# Patient Record
Sex: Female | Born: 1965 | Race: White | Hispanic: No | Marital: Single | Smoking: Never smoker
Health system: Southern US, Community
[De-identification: ages and names within clinical notes are randomized; demographics above are authoritative.]

## PROBLEM LIST (undated history)

## (undated) DIAGNOSIS — Z8719 Personal history of other diseases of the digestive system: Secondary | ICD-10-CM

## (undated) DIAGNOSIS — T7840XA Allergy, unspecified, initial encounter: Secondary | ICD-10-CM

## (undated) DIAGNOSIS — C4490 Unspecified malignant neoplasm of skin, unspecified: Secondary | ICD-10-CM

## (undated) DIAGNOSIS — K219 Gastro-esophageal reflux disease without esophagitis: Secondary | ICD-10-CM

## (undated) DIAGNOSIS — M519 Unspecified thoracic, thoracolumbar and lumbosacral intervertebral disc disorder: Secondary | ICD-10-CM

## (undated) DIAGNOSIS — E559 Vitamin D deficiency, unspecified: Principal | ICD-10-CM

## (undated) DIAGNOSIS — M9979 Connective tissue and disc stenosis of intervertebral foramina of abdomen and other regions: Secondary | ICD-10-CM

## (undated) DIAGNOSIS — E0590 Thyrotoxicosis, unspecified without thyrotoxic crisis or storm: Secondary | ICD-10-CM

## (undated) DIAGNOSIS — M5116 Intervertebral disc disorders with radiculopathy, lumbar region: Secondary | ICD-10-CM

## (undated) DIAGNOSIS — F40240 Claustrophobia: Secondary | ICD-10-CM

## (undated) DIAGNOSIS — R52 Pain, unspecified: Secondary | ICD-10-CM

## (undated) DIAGNOSIS — M545 Low back pain, unspecified: Secondary | ICD-10-CM

## (undated) DIAGNOSIS — D509 Iron deficiency anemia, unspecified: Principal | ICD-10-CM

## (undated) DIAGNOSIS — E0500 Thyrotoxicosis with diffuse goiter without thyrotoxic crisis or storm: Secondary | ICD-10-CM

## (undated) DIAGNOSIS — M5416 Radiculopathy, lumbar region: Secondary | ICD-10-CM

## (undated) DIAGNOSIS — W19XXXA Unspecified fall, initial encounter: Principal | ICD-10-CM

## (undated) DIAGNOSIS — M5126 Other intervertebral disc displacement, lumbar region: Secondary | ICD-10-CM

## (undated) DIAGNOSIS — R319 Hematuria, unspecified: Secondary | ICD-10-CM

## (undated) DIAGNOSIS — N390 Urinary tract infection, site not specified: Secondary | ICD-10-CM

## (undated) DIAGNOSIS — S0990XA Unspecified injury of head, initial encounter: Secondary | ICD-10-CM

## (undated) DIAGNOSIS — I10 Essential (primary) hypertension: Secondary | ICD-10-CM

## (undated) HISTORY — DX: Personal history of other diseases of the digestive system: Z87.19

## (undated) HISTORY — PX: CHOLECYSTECTOMY: SHX55

## (undated) HISTORY — DX: Allergy, unspecified, initial encounter: T78.40XA

## (undated) HISTORY — DX: Unspecified malignant neoplasm of skin, unspecified: C44.90

## (undated) HISTORY — DX: Gastro-esophageal reflux disease without esophagitis: K21.9

## (undated) LAB — EKG 12-LEAD

---

## 2009-10-10 LAB — URINALYSIS WITH MICROSCOPIC
Glucose, UA: NEGATIVE mg/dL
Leukocyte Esterase, Urine: NEGATIVE
Nitrite, Urine: NEGATIVE
RBC, UA: >20 /hpf — AB (ref <2)
Specific Gravity, UA: >=1.03 (ref <=1.035)
Urobilinogen, Urine: 0.2 U/dL (ref 0.2–1.0)
pH, UA: 5.5 (ref 5.0–8.5)

## 2009-10-10 LAB — LACTIC ACID: Lactic Acid: 1.3 mmol/L (ref 0.5–2.2)

## 2009-10-10 LAB — HEPATIC FUNCTION PANEL
ALT: 14 U/L (ref 10–49)
AST: 26 U/L (ref 0–33)
Albumin: 4.4 g/dL (ref 3.2–4.8)
Alkaline Phosphatase: 88 U/L (ref 45–129)
Bilirubin, Direct: 0.1 mg/dL (ref 0.0–0.2)
Total Bilirubin: 0.4 mg/dL (ref 0.3–1.2)
Total Protein: 7.5 g/dL (ref 5.7–8.2)

## 2009-10-10 LAB — BASIC METABOLIC PANEL
BUN: 14 mg/dL (ref 6–20)
CO2: 19 mmol/L — ABNORMAL LOW (ref 20–31)
Calcium: 9 mg/dL (ref 8.6–10.5)
Chloride: 104 mmol/L (ref 99–109)
Creatinine: 0.5 mg/dL (ref 0.5–1.1)
Glucose: 148 mg/dL — ABNORMAL HIGH (ref 70–110)
Potassium: 2.9 mmol/L — ABNORMAL LOW (ref 3.5–5.5)
Sodium: 135 mmol/L (ref 132–146)

## 2009-10-10 LAB — CBC
Hematocrit: 42.1 % (ref 34.0–48.0)
Hemoglobin: 13.8 g/dL (ref 11.5–15.5)
MCH: 27.3 pg (ref 26.0–35.0)
MCHC: 32.8 % (ref 32.0–34.5)
MCV: 83.3 fL (ref 80.0–99.9)
MPV: 8.5 fL (ref 7.0–12.0)
Platelets: 304 E9/L (ref 130–450)
RBC: 5.06 E12/L (ref 3.50–5.50)
RDW: 13.4 fL (ref 11.5–15.0)
WBC: 6.2 E9/L (ref 4.5–11.5)

## 2009-10-10 LAB — SERUM DRUG SCREEN
Acetaminophen Level: <1.2 ug/mL — ABNORMAL LOW (ref 10.0–30.0)
Ethanol Lvl: <10 mg/dL
Salicylate Lvl: <2 mg/dL (ref 0–29)
TCA Scrn: NOT DETECTED ng/mL (ref ?–300)

## 2009-10-10 LAB — MULTI DRUG SCREEN, URINE
Amphetamines: NOT DETECTED ng/mL
Barbiturates: NOT DETECTED ng/mL (ref ?–200)
Benzodiazepines: NOT DETECTED ng/mL (ref ?–200)
Cannabinoids: NOT DETECTED ng/mL
Cocaine Metabolite: NOT DETECTED ng/mL (ref ?–300)
Methadone: NOT DETECTED ng/mL (ref ?–300)
Opiates: NOT DETECTED ng/mL (ref ?–300)
Phencyclidine: NOT DETECTED ng/mL
Propoxyphene: NOT DETECTED ng/mL (ref ?–300)

## 2009-10-10 LAB — AMYLASE: Amylase: 48 U/L (ref 30–118)

## 2009-10-10 LAB — PREGNANCY, URINE: Pregnancy, Urine: NEGATIVE

## 2009-10-10 LAB — ICTOTEST, URINE: Ictotest: NEGATIVE

## 2009-10-10 LAB — GFR CALCULATED: Gfr Calculated: >60 mL/min/{1.73_m2} (ref >=60)

## 2009-10-10 LAB — LIPASE: Lipase: 38 U/L (ref 6–51)

## 2009-10-11 LAB — PREGNANCY-SERUM-QUAL: Preg, Serum: NEGATIVE

## 2010-03-02 LAB — COMPREHENSIVE METABOLIC PANEL
ALT: 16 U/L (ref 10–49)
AST: 24 U/L (ref 0–33)
Albumin: 3.8 g/dL (ref 3.2–4.8)
Alkaline Phosphatase: 112 U/L (ref 45–129)
BUN: 6 mg/dL (ref 6–20)
Bilirubin, Total: 0.3 mg/dL (ref 0.3–1.2)
CO2: 23 mmol/L (ref 20–31)
Calcium: 8.2 mg/dL — ABNORMAL LOW (ref 8.6–10.5)
Chloride: 107 mmol/L (ref 99–109)
Creatinine: 0.5 mg/dL (ref 0.5–1.1)
Glucose: 92 mg/dL (ref 70–110)
Potassium: 3.7 mmol/L (ref 3.5–5.5)
Sodium: 138 mmol/L (ref 132–146)
Total Protein: 6.7 g/dL (ref 5.7–8.2)

## 2010-03-02 LAB — CBC
HCT: 23.9 % — ABNORMAL LOW (ref 34.0–48.0)
HGB: 8.2 g/dL — ABNORMAL LOW (ref 11.5–15.5)
MCH: 29.3 pg (ref 26.0–35.0)
MCHC: 34.2 % (ref 32.0–34.5)
MCV: 85.7 fL (ref 80.0–99.9)
MPV: 7 fL (ref 7.0–12.0)
Platelets: 354 E9/L (ref 130–450)
RBC: 2.79 E12/L — ABNORMAL LOW (ref 3.50–5.50)
RDW: 14.2 fL (ref 11.5–15.0)
WBC: 6.5 E9/L (ref 4.5–11.5)

## 2010-03-02 LAB — GFR CALCULATED: Gfr Calculated: >60 mL/min/{1.73_m2} (ref >=60)

## 2010-03-06 ENCOUNTER — Inpatient Hospital Stay: Admit: 2010-03-06 | Discharge: 2010-03-07 | Disposition: A | Discharge status: Home / Self Care

## 2010-03-06 NOTE — ED Provider Notes (Signed)
Patient is a 44 y.o. female presenting with cough.   Cough  This is a new problem. The current episode started more than 2 days ago. The problem occurs hourly. The problem has not changed since onset.The cough is productive of sputum. The maximum temperature recorded prior to her arrival was 102 to 102.9 F. The fever has been present for 3 to 4 days. Associated symptoms include chills. Pertinent negatives include no chest pain, no ear pain, no headaches, no sore throat, no shortness of breath, no wheezing and no eye redness. Treatments tried: on Levaquin 500mg  daily for 4 days no improvement. The treatment provided no relief. She is a smoker.       Review of Systems   Constitutional: Positive for fever and chills.   HENT: Positive for congestion. Negative for ear pain, sore throat and sinus pressure.    Eyes: Negative for pain, discharge and redness.   Respiratory: Positive for cough. Negative for shortness of breath and wheezing.    Cardiovascular: Negative for chest pain.   Gastrointestinal: Negative for nausea, vomiting, diarrhea and abdominal distention.   Genitourinary: Negative for dysuria and frequency.   Musculoskeletal: Negative for back pain and arthralgias.   Skin: Negative for rash and wound.   Neurological: Negative for weakness and headaches.   Hematological: Negative for adenopathy.   All other systems reviewed and are negative.        Physical Exam   Nursing note and vitals reviewed.  Constitutional: She is oriented to person, place, and time. She appears well-developed and well-nourished.   HENT:   Head: Normocephalic and atraumatic.   Eyes: Pupils are equal, round, and reactive to light.   Neck: Normal range of motion. Neck supple.   Cardiovascular: Regular rhythm and normal heart sounds.    No murmur heard.  Pulmonary/Chest: Effort normal and breath sounds normal.   Abdominal: Soft. Bowel sounds are normal. No tenderness. She has no rebound and no guarding.   Musculoskeletal: She exhibits no  edema.   Neurological: She is alert and oriented to person, place, and time.   Skin: Skin is warm and dry.       Procedures    MDM    Labs      Radiology       EKG Interpretation      Hcg positive; pt has miscarriage Aug 10, ok for CTA CT    --------------------------------------------- PAST HISTORY ---------------------------------------------  Past Medical History: has no past medical history on file.    Past Surgical History: has past surgical history that includes Thyroid surgery and Cholecystectomy.    Social History:  reports that she has been smoking.  She does not have any smokeless tobacco history on file.  She reports that she does not currently drink alcohol or use illicit drugs.    Family History: family history is not on file.     The patient???s home medications have been reviewed.    Allergies: Codeine    -------------------------------------------------- RESULTS -------------------------------------------------    LABS:  Results for orders placed during the hospital encounter of 03/06/10   CBC WITH AUTO DIFFERENTIAL       Component Value Range    WBC 8.3  4.5 - 11.5 (E9/L)    RBC 2.73 (*) 3.50 - 5.50 (E12/L)    HGB 7.8 (*) 11.5 - 15.5 (g/dL)    HCT 56.4 (*) 33.2 - 48.0 (%)    MCV 84.5  80.0 - 99.9 (fL)    MCH 28.6  26.0 - 35.0 (pg)    MCHC 33.9  32.0 - 34.5 (%)    RDW 14.0  11.5 - 15.0 (fL)    Platelets 311  130 - 450 (E9/L)    MPV 7.2  7.0 - 12.0 (fL)    Absolute Neut # 6.76  1.80 - 7.30 (E9/L)    Absolute Lymph # 1.17 (*) 1.50 - 4.00 (E9/L)    Absolute Mono # 0.38  0.10 - 0.95 (E9/L)    Absolute Eos # 0.00 (*) 0.05 - 0.50 (E9/L)    Absolute Baso # 0.01  0.00 - 0.20 (E9/L)    Seg Neutrophils 81 (*) 43 - 80 (%)    Lymphocytes 14 (*) 20 - 42 (%)    Monocytes 5  2 - 12 (%)    Eosinophils 0  0 - 6 (%)    Basophils 0  0 - 2 (%)   COMPREHENSIVE METABOLIC PANEL       Component Value Range    Sodium 130 (*) 132 - 146 (mmol/L)    Potassium 2.6 (*) 3.5 - 5.5 (mmol/L)    Chloride 99  99 - 109 (mmol/L)    CO2 21   20 - 31 (mmol/L)    Glucose 114 (*) 70 - 110 (mg/dL)    Bun 6  6 - 20 (mg/dL)    Creatinine 0.5  0.5 - 1.1 (mg/dL)    Calcium 7.8 (*) 8.6 - 10.5 (mg/dL)    Total Protein 6.5  5.7 - 8.2 (g/dL)    Albumin 3.6  3.2 - 4.8 (g/dL)    Alkaline Phosphatase 82  45 - 129 (U/L)    AST 19  0 - 33 (U/L)    Bilirubin, Total 0.4  0.3 - 1.2 (mg/dL)    ALT 9 (*) 10 - 49 (U/L)    Hemolysis NONE     AMYLASE       Component Value Range    Amylase 37  30 - 118 (U/L)   LIPASE       Component Value Range    Lipase 25  6 - 51 (U/L)   D-DIMER, QUANTITATIVE       Component Value Range    D-Dimer, Quant 250-500 (*)    LACTIC ACID, PLASMA       Component Value Range    Lactic Acid 1.5  0.5 - 2.2 (mmol/L)   PROTIME-INR       Component Value Range    Protime 17.6 (*) 9.8 - 12.5 (secs.)    INR 2.2     APTT       Component Value Range    PTT 40.1 (*) 23.3 - 32.4 (secs.)   URINALYSIS WITH MICROSCOPIC       Component Value Range    Color, UA YELLOW  YELLOW     Clarity, UA SL CLOUDY  CLEAR     Glucose, UA NEGATIVE  NEGATIVE (mg/dL)    Bilirubin, UA NEGATIVE  NEGATIVE     Ketones, UA NEGATIVE  NEGATIVE (mg/dL)    Blood, UA LARGE (*) NEGATIVE     pH, UA 6.0  5.0 - 8.5     Protein, UA NEGATIVE  NEGATIVE (mg/dL)    Urobilinogen, UA 0.2  0.2 - 1.0 (E.U./dL)    Nitrite, UA NEGATIVE  NEGATIVE     Leukocyte Esterase UA LARGE (*) NEGATIVE     WBC, UA >20 (*) <5 (/hpf)    RBC, UA 1-3  <2 (/hpf)  Bacteria, UA MODERATE (*) NONE (/hpf)    Amorphous, UA FEW      Squam Epithel, UA MODERATE     PREGNANCY, URINE       Component Value Range    Preg Test, Ur POSITIVE  NEGATIVE     Specific Gravity, UA 1.020  <=1.035    GFR CALCULATED       Component Value Range    Gfr Calculated >60  >=60 (ml/mn/1.73)       RADIOLOGY:  XR CHEST PA AND LATERAL    Final Result:        CT ANGIOGRAM CHEST W WO CONTRAST    Final Result:        XR CHEST PA OR AP    (Results Pending)       EKG:  This EKG is signed and interpreted by me.    Rate:   Rhythm: Sinus  Interpretation: non-specific  EKG  Comparison: was normal      ------------------------- NURSING NOTES AND VITALS REVIEWED ---------------------------   The nursing notes within the ED encounter and vital signs as below have been reviewed.   BP 128/68   Pulse 82   Temp(Src) 98.3 ??F (36.8 ??C) (Oral)   Resp 16   Ht 5\' 2"  (1.575 m)   Wt 150 lb (68.04 kg)   BMI 27.44 kg/m2   SpO2 97%   LMP 03/06/2010  Oxygen Saturation Interpretation: Normal      ------------------------------------------ PROGRESS NOTES ------------------------------------------     Consultations:      Counseling:   I have spoken with the patient and discussed today???s results, in addition to providing specific details for the plan of care and counseling regarding the diagnosis and prognosis.  Their questions are answered at this time and they are agreeable with the plan.      --------------------------------- ADDITIONAL PROVIDER NOTES ---------------------------------         This patient's ED course included: a personal history and physicial eaxmination and re-evaluation prior to disposition    This patient has remained hemodynamically stable and been closely monitored during their ED course.    Critical Care:     Orpha Bur, DO  03/07/10 [phone]

## 2010-03-07 LAB — COMPREHENSIVE METABOLIC PANEL
ALT: 9 U/L — ABNORMAL LOW (ref 10–49)
AST: 19 U/L (ref 0–33)
Albumin: 3.6 g/dL (ref 3.2–4.8)
Alkaline Phosphatase: 82 U/L (ref 45–129)
BUN: 6 mg/dL (ref 6–20)
Bilirubin, Total: 0.4 mg/dL (ref 0.3–1.2)
CO2: 21 mmol/L (ref 20–31)
Calcium: 7.8 mg/dL — ABNORMAL LOW (ref 8.6–10.5)
Chloride: 99 mmol/L (ref 99–109)
Creatinine: 0.5 mg/dL (ref 0.5–1.1)
Glucose: 114 mg/dL — ABNORMAL HIGH (ref 70–110)
Potassium: 2.6 mmol/L — CL (ref 3.5–5.5)
Sodium: 130 mmol/L — ABNORMAL LOW (ref 132–146)
Total Protein: 6.5 g/dL (ref 5.7–8.2)

## 2010-03-07 LAB — GFR CALCULATED: Gfr Calculated: >60 mL/min/{1.73_m2} (ref >=60)

## 2010-03-07 LAB — URINALYSIS WITH MICROSCOPIC
Bilirubin, Urine: NEGATIVE
Glucose, UA: NEGATIVE mg/dL
Ketones, Urine: NEGATIVE mg/dL
Nitrite, Urine: NEGATIVE
Protein, UA: NEGATIVE mg/dL
Urobilinogen, Urine: 0.2 E.U./dL (ref 0.2–1.0)
WBC, UA: >20 /hpf — AB (ref <5)
pH, UA: 6 (ref 5.0–8.5)

## 2010-03-07 LAB — CBC WITH AUTO DIFFERENTIAL
Absolute Baso #: 0.01 E9/L (ref 0.00–0.20)
Absolute Eos #: 0 E9/L — ABNORMAL LOW (ref 0.05–0.50)
Absolute Lymph #: 1.17 E9/L — ABNORMAL LOW (ref 1.50–4.00)
Absolute Mono #: 0.38 E9/L (ref 0.10–0.95)
Absolute Neut #: 6.76 E9/L (ref 1.80–7.30)
Basophils: 0 % (ref 0–2)
Eosinophils %: 0 % (ref 0–6)
HCT: 23.1 % — ABNORMAL LOW (ref 34.0–48.0)
HGB: 7.8 g/dL — ABNORMAL LOW (ref 11.5–15.5)
Lymphocytes: 14 % — ABNORMAL LOW (ref 20–42)
MCH: 28.6 pg (ref 26.0–35.0)
MCHC: 33.9 % (ref 32.0–34.5)
MCV: 84.5 fL (ref 80.0–99.9)
MPV: 7.2 fL (ref 7.0–12.0)
Monocytes: 5 % (ref 2–12)
Platelets: 311 E9/L (ref 130–450)
RBC: 2.73 E12/L — ABNORMAL LOW (ref 3.50–5.50)
RDW: 14 fL (ref 11.5–15.0)
Seg Neutrophils: 81 % — ABNORMAL HIGH (ref 43–80)
WBC: 8.3 E9/L (ref 4.5–11.5)

## 2010-03-07 LAB — PROTIME-INR
INR: 2.2
Protime: 17.6 secs. — ABNORMAL HIGH (ref 9.8–12.5)

## 2010-03-07 LAB — LACTIC ACID: Lactic Acid: 1.5 mmol/L (ref 0.5–2.2)

## 2010-03-07 LAB — LIPASE: Lipase: 25 U/L (ref 6–51)

## 2010-03-07 LAB — D-DIMER, QUANTITATIVE

## 2010-03-07 LAB — PREGNANCY, URINE
Pregnancy, Urine: POSITIVE
Specific Gravity, UA: 1.02 (ref <=1.035)

## 2010-03-07 LAB — APTT: PTT: 40.1 secs. — ABNORMAL HIGH (ref 23.3–32.4)

## 2010-03-07 LAB — AMYLASE: Amylase: 37 U/L (ref 30–118)

## 2010-03-07 MED ORDER — SODIUM CHLORIDE 0.9 % IV SOLN
0.9 % | Freq: Once | INTRAVENOUS | Status: DC
Start: 2010-03-07 — End: 2010-03-07
  Administered 2010-03-07 (×2): via INTRAVENOUS

## 2010-03-07 MED ORDER — ACETAMINOPHEN 325 MG PO TABS
325 MG | Freq: Once | ORAL | Status: AC
Start: 2010-03-07 — End: 2010-03-06
  Administered 2010-03-07: 02:00:00 via ORAL

## 2010-03-07 MED ORDER — IPRATROPIUM-ALBUTEROL 0.5-2.5 (3) MG/3ML IN SOLN
Freq: Once | RESPIRATORY_TRACT | Status: AC
Start: 2010-03-07 — End: 2010-03-06
  Administered 2010-03-07: 02:00:00 via RESPIRATORY_TRACT

## 2010-03-07 MED ORDER — POTASSIUM CHLORIDE CRYS ER 10 MEQ PO TBCR
10 MEQ | Freq: Once | ORAL | Status: AC
Start: 2010-03-07 — End: 2010-03-07
  Administered 2010-03-07: 04:00:00 via ORAL

## 2010-03-07 MED ORDER — IOVERSOL 68 % IV SOLN
68 % | Freq: Once | INTRAVENOUS | Status: AC | PRN
Start: 2010-03-07 — End: 2010-03-07
  Administered 2010-03-07: 07:00:00 via INTRAVENOUS

## 2010-03-07 MED ADMIN — vancomycin 1000 mg IVPB in D5W 250ml. addavial: INTRAVENOUS | @ 04:00:00 | NDC 23360015250

## 2010-03-07 MED ADMIN — cefepime (MAXIPIME) 2 g IVPB minibag: INTRAVENOUS | @ 02:00:00 | NDC 60505068104

## 2010-03-07 MED FILL — SODIUM CHLORIDE 0.9 % IJ SOLN: 0.9 % | INTRAMUSCULAR | Qty: 20

## 2010-03-07 MED FILL — IPRATROPIUM-ALBUTEROL 0.5-2.5 (3) MG/3ML IN SOLN: RESPIRATORY_TRACT | Qty: 3

## 2010-03-07 MED FILL — KLOR-CON M10 10 MEQ PO TBCR: 10 MEQ | ORAL | Qty: 4

## 2010-03-07 MED FILL — SODIUM CHLORIDE 0.9 % IV SOLN: 0.9 % | INTRAVENOUS | Qty: 1000

## 2010-03-07 MED FILL — VANCOMYCIN HCL 1000 MG IV SOLR: 1000 MG | INTRAVENOUS | Qty: 1

## 2010-03-07 MED FILL — MAPAP 325 MG PO TABS: 325 MG | ORAL | Qty: 2

## 2010-03-07 MED FILL — CEFAZOLIN SODIUM 1 G IJ SOLR: 1 g | INTRAMUSCULAR | Qty: 2000

## 2010-03-07 NOTE — ED Notes (Signed)
Pt to ct      Thurnell Garbe, RN  03/07/10 [phone]

## 2010-03-07 NOTE — ED Notes (Signed)
Pt to ct     Oren Bracket, RN  03/07/10 [phone]

## 2010-03-07 NOTE — Discharge Instructions (Signed)
Pleurisy     Pleurisy is an inflammation and swelling of the lining of the lungs. It usually is the result of an underlying infection or other disease. Because of this inflammation, it hurts to breathe. It is aggravated by coughing or deep breathing. The primary goal in treating pleurisy is to diagnose and treat the condition that caused it.      HOME CARE INSTRUCTIONS   Only take over-the-counter or prescription medicines for pain, discomfort, or fever as directed by your caregiver.    If  medications which kill germs (antibiotics) were prescribed, take the entire course.  Even if you are feeling better, you need to take them.   Use a cool mist vaporizer to help loosen secretions. This is so the secretions can be coughed up more easily.     SEEK MEDICAL CARE IF:   An oral temperature above 102 F (38.9 C) develops, or as your caregiver suggests.    Your pain is not controlled with medication or is increasing.   You have an increase in pus like (purulent) secretions brought up with coughing.  SEEK IMMEDIATE MEDICAL CARE IF:   You have blue or dark lips, fingernails, or toenails.   You begin coughing up blood.   You have increased difficulty breathing.   You have continuing pain unrelieved by medicine or lasting more than one week.   You have pain that radiates into your neck, arms, or jaw.   You develop increased shortness of breath or wheezing.   You develop fever, rash, vomiting, fainting, or other serious complaints.     Document Released: 07/01/2005  Document Re-Released: 12/18/2007  [facility] Patient Information 2011 [place].

## 2010-03-30 DIAGNOSIS — J309 Allergic rhinitis, unspecified: Secondary | ICD-10-CM | POA: Insufficient documentation

## 2010-09-24 LAB — CBC WITH AUTO DIFFERENTIAL
Absolute Baso #: 0.05 E9/L (ref 0.00–0.20)
Absolute Eos #: 0.11 E9/L (ref 0.05–0.50)
Absolute Lymph #: 1.36 E9/L — ABNORMAL LOW (ref 1.50–4.00)
Absolute Mono #: 0.33 E9/L (ref 0.10–0.95)
Absolute Neut #: 3.36 E9/L (ref 1.80–7.30)
Basophils: 1 % (ref 0–2)
Eosinophils %: 2 % (ref 0–6)
HCT: 39.7 % (ref 34.0–48.0)
HGB: 13.2 g/dL (ref 11.5–15.5)
Lymphocytes: 26 % (ref 20–42)
MCH: 28.4 pg (ref 26.0–35.0)
MCHC: 33.3 % (ref 32.0–34.5)
MCV: 85.2 fL (ref 80.0–99.9)
MPV: 8.8 fL (ref 7.0–12.0)
Monocytes: 6 % (ref 2–12)
Platelets: 263 E9/L (ref 130–450)
RBC: 4.66 E12/L (ref 3.50–5.50)
RDW: 15.1 fL — ABNORMAL HIGH (ref 11.5–15.0)
Seg Neutrophils: 65 % (ref 43–80)
WBC: 5.2 E9/L (ref 4.5–11.5)

## 2010-09-24 LAB — MISCELLANEOUS SENDOUT

## 2010-09-24 LAB — ANISOCYTOSIS

## 2010-09-24 LAB — RPR: RPR: NONREACTIVE

## 2010-09-25 LAB — HEPATITIS PANEL, ACUTE
Hep A IgM: NONREACTIVE
Hep B Core Ab, IgM: NONREACTIVE
Hepatitis B Surface Ag: NONREACTIVE
Hepatitis C Ab: NONREACTIVE

## 2010-09-25 LAB — BASIC METABOLIC PANEL
BUN: 10 mg/dL (ref 6–20)
CO2: 28 mmol/L (ref 20–31)
Calcium: 9.3 mg/dL (ref 8.6–10.5)
Chloride: 108 mmol/L (ref 99–109)
Creatinine: 0.5 mg/dL (ref 0.5–1.1)
Glucose: 90 mg/dL (ref 70–110)
Potassium: 3.7 mmol/L (ref 3.5–5.5)
Sodium: 142 mmol/L (ref 132–146)

## 2010-09-25 LAB — LIPID PANEL
Cholesterol: 152 mg/dL (ref 0–199)
HDL: 50 mg/dL (ref >40.0)
LDL Calculated: 78 mg/dL (ref 0–99)
Triglycerides: 120 mg/dL (ref 0–149)

## 2010-09-25 LAB — HEPATIC FUNCTION PANEL
ALT: 13 U/L (ref 10–49)
AST: 22 U/L (ref 0–33)
Albumin: 4.2 g/dL (ref 3.2–4.8)
Alkaline Phosphatase: 122 U/L (ref 45–129)
Bilirubin, Direct: 0.1 mg/dL (ref 0.0–0.2)
Bilirubin, Total: 0.2 mg/dL — ABNORMAL LOW (ref 0.3–1.2)
Total Protein: 7.6 g/dL (ref 5.7–8.2)

## 2010-09-25 LAB — HEPATITIS B SURFACE ANTIBODY: Hep B S Ab: REACTIVE

## 2010-09-25 LAB — TOXOPLASMA GONDII ANTIBODY, IGG

## 2010-09-25 LAB — GFR CALCULATED: Gfr Calculated: >60 mL/min/{1.73_m2} (ref >=60)

## 2010-09-26 LAB — HEPATITIS A ANTIBODY, TOTAL: Hep A Total Ab: NEGATIVE

## 2010-09-26 LAB — VITAMIN D 25 HYDROXY: Vit D, 25-Hydroxy: 11 ng/mL — ABNORMAL LOW (ref 30–80)

## 2010-09-29 LAB — HIV RNA, QUANTITATIVE, PCR

## 2010-12-12 LAB — RPR: RPR: NONREACTIVE

## 2010-12-13 LAB — FTA ANTIBODIES, IGG AND IGM: Syphilis Treponemal Ab: NONREACTIVE

## 2011-01-15 LAB — CBC WITH AUTO DIFFERENTIAL
Absolute Eos #: 0.09 E9/L (ref 0.05–0.50)
Absolute Lymph #: 1.83 E9/L (ref 1.50–4.00)
Absolute Mono #: 0.34 E9/L (ref 0.10–0.95)
Absolute Neut #: 3.97 E9/L (ref 1.80–7.30)
Basophils Absolute: 0.04 E9/L (ref 0.00–0.20)
Basophils: 1 % (ref 0–2)
Eosinophils %: 2 % (ref 0–6)
Hematocrit: 38.9 % (ref 34.0–48.0)
Hemoglobin: 13.2 g/dL (ref 11.5–15.5)
Lymphocytes: 29 % (ref 20–42)
MCH: 27.8 pg (ref 26.0–35.0)
MCHC: 33.9 % (ref 32.0–34.5)
MCV: 82.1 fL (ref 80.0–99.9)
MPV: 8.5 fL (ref 7.0–12.0)
Monocytes: 6 % (ref 2–12)
Platelets: 303 E9/L (ref 130–450)
RBC: 4.74 E12/L (ref 3.50–5.50)
RDW: 16.5 fL — ABNORMAL HIGH (ref 11.5–15.0)
Seg Neutrophils: 63 % (ref 43–80)
WBC: 6.3 E9/L (ref 4.5–11.5)

## 2011-01-15 LAB — HIV RNA, QUANTITATIVE, PCR

## 2011-01-15 LAB — BASIC METABOLIC PANEL
BUN: 10 mg/dL (ref 6–20)
CO2: 24 mmol/L (ref 20–31)
Calcium: 9.1 mg/dL (ref 8.6–10.5)
Chloride: 111 mmol/L — ABNORMAL HIGH (ref 99–109)
Creatinine: 0.6 mg/dL (ref 0.5–1.1)
Glucose: 102 mg/dL (ref 70–110)
Potassium: 3.9 mmol/L (ref 3.5–5.5)
Sodium: 142 mmol/L (ref 132–146)

## 2011-01-15 LAB — HEPATIC FUNCTION PANEL
ALT: 11 U/L (ref 10–49)
AST: 18 U/L (ref 0–33)
Albumin: 4.4 g/dL (ref 3.2–4.8)
Alkaline Phosphatase: 141 U/L — ABNORMAL HIGH (ref 45–129)
Bilirubin, Direct: 0.1 mg/dL (ref 0.0–0.2)
Total Bilirubin: 0.2 mg/dL — ABNORMAL LOW (ref 0.3–1.2)
Total Protein: 7.1 g/dL (ref 5.7–8.2)

## 2011-01-15 LAB — MISCELLANEOUS SENDOUT

## 2011-01-15 LAB — VITAMIN D 25 HYDROX, D2 & D3: Vitamin D2 And D3, Total: 16 ng/mL — ABNORMAL LOW (ref 30–100)

## 2011-01-15 LAB — ANISOCYTOSIS

## 2011-01-15 LAB — GFR CALCULATED: Gfr Calculated: >60 mL/min/{1.73_m2} (ref >=60)

## 2011-02-19 LAB — PREGNANCY-SERUM-QUAL: Preg, Serum: POSITIVE

## 2011-04-08 LAB — POCT URINE QUALITATIVE DIPSTICK GLUCOSE: Glucose, UA POC: NEGATIVE

## 2011-04-08 LAB — POCT URINE QUALITATIVE DIPSTICK PROTEIN: Protein, UA: NEGATIVE

## 2011-04-12 NOTE — Telephone Encounter (Signed)
the patient was called for pappa a results, no answer left message to call office back

## 2011-04-12 NOTE — Telephone Encounter (Signed)
Error

## 2011-04-16 LAB — HIV RNA, QUANTITATIVE, PCR

## 2011-04-16 LAB — MISCELLANEOUS SENDOUT

## 2011-05-20 LAB — POCT URINE QUALITATIVE DIPSTICK PROTEIN: Protein, UA: NEGATIVE

## 2011-05-20 LAB — POCT URINE QUALITATIVE DIPSTICK GLUCOSE: Glucose, UA POC: NEGATIVE

## 2011-05-20 NOTE — Progress Notes (Signed)
Pt unable to void

## 2011-07-03 ENCOUNTER — Inpatient Hospital Stay
Admit: 2011-07-03 | Discharge: 2011-07-03 | Disposition: A | Discharge status: Home / Self Care | Attending: Emergency Medicine

## 2011-07-03 MED ORDER — IPRATROPIUM-ALBUTEROL 0.5-2.5 (3) MG/3ML IN SOLN
Freq: Once | RESPIRATORY_TRACT | Status: AC
Start: 2011-07-03 — End: 2011-07-03
  Administered 2011-07-03: 17:00:00 via RESPIRATORY_TRACT

## 2011-07-03 MED ORDER — AZITHROMYCIN 250 MG PO TABS
250 MG | PACK | ORAL | Status: AC
Start: 2011-07-03 — End: 2011-07-13

## 2011-07-03 MED ORDER — ALBUTEROL SULFATE HFA 108 (90 BASE) MCG/ACT IN AERS
10890 (90 Base) MCG/ACT | RESPIRATORY_TRACT | Status: DC | PRN
Start: 2011-07-03 — End: 2014-11-10

## 2011-07-03 MED FILL — IPRATROPIUM-ALBUTEROL 0.5-2.5 (3) MG/3ML IN SOLN: 0.5-2.5 (3) MG/3ML | RESPIRATORY_TRACT | Qty: 3

## 2011-07-03 NOTE — ED Notes (Signed)
Patient presents with the C/O a persistent cough. Patient states that she was coughing all night and couldn't get any sleep. Patient also states that she is just over 6 months pregnant.     Elvia Collum, RN  07/03/11 [phone]

## 2011-07-03 NOTE — ED Notes (Signed)
The mild wheezing at end of inspiration has cleared up after breathing treatment.     Elvia Collum, RN  07/03/11 [phone]

## 2011-07-03 NOTE — Discharge Instructions (Signed)
Upper Respiratory Infection (URI), Adult  An upper respiratory infection (URI) is also known as the common cold. It is often caused by a virus. Colds are easily spread (contagious). You can pass it to others by touch or by drinking out of the same glass. You may have:   A runny nose.   Sneezing.    Coughing.   A stuffy nose (nasal congestion).   A sinus infection.   A sore throat.   A scratchy voice (hoarseness).    You can also have:   Tiredness (fatigue).   Muscle aches.   A headache.   A mild fever.    Usually, you get well in a week or two. Your doctor will know if you have a URI by talking to you and examining you.  HOME CARE   Inhale heated mist or steam (vaporizer or shower).    Sip chicken soup.    Get plenty of rest.    Use lozenges for throat comfort.    Rinse your mouth (gargle) with warm water or salt water (1/4 teaspoon salt in 8 ounces of water).    Only take medicine as told by your doctor.    Drink enough water and fluids to keep your pee (urine) clear or pale yellow.    Rest as needed.    Return to work when your temperature has returned to normal or as told by your doctor. Use a face mask and wash your hands to stop your cold from spreading.   GET HELP IF:   You have a temperature by mouth above 102 F (38.9 C).    After the first few days, you feel you are getting worse, not better.    You have questions about your medicine.   GET HELP RIGHT AWAY IF:   You have a temperature by mouth above 102 F (38.9 C), not controlled by medicine.    You have a bad or lasting headache, ear pain, sinus pain, or chest pain.    You have trouble breathing or get short of breath.    You have a lasting cough, cough up blood, or have a change in your usual mucus.    You have sore muscles, a stiff neck, or a very bad headache, not controlled with medicine.   MAKE SURE YOU:   Understand these instructions.    Will watch your condition.    Will get help right away if you are not doing  well or get worse.   Document Released: 12/18/2007 Document Re-Released: 09/25/2009  [facility] Patient Information 2012 [place].  Cough  The body has a normal cough reflex. This helps expel mucous secretions and irritants from the lung and airway. Coughing helps to protect you from pneumonia. Most coughs are caused by virus infections. These often take 2 to 3 weeks to clear up. Cough spasms are periods of continuous coughing that go on for several minutes. If a cough can be controlled with medicine, and it clears up in 2 to 3 weeks, no special studies or treatment is usually needed.  A persistent cough lasting longer than 3 to 4 weeks requires medical evaluation. X-rays and other tests may be needed to determine the cause. A chronic cough is most often due to smoking, post-nasal drip from sinus disease, or asthma. Esophageal reflux disease or GERD can also lead to a chronic cough. ACE inhibitor blood pressure drugs may also cause a cough. Some infections like whooping cough can cause a persistent cough that  lasts for weeks.  Treatment of cough includes measures to loosen the cough and thin the mucus. Warm liquids, cough drops, and non prescription cough medicine will help reduce dry hacking cough. Use a humidifier if necessary to moisten the air in your room. Some cough medicines also have antihistamines, decongestants, or alcohol in them, but there is no proof that any of these help control cough.  Cough suppressants may be used as directed by your caregiver. Keep in mind that coughing helps clear mucus and infection out of the respiratory tract. It is best to use cough suppressants only when rest is needed. For children under the age of 4 years, use cough suppressants only as directed by your child's caregiver.  Prescription cough medicine or those with dextromethorphan (DM) should be reserved for dry coughs that prevent sleep or cause spasms or chest pain. Avoid any exposure to cigarette smoke because this  will worsen the cough or make it last much longer.   SEEK IMMEDIATE MEDICAL CARE IF YOU OR YOUR CHILD DEVELOPS:   Increased difficulty breathing.    A high fever or severe chest pain.    A cough which has not improved within 3 weeks.   Document Released: 07/01/2005 Document Re-Released: 09/25/2009  [facility] Patient Information 2012 [place].  Smoking Cessation  This document explains the best ways for you to quit smoking and new treatments to help. It lists new medicines that can double or triple your chances of quitting and quitting for good. It also considers ways to avoid relapses and concerns you may have about quitting, including weight gain.  NICOTINE: A POWERFUL ADDICTION  If you have tried to quit smoking, you know how hard it can be. It is hard because nicotine is a very addictive drug. For some people, it can be as addictive as heroin or cocaine. Usually, people make 2 or 3 tries, or more, before finally being able to quit. Each time you try to quit, you can learn about what helps and what hurts. Quitting takes hard work and a lot of effort, but you can quit smoking.  QUITTING SMOKING IS ONE OF THE MOST IMPORTANT THINGS YOU WILL EVER DO:   You will live longer, feel better, and live better.    The impact on your body of quitting smoking is felt almost immediately:    Within 20 minutes, blood pressure decreases. Pulse returns to its normal level.    After 8 hours, carbon monoxide levels in the blood return to normal. Oxygen level increases.    After 24 hours, chance of heart attack starts to decrease. Breath, hair, and body stop smelling like smoke.    After 48 hours, damaged nerve endings begin to recover. Sense of taste and smell improve.    After 72 hours, the body is virtually free of nicotine. Bronchial tubes relax and breathing becomes easier.    After 2 to 12 weeks, lungs can hold more air. Exercise becomes easier and circulation improves.    Quitting will lower your chance of  having a heart attack, stroke, cancer, or lung disease:    After 1 year, the risk of coronary heart disease is cut in half.    After 5 years, the risk of stroke falls to the same as a nonsmoker.    After 10 years, the risk of lung cancer is cut in half and the risk of other cancers decreases significantly.    After 15 years, the risk of coronary heart disease drops, usually  to the level of a nonsmoker.    If you are pregnant, quitting smoking will improve your chances of having a healthy baby.    The people you live with, especially your children, will be healthier.    You will have extra money to spend on things other than cigarettes.   FIVE KEYS TO QUITTING  Studies have shown that these 5 steps will help you quit smoking and quit for good. You have the best chances of quitting if you use them together:  1. Get ready.   2. Get support and encouragement.   3. Learn new skills and behaviors.   4. Get medicine to reduce your nicotine addiction and use it correctly.   5. Be prepared for relapse or difficult situations. Be determined to continue trying to quit, even if you do not succeed at first.   1. GET READY   Set a quit date.    Change your environment.    Get rid of ALL cigarettes, ashtrays, matches, and lighters in your home, car, and place of work.    Do not let people smoke in your home.    Review your past attempts to quit. Think about what worked and what did not.    Once you quit, do not smoke. NOT EVEN A PUFF!   2. GET SUPPORT AND ENCOURAGEMENT  Studies have shown that you have a better chance of being successful if you have help. You can get support in many ways.   Tell your family, friends, and coworkers that you are going to quit and need their support. Ask them not to smoke around you.    Talk to your caregivers (doctor, dentist, nurse, pharmacist, psychologist, and/or smoking counselor).    Get individual, group, or telephone counseling and support. The more counseling you have, the  better your chances are of quitting. Programs are available at [employer] and health centers. Call your local health department for information about programs in your area.    Spiritual beliefs and practices may help some smokers quit.    Quit meters are Insurance underwriter that keep track of quit statistics, such as amount of "quit-time," cigarettes not smoked, and money saved.    Many smokers find one or more of the many self-help books available useful in helping them quit and stay off tobacco.   3. LEARN NEW SKILLS AND BEHAVIORS   Try to distract yourself from urges to smoke. Talk to someone, go for a walk, or occupy your time with a task.    When you first try to quit, change your routine. Take a different route to work. Drink tea instead of coffee. Eat breakfast in a different place.    Do something to reduce your stress. Take a hot bath, exercise, or read a book.    Plan something enjoyable to do every day. Reward yourself for not smoking.    Explore interactive web-based programs that specialize in helping you quit.   4. GET MEDICINE AND USE IT CORRECTLY  Medicines can help you stop smoking and decrease the urge to smoke. Combining medicine with the above behavioral methods and support can quadruple your chances of successfully quitting smoking.  The [place] Food and Drug Administration (FDA) has approved 7 medicines to help you quit smoking. These medicines fall into 3 categories.   Nicotine replacement therapy (delivers nicotine to your body without the negative effects and risks of smoking):    Nicotine gum: Available over-the-counter.  Nicotine lozenges: Available over-the-counter.    Nicotine inhaler: Available by prescription.    Nicotine nasal spray: Available by prescription.    Nicotine skin patches (transdermal): Available by prescription and over-the-counter.    Antidepressant medicine (helps people abstain from smoking, but how this works is unknown):     Bupropion sustained-release (SR) tablets: Available by prescription.    Nicotinic receptor partial agonist (simulates the effect of nicotine in your brain):    Varenicline tartrate tablets: Available by prescription.    Ask your caregiver for advice about which medicines to use and how to use them. Carefully read the information on the package.    Everyone who is trying to quit may benefit from using a medicine. If you are pregnant or trying to become pregnant, nursing an infant, you are under age 15, or you smoke fewer than 10 cigarettes per day, talk to your caregiver before taking any nicotine replacement medicines.    You should stop using a nicotine replacement product and call your caregiver if you experience nausea, dizziness, weakness, vomiting, fast or irregular heartbeat, mouth problems with the lozenge or gum, or redness or swelling of the skin around the patch that does not go away.    Do not use any other product containing nicotine while using a nicotine replacement product.    Talk to your caregiver before using these products if you have diabetes, heart disease, asthma, stomach ulcers, you had a recent heart attack, you have high blood pressure that is not controlled with medicine, a history of irregular heartbeat, or you have been prescribed medicine to help you quit smoking.   5. BE PREPARED FOR RELAPSE OR DIFFICULT SITUATIONS   Most relapses occur within the first 3 months after quitting. Do not be discouraged if you start smoking again. Remember, most people try several times before they finally quit.    You may have symptoms of withdrawal because your body is used to nicotine. You may crave cigarettes, be irritable, feel very hungry, cough often, get headaches, or have difficulty concentrating.    The withdrawal symptoms are only temporary. They are strongest when you first quit, but they will go away within 10 to 14 days.   Here are some difficult situations to watch  for:   Alcohol. Avoid drinking alcohol. Drinking lowers your chances of successfully quitting.    Caffeine. Try to reduce the amount of caffeine you consume. It also lowers your chances of successfully quitting.    Other smokers. Being around smoking can make you want to smoke. Avoid smokers.    Weight gain. Many smokers will gain weight when they quit, usually less than 10 pounds. Eat a healthy diet and stay active. Do not let weight gain distract you from your main goal, quitting smoking. Some medicines that help you quit smoking may also help delay weight gain. You can always lose the weight gained after you quit.    Bad mood or depression. There are a lot of ways to improve your mood other than smoking.   If you are having problems with any of these situations, talk to your caregiver.  SPECIAL SITUATIONS OR CONDITIONS  Studies suggest that everyone can quit smoking. Your situation or condition can give you a special reason to quit.   Pregnant women/New mothers: By quitting, you protect your baby's health and your own.    Hospitalized patients: By quitting, you reduce health problems and help healing.    Heart attack patients: By quitting, you reduce  your risk of a second heart attack.    Lung, head, and neck cancer patients: By quitting, you reduce your chance of a second cancer.    Parents of children and adolescents: By quitting, you protect your children from illnesses caused by secondhand smoke.   QUESTIONS TO THINK ABOUT  Think about the following questions before you try to stop smoking. You may want to talk about your answers with your caregiver.   Why do you want to quit?    If you tried to quit in the past, what helped and what did not?    What will be the most difficult situations for you after you quit? How will you plan to handle them?    Who can help you through the tough times? Your family? Friends? Caregiver?    What pleasures do you get from smoking? What ways can you still get  pleasure if you quit?   Here are some questions to ask your caregiver:   How can you help me to be successful at quitting?    What medicine do you think would be best for me and how should I take it?    What should I do if I need more help?    What is smoking withdrawal like? How can I get information on withdrawal?   Quitting takes hard work and a lot of effort, but you can quit smoking.  FOR MORE INFORMATION  Smokefree.gov (Inrails.tn) provides free, accurate, evidence-based information and professional assistance to help support the immediate and long-term needs of people trying to quit smoking.  Document Released: 06/25/2001 Document Re-Released: 12/19/2009  [facility] Patient Information 2012 [place].

## 2011-07-03 NOTE — ED Provider Notes (Signed)
HPI: Maria Valdez is a 45 y.o. female with a past medical history of  has a past medical history of HIV infection.presenting with complaints of a cough. The patient states that these symptoms began gradually. The history is obtained from the patient. The patient states that he has had some subjective chills at home. Patient does complain of a mild cough associated with it that is nonproductive. Patient denies excessive fatigue or sleeping greater than 18 hours a day. Patient denies exposure to mononucleosis. The patient denies any abdominal pain, left upper quadrant fullness, or early satiety. The patient also denies difficulty breathing, hemoptysis, neck pain/stiffness, or blurry vision. Sx have persisted and are mildly worse which is what prompted the visit today. no exposure to sick contacts. Patient states her symptoms have been present for the past 3 days. She denies any nausea, vomiting, diarrhea. She does state that she's approximately 6 months pregnant. She denies any abdominal cramping, bleeding or spotting. She states she's had good fetal  Movement.       ROS:   Unless otherwise stated in this report or unable to obtain because of the patient's clinical or mental status as evidenced by the medical record, this patients's positive and negative responses for Review of Systems, constitutional, psych, eyes, ENT, cardiovascular, respiratory, gastrointestinal, neurological, genitourinary, musculoskeletal, integument systems and systems related to the presenting problem are either stated in the preceding or were not pertinent or were negative for the symptoms and/or complaints related to the medical problem.      --------------------------------------------- PAST HISTORY ---------------------------------------------  Past Medical History:  has a past medical history of HIV infection.    Past Surgical History:  has past surgical history that includes Thyroid surgery and Cholecystectomy.    Social History:   reports that she has been smoking.  She has never used smokeless tobacco. She reports that she does not drink alcohol or use illicit drugs.    Family History: family history is not on file.     The patient's home medications have been reviewed.    Allergies: Sulfa drugs and Codeine    ------------------------- NURSING NOTES AND VITALS REVIEWED ---------------------------   The nursing notes within the ED encounter and vital signs as below have been reviewed by myself.  BP 103/52  Pulse 93  Temp(Src) 97.8 F (36.6 C) (Oral)  Resp 18  Ht 5\' 2"  (1.575 m)  Wt 185 lb (83.915 kg)  BMI 33.84 kg/m2  SpO2 98%  LMP 03/06/2010  Breastfeeding? Unknown  Oxygen Saturation Interpretation: Normal    The patient's available past medical records and past encounters were reviewed.              Physical exam:  Constitutional: Vital signs are reviewed the patient is comfortable. The patient is alert and oriented and conversant.  Head: The head is atraumatic and normocephalic.  Eyes: No discharge is present from the eyes. The sclera are normal.  ENT: The oropharynx demonstrates a small amount of erythema bilaterally. There is no tonsillar enlargement nor is there any exudate present. No uvular deviation or edema. No tonsillary asymmetry. Floor of the mouth soft, no trismus, handling secretions. TMs bilaterally demonstrate no evidence of infection.  Neck: Normal range of motion is achieved in the neck. There is no JVD present. No meningeal signs are present Anterior cervical adenopathy is normal.  Respiratory/chest: The chest is nontender. Breath sounds are normal. There is no respiratory distress.she does have expiratory wheezes throughout.  Cardiovascular: Heart shows a regular  rate and rhythm without murmurs clicks or gallops.  Abdominal exam: The abdomen is non tender without evidence of peritoneal signs. Specific attention to the left upper quadrant with palpation of the spleen demonstrates no organomegaly or  tenderness  Skin: warm and dry, without rash  Neurologic: GCS 15  Psych: Normal Affect  -------------------------------------------------- RESULTS -------------------------------------------------    LABS:  No results found for this visit on 07/03/11.    RADIOLOGY:  Interpreted by Radiologist.             ------------------------------ ED COURSE/MEDICAL DECISION MAKING----------------------    Medications   azithromycin (ZITHROMAX Z-PAK) 250 MG tablet (not administered)   albuterol (PROVENTIL HFA) 108 (90 BASE) MCG/ACT inhaler (not administered)   ipratropium-albuterol (DUONEB) nebulizer solution 1 ampule (1 ampule Inhalation Given 07/03/11 1224)     1250- wheezing improved after breathing treatment. Patient states she does some medicine for treatment was provided. Fetal heart tones were evaluated at 134.        Medical Decision Making:       Upper respiratory infection likely viral in etiology. Not hypoxic, nothing to suggests pneumonia. Well appearing, non toxic, appropriate for outpatient management.  Plan is for symptom management and PCP follow up.        This patient's ED course included: a personal history and physicial eaxmination and re-evaluation prior to disposition    This patient has remained hemodynamically stable during their ED course.    Counseling:   The emergency provider has spoken with the patient and discussed today's results, in addition to providing specific details for the plan of care and counseling regarding the diagnosis and prognosis.  Questions are answered at this time and they are agreeable with the plan.       --------------------------------- IMPRESSION AND DISPOSITION ---------------------------------    IMPRESSION  1. Cough    2. Smoking    3. Upper respiratory infection        DISPOSITION  Disposition: Discharge to home  Patient condition is good              Merian Capron, CNP  07/04/11 1914    ATTENDING PROVIDER ATTESTATION:     I have personally performed and/or  participated in the history, exam, medical decision making, and procedures and agree with all pertinent clinical information.      I have also reviewed and agree with the past medical, family and social history unless otherwise noted.      Nuala Alpha, MD  07/04/11 [phone]

## 2011-07-29 LAB — POCT URINE QUALITATIVE DIPSTICK PROTEIN: Protein, UA: NEGATIVE

## 2011-07-29 LAB — POCT URINE QUALITATIVE DIPSTICK GLUCOSE: Glucose, UA POC: NEGATIVE

## 2011-07-30 LAB — CBC WITH AUTO DIFFERENTIAL
Absolute Eos #: 0.09 E9/L (ref 0.05–0.50)
Absolute Lymph #: 1.69 E9/L (ref 1.50–4.00)
Absolute Mono #: 0.41 E9/L (ref 0.10–0.95)
Absolute Neut #: 6.71 E9/L (ref 1.80–7.30)
Basophils Absolute: 0.03 E9/L (ref 0.00–0.20)
Basophils: 0 % (ref 0–2)
Eosinophils %: 1 % (ref 0–6)
Hematocrit: 33.3 % — ABNORMAL LOW (ref 34.0–48.0)
Hemoglobin: 11.6 g/dL (ref 11.5–15.5)
Lymphocytes: 19 % — ABNORMAL LOW (ref 20–42)
MCH: 39.3 pg — ABNORMAL HIGH (ref 26.0–35.0)
MCHC: 34.8 % — ABNORMAL HIGH (ref 32.0–34.5)
MCV: 112.7 fL — ABNORMAL HIGH (ref 80.0–99.9)
MPV: 8.5 fL (ref 7.0–12.0)
Monocytes: 5 % (ref 2–12)
Platelet Estimate: NORMAL
Platelets: 202 E9/L (ref 130–450)
RBC: 2.95 E12/L — ABNORMAL LOW (ref 3.50–5.50)
RDW: 13.8 fL (ref 11.5–15.0)
Seg Neutrophils: 75 % (ref 43–80)
WBC: 8.9 E9/L (ref 4.5–11.5)

## 2011-07-30 LAB — COMPREHENSIVE METABOLIC PANEL
ALT: 16 U/L (ref 10–49)
AST: 24 U/L (ref 0–33)
Albumin: 3.8 g/dL (ref 3.2–4.8)
Alkaline Phosphatase: 88 U/L (ref 45–129)
BUN: 5 mg/dL — ABNORMAL LOW (ref 6–20)
CO2: 22 mmol/L (ref 20–31)
Calcium: 8.5 mg/dL — ABNORMAL LOW (ref 8.6–10.5)
Chloride: 106 mmol/L (ref 99–109)
Creatinine: 0.4 mg/dL — ABNORMAL LOW (ref 0.5–1.1)
Glucose: 69 mg/dL — ABNORMAL LOW (ref 70–110)
Potassium: 3.4 mmol/L — ABNORMAL LOW (ref 3.5–5.5)
Sodium: 137 mmol/L (ref 132–146)
Total Bilirubin: 0.3 mg/dL (ref 0.3–1.2)
Total Protein: 6.3 g/dL (ref 5.7–8.2)

## 2011-07-30 LAB — HEPATITIS C ANTIBODY: Hepatitis C Ab: NONREACTIVE

## 2011-07-30 LAB — RPR: RPR: NONREACTIVE

## 2011-07-30 LAB — VITAMIN D 25 HYDROX, D2 & D3: Vitamin D2 And D3, Total: 39 ng/mL (ref 30–100)

## 2011-07-30 LAB — GFR CALCULATED: Gfr Calculated: >60 mL/min/{1.73_m2} (ref >=60)

## 2011-07-30 LAB — HIV RNA, QUANTITATIVE, PCR

## 2011-07-30 LAB — T+B LYMPHOCYTE DIFFERENTIAL

## 2011-08-19 LAB — POCT URINE QUALITATIVE DIPSTICK GLUCOSE: Glucose, UA POC: NEGATIVE

## 2011-08-19 LAB — POCT URINE QUALITATIVE DIPSTICK PROTEIN: Protein, UA: NEGATIVE

## 2011-08-19 NOTE — Progress Notes (Signed)
Pt states she will not do nst twice a week

## 2011-09-02 LAB — POCT URINE QUALITATIVE DIPSTICK GLUCOSE: Glucose, UA POC: NEGATIVE

## 2011-09-02 LAB — POCT URINE QUALITATIVE DIPSTICK PROTEIN: Protein, UA: NEGATIVE

## 2011-10-18 LAB — CBC WITH AUTO DIFFERENTIAL
Absolute Eos #: 0.27 E9/L (ref 0.05–0.50)
Absolute Lymph #: 1.61 E9/L (ref 1.50–4.00)
Absolute Mono #: 0.32 E9/L (ref 0.10–0.95)
Absolute Neut #: 2.68 E9/L (ref 1.80–7.30)
Basophils Absolute: 0.03 E9/L (ref 0.00–0.20)
Basophils: 1 % (ref 0–2)
Eosinophils %: 6 % (ref 0–6)
Hematocrit: 37.8 % (ref 34.0–48.0)
Hemoglobin: 12.2 g/dL (ref 11.5–15.5)
Lymphocytes: 33 % (ref 20–42)
MCH: 36.5 pg — ABNORMAL HIGH (ref 26.0–35.0)
MCHC: 32.2 % (ref 32.0–34.5)
MCV: 113.5 fL — ABNORMAL HIGH (ref 80.0–99.9)
MPV: 7.5 fL (ref 7.0–12.0)
Monocytes: 7 % (ref 2–12)
Platelet Estimate: INCREASED
Platelets: 592 E9/L — ABNORMAL HIGH (ref 130–450)
RBC: 3.33 E12/L — ABNORMAL LOW (ref 3.50–5.50)
RDW: 14.4 fL (ref 11.5–15.0)
Seg Neutrophils: 54 % (ref 43–80)
WBC: 4.9 E9/L (ref 4.5–11.5)

## 2011-10-18 LAB — HEPATIC FUNCTION PANEL
ALT: 21 U/L (ref 10–49)
AST: 24 U/L (ref 0–33)
Albumin: 4.3 g/dL (ref 3.2–4.8)
Alkaline Phosphatase: 169 U/L — ABNORMAL HIGH (ref 45–129)
Bilirubin, Direct: 0.1 mg/dL (ref 0.0–0.2)
Total Bilirubin: 0.3 mg/dL (ref 0.3–1.2)
Total Protein: 7.2 g/dL (ref 5.7–8.2)

## 2011-10-18 LAB — LIPID PANEL
Cholesterol: 188 mg/dL (ref 0–199)
HDL: 64 mg/dL (ref >40.0)
LDL Calculated: 93 mg/dL (ref 0–99)
Triglycerides: 154 mg/dL — ABNORMAL HIGH (ref 0–149)

## 2011-10-18 LAB — BASIC METABOLIC PANEL
BUN: 11 mg/dL (ref 6–20)
CO2: 25 mmol/L (ref 20–31)
Calcium: 8.9 mg/dL (ref 8.6–10.5)
Chloride: 106 mmol/L (ref 99–109)
Creatinine: 0.6 mg/dL (ref 0.5–1.1)
Glucose: 74 mg/dL (ref 70–110)
Potassium: 4.2 mmol/L (ref 3.5–5.5)
Sodium: 141 mmol/L (ref 132–146)

## 2011-10-18 LAB — GFR CALCULATED: Gfr Calculated: >60 mL/min/{1.73_m2} (ref >=60)

## 2011-10-18 LAB — T+B LYMPHOCYTE DIFFERENTIAL

## 2011-10-21 LAB — HIV VIRAL LOAD
HIV 1 Viral Load: <1.3 {Log}
Hiv 1 Qnt Interp: NOT DETECTED
Hiv 1 Qnt: <20 {copies}/mL

## 2012-02-18 LAB — CBC WITH AUTO DIFFERENTIAL
Absolute Eos #: 0.1 E9/L (ref 0.05–0.50)
Absolute Lymph #: 2.43 E9/L (ref 1.50–4.00)
Absolute Mono #: 0.36 E9/L (ref 0.10–0.95)
Absolute Neut #: 4.28 E9/L (ref 1.80–7.30)
Basophils Absolute: 0.03 E9/L (ref 0.00–0.20)
Basophils: 1 % (ref 0–2)
Eosinophils %: 1 % (ref 0–6)
Hematocrit: 40.7 % (ref 34.0–48.0)
Hemoglobin: 13.2 g/dL (ref 11.5–15.5)
Lymphocytes: 34 % (ref 20–42)
MCH: 29 pg (ref 26.0–35.0)
MCHC: 32.4 % (ref 32.0–34.5)
MCV: 89.5 fL (ref 80.0–99.9)
MPV: 8.9 fL (ref 7.0–12.0)
Monocytes: 5 % (ref 2–12)
Platelets: 323 E9/L (ref 130–450)
RBC: 4.55 E12/L (ref 3.50–5.50)
RDW: 13.4 fL (ref 11.5–15.0)
Seg Neutrophils: 59 % (ref 43–80)
WBC: 7.2 E9/L (ref 4.5–11.5)

## 2012-02-18 LAB — HEPATIC FUNCTION PANEL
ALT: 16 U/L (ref 10–49)
AST: 20 U/L (ref 0–33)
Albumin: 4.7 g/dL (ref 3.2–4.8)
Alkaline Phosphatase: 137 U/L — ABNORMAL HIGH (ref 45–129)
Bilirubin, Direct: 0.1 mg/dL (ref 0.0–0.2)
Total Bilirubin: 0.3 mg/dL (ref 0.3–1.2)
Total Protein: 7.2 g/dL (ref 5.7–8.2)

## 2012-02-18 LAB — BASIC METABOLIC PANEL
BUN: 10 mg/dL (ref 6–20)
CO2: 25 mmol/L (ref 20–31)
Calcium: 9.3 mg/dL (ref 8.6–10.5)
Chloride: 110 mmol/L — ABNORMAL HIGH (ref 99–109)
Creatinine: 0.5 mg/dL (ref 0.5–1.1)
Glucose: 99 mg/dL (ref 70–110)
Potassium: 3.9 mmol/L (ref 3.5–5.5)
Sodium: 139 mmol/L (ref 132–146)

## 2012-02-18 LAB — GFR CALCULATED: Gfr Calculated: >60 mL/min/{1.73_m2} (ref >=60)

## 2012-02-20 LAB — HIV VIRAL LOAD
HIV 1 Viral Load: 1.9 {Log}
Hiv 1 Qnt Interp: DETECTED — AB
Hiv 1 Qnt: 88 {copies}/mL

## 2012-02-20 LAB — T+B LYMPHOCYTE DIFFERENTIAL

## 2012-05-22 LAB — BASIC METABOLIC PANEL
BUN: 10 mg/dL (ref 6–20)
CO2: 25 mmol/L (ref 20–31)
Calcium: 9.1 mg/dL (ref 8.6–10.5)
Chloride: 107 mmol/L (ref 99–109)
Creatinine: 0.5 mg/dL (ref 0.5–1.1)
Glucose: 60 mg/dL — ABNORMAL LOW (ref 70–110)
Potassium: 4.9 mmol/L (ref 3.5–5.5)
Sodium: 138 mmol/L (ref 132–146)

## 2012-05-22 LAB — CBC WITH AUTO DIFFERENTIAL
Absolute Eos #: 0.1 E9/L (ref 0.05–0.50)
Absolute Lymph #: 1.49 E9/L — ABNORMAL LOW (ref 1.50–4.00)
Absolute Mono #: 0.61 E9/L (ref 0.10–0.95)
Absolute Neut #: 8.81 E9/L — ABNORMAL HIGH (ref 1.80–7.30)
Basophils Absolute: 0.01 E9/L (ref 0.00–0.20)
Basophils: 0 % (ref 0–2)
Eosinophils %: 1 % (ref 0–6)
Hematocrit: 40 % (ref 34.0–48.0)
Hemoglobin: 13.1 g/dL (ref 11.5–15.5)
Lymphocytes: 14 % — ABNORMAL LOW (ref 20–42)
MCH: 28.2 pg (ref 26.0–35.0)
MCHC: 32.8 % (ref 32.0–34.5)
MCV: 86 fL (ref 80.0–99.9)
MPV: 9.2 fL (ref 7.0–12.0)
Monocytes: 6 % (ref 2–12)
Platelets: 334 E9/L (ref 130–450)
RBC: 4.65 E12/L (ref 3.50–5.50)
RDW: 13.8 fL (ref 11.5–15.0)
Seg Neutrophils: 80 % (ref 43–80)
WBC: 11 E9/L (ref 4.5–11.5)

## 2012-05-22 LAB — LIPID PANEL
Cholesterol: 169 mg/dL (ref 0–199)
HDL: 67 mg/dL (ref >40.0)
LDL Calculated: 80 mg/dL (ref 0–99)
Triglycerides: 108 mg/dL (ref 0–149)

## 2012-05-22 LAB — HEPATIC FUNCTION PANEL
ALT: 7 U/L — ABNORMAL LOW (ref 10–49)
AST: 16 U/L (ref 0–33)
Albumin: 4.3 g/dL (ref 3.2–4.8)
Alkaline Phosphatase: 135 U/L — ABNORMAL HIGH (ref 45–129)
Bilirubin, Direct: 0.1 mg/dL (ref 0.0–0.2)
Total Bilirubin: 0.3 mg/dL (ref 0.3–1.2)
Total Protein: 6.8 g/dL (ref 5.7–8.2)

## 2012-05-22 LAB — GFR CALCULATED: Gfr Calculated: >60 mL/min/{1.73_m2} (ref >=60)

## 2012-05-24 LAB — HIV VIRAL LOAD
HIV 1 Viral Load: <1.3 {Log}
Hiv 1 Qnt Interp: NOT DETECTED
Hiv 1 Qnt: <20 {copies}/mL

## 2012-05-27 LAB — T+B LYMPHOCYTE DIFFERENTIAL

## 2012-08-21 LAB — HEPATIC FUNCTION PANEL
ALT: 10 U/L (ref 0–32)
AST: 15 U/L (ref 0–31)
Albumin: 4.1 g/dL (ref 3.5–5.2)
Alkaline Phosphatase: 132 U/L — ABNORMAL HIGH (ref 35–104)
Bilirubin, Direct: <0.2 mg/dL (ref 0.0–0.3)
Total Bilirubin: 0.2 mg/dL (ref 0.0–1.2)
Total Protein: 7 g/dL (ref 6.4–8.3)

## 2012-08-21 LAB — CBC WITH AUTO DIFFERENTIAL
Absolute Eos #: 0.09 E9/L (ref 0.05–0.50)
Absolute Lymph #: 1.5 E9/L (ref 1.50–4.00)
Absolute Mono #: 0.4 E9/L (ref 0.10–0.95)
Absolute Neut #: 4.64 E9/L (ref 1.80–7.30)
Basophils Absolute: 0.04 E9/L (ref 0.00–0.20)
Basophils: 1 % (ref 0–2)
Eosinophils %: 1 % (ref 0–6)
Hematocrit: 41.5 % (ref 34.0–48.0)
Hemoglobin: 13.8 g/dL (ref 11.5–15.5)
Lymphocytes: 23 % (ref 20–42)
MCH: 28.3 pg (ref 26.0–35.0)
MCHC: 33.2 % (ref 32.0–34.5)
MCV: 85.3 fL (ref 80.0–99.9)
MPV: 9.5 fL (ref 7.0–12.0)
Monocytes: 6 % (ref 2–12)
Platelets: 305 E9/L (ref 130–450)
RBC: 4.87 E12/L (ref 3.50–5.50)
RDW: 13.8 fL (ref 11.5–15.0)
Seg Neutrophils: 69 % (ref 43–80)
WBC: 6.7 E9/L (ref 4.5–11.5)

## 2012-08-21 LAB — HEPATITIS C ANTIBODY: Hepatitis C Ab: NONREACTIVE

## 2012-08-21 LAB — RPR: RPR: NONREACTIVE

## 2012-08-21 LAB — BASIC METABOLIC PANEL
BUN: 8 mg/dL (ref 6–20)
CO2: 22 mmol/L (ref 22–29)
Calcium: 9.1 mg/dL (ref 8.6–10.2)
Chloride: 101 mmol/L (ref 98–107)
Creatinine: 0.5 mg/dL (ref 0.5–1.0)
Glucose: 82 mg/dL (ref 74–109)
Potassium: 3.9 mmol/L (ref 3.5–5.0)
Sodium: 137 mmol/L (ref 132–146)

## 2012-08-21 LAB — T+B LYMPHOCYTE DIFFERENTIAL

## 2012-08-21 LAB — GFR CALCULATED: Gfr Calculated: >60 mL/min/{1.73_m2} (ref >=60)

## 2012-08-23 LAB — HIV VIRAL LOAD
HIV 1 Viral Load: 1.9 {Log}
Hiv 1 Qnt Interp: DETECTED — AB
Hiv 1 Qnt: 86 {copies}/mL

## 2013-01-20 ENCOUNTER — Encounter: Payer: Self-pay | Admitting: Family Medicine

## 2013-01-20 ENCOUNTER — Ambulatory Visit (INDEPENDENT_AMBULATORY_CARE_PROVIDER_SITE_OTHER): Payer: BC Managed Care – PPO | Admitting: Family Medicine

## 2013-01-20 VITALS — BP 130/85 | HR 68 | Temp 98.5°F | Ht 65.5 in | Wt 309.0 lb

## 2013-01-20 DIAGNOSIS — G8929 Other chronic pain: Secondary | ICD-10-CM

## 2013-01-20 DIAGNOSIS — N6001 Solitary cyst of right breast: Secondary | ICD-10-CM | POA: Insufficient documentation

## 2013-01-20 DIAGNOSIS — Z8719 Personal history of other diseases of the digestive system: Secondary | ICD-10-CM

## 2013-01-20 DIAGNOSIS — E010 Iodine-deficiency related diffuse (endemic) goiter: Secondary | ICD-10-CM

## 2013-01-20 DIAGNOSIS — R109 Unspecified abdominal pain: Secondary | ICD-10-CM

## 2013-01-20 DIAGNOSIS — R197 Diarrhea, unspecified: Secondary | ICD-10-CM | POA: Insufficient documentation

## 2013-01-20 DIAGNOSIS — R1310 Dysphagia, unspecified: Secondary | ICD-10-CM

## 2013-01-20 DIAGNOSIS — E049 Nontoxic goiter, unspecified: Secondary | ICD-10-CM

## 2013-01-20 DIAGNOSIS — R350 Frequency of micturition: Secondary | ICD-10-CM

## 2013-01-20 HISTORY — DX: Personal history of other diseases of the digestive system: Z87.19

## 2013-01-20 NOTE — Addendum Note (Signed)
Addended by: Wyline Beady on: 01/20/2013 04:58 PM   Modules accepted: Orders

## 2013-01-20 NOTE — Progress Notes (Signed)
CC: Valerie Daniel is a 47 y.o. female is here for RLQ pain   Subjective: HPI:  Patient complains of worsening diffuse abdominal pain described as mild at onset 2 months ago slowly worsening now severe on a daily basis. Symptoms come and go throughout the day are worsened by eating improved by fasting, has slightly been improved with defecation but no longer related to bowel movements. Pain is described as a cramping it is nonradiating it can occur in any quadrant but typically is in only one quadrant at time. It improves only with time. It is often associated with nausea but no vomiting. Since these symptoms began she has been having up to 15 self reported bowel movements a day. She has to plan her days based on what public bathrooms might be available. When she passes stool is somewhat loose mixed with small pellets. She denies tar-colored stool nor blood in her stool.    She also complains of years of unintentional weight gain, constipation, lower extremity edema and fatigue. She reports all females in her family have thyroid disease. She has been checked for thyroid abnormalities most recently one month ago per her report which was normal. She endorses a sensation of swelling in her lower neck anteriorly with trouble swallowing that has been worsening along with the swelling over the past 2 months. She reports brittle nails and dry skin no hair loss. She tells me complete metabolic panel was normal specifically glucose has always been within normal limits, she reports complete blood count last month was normal as well.  Patient complains of urinary frequency every evening. Waking one to 2 hours a night to urinate she feels complete evacuation at the time of urination without difficulty voiding. Urination frequency during the day is much less frequent. She denies dysuria, flank pain, pelvic pain, nor genitourinary complaints   Review of Systems - General ROS: negative for - chills, fever, night  sweats, weight loss Ophthalmic ROS: negative for - decreased vision Psychological ROS: negative for - anxiety or depression ENT ROS: negative for - hearing change, nasal congestion, tinnitus or allergies Hematological and Lymphatic ROS: negative for - bleeding problems, bruising or swollen lymph nodes Breast ROS: negative Respiratory ROS: no cough, shortness of breath, or wheezing Cardiovascular ROS: no chest pain or dyspnea on exertion Genito-Urinary ROS: negative for - genital discharge, genital ulcers, incontinence or abnormal bleeding from genitals Musculoskeletal ROS: negative for - joint pain or muscle pain Neurological ROS: negative for - headaches or memory loss Dermatological ROS: negative for lumps, mole changes, rash and skin lesion changes  Past Medical History  Diagnosis Date  . History of gallstones 01/20/2013  . History of breast cancer 01/20/2013     Family History  Problem Relation Age of Onset  . Thyroid disease Mother   . Thyroid disease Sister      History  Substance Use Topics  . Smoking status: Never Smoker   . Smokeless tobacco: Not on file  . Alcohol Use: 0.5 oz/week    1 drink(s) per week     Objective: Filed Vitals:   01/20/13 0942  BP: 130/85  Pulse: 68  Temp: 98.5 F (36.9 C)    General: Alert and Oriented, No Acute Distress HEENT: Pupils equal, round, reactive to light. Conjunctivae clear.  External ears unremarkable, canals clear with intact TMs with appropriate landmarks.  Middle ear appears open without effusion. Pink inferior turbinates.  Moist mucous membranes, pharynx without inflammation nor lesions.  Neck supple without palpable lymphadenopathy  thyroid appears mildly enlarged with mild homogenous nodularity. Lungs: Clear to auscultation bilaterally, no wheezing/ronchi/rales.  Comfortable work of breathing. Good air movement. Cardiac: Regular rate and rhythm. Normal S1/S2.  No murmurs, rubs, nor gallops.   Abdomen:  obese soft nontender  mild periumbilical pain with deep palpation no palpable masses no rebound no guarding  Extremities:  2+ nonpitting need to forefoot edema.  Strong peripheral pulses.  Mental Status: No depression, anxiety, nor agitation. Skin: Warm and dry.  Assessment & Plan: Savonna was seen today for rlq pain.  Diagnoses and associated orders for this visit:  Chronic abdominal pain - CT Abdomen Pelvis W Contrast; Future  Diarrhea - CT Abdomen Pelvis W Contrast; Future - Clostridium difficile EIA  Urinary frequency  Dysphagia, unspecified(787.20) - US Soft Tissue Head/Neck; Future  Thyromegaly - US Soft Tissue Head/Neck; Future    Chronic abdominal pain: I am suspicious for fecal impaction or some other chronic worsening obstruction contributing to her discomfort, I would like her to have a non-emergent CT of the abdomen and pelvis given the duration of her symptoms and her description of severity. Urinary frequency: Urinalysis was obtained without abnormality, will hold off on more extensive blood work pending labs from outside offices that have already been requested by the patient. Dysphagia: Given mild thyromegaly and nodularity will order soft tissue of the neck and thyroid to for better characterization of nodularity and overall size.  45 minutes spent face-to-face during visit today of which at least 50% was counseling or coordinating care regarding chronic abdominal pain, diarrhea, urinary frequency, dysphagia, thyromegaly.   Return in about 2 weeks (around 02/03/2013) for Abdominal Pain.

## 2013-01-21 ENCOUNTER — Telehealth: Payer: Self-pay | Admitting: *Deleted

## 2013-01-21 NOTE — Telephone Encounter (Signed)
Called pt to let her know the status of PA. As of right now PA is pending for her abd CT. [employer] [place] requires further clinical review and will contact us in 1-2 business days. In the meantime I will fax our notes to [phone]. Pt wanted to know if anything else could be done or if we needed any of her previous providers notes. Advised pt that we will send over Dr. Genelle Bal findings and if they still need further info then we can look into faxing notes to previous provider. Pt states last pm her stomach hurt and she felt like she couldn't eat anything and she felt a " ball" on her rt side. Pt wanted to know if she could take something to soften her stools. Advised pt that if felt she needed to take something to help her bowels move or soften then prob the safest thing would be miralax. Pt voiced understanding. Advised pt that if new  sxs started such as fever, vomiting, or extreme pain ect then to go to ER. Pt voiced understanding. Case number is [id number]

## 2013-01-23 LAB — CLOSTRIDIUM DIFFICILE EIA: CDIFTX: NEGATIVE

## 2013-01-23 LAB — URINE CULTURE: Colony Count: 95000

## 2013-01-25 ENCOUNTER — Telehealth: Payer: Self-pay | Admitting: Family Medicine

## 2013-01-25 DIAGNOSIS — R350 Frequency of micturition: Secondary | ICD-10-CM

## 2013-01-25 NOTE — Telephone Encounter (Signed)
Valerie Daniel will you please let mrs. Valerie Daniel know that her stool sample was negative for c.diff infection.  The urine culture did show some bacteria but it looks like there may have been some contamination with skin bacteria which didn't allow [place] to isolate just one specific bacteria.  If she's still having urinary frequency I'd encourage her to provide a second sample, I'll print off another culture order.

## 2013-01-25 NOTE — Telephone Encounter (Signed)
Pt notified of results. Pt feels she does not have a UTI so declined providing another urine sample

## 2013-01-26 ENCOUNTER — Encounter: Payer: Self-pay | Admitting: Family Medicine

## 2013-01-26 ENCOUNTER — Telehealth: Payer: Self-pay | Admitting: *Deleted

## 2013-01-26 DIAGNOSIS — S82891A Other fracture of right lower leg, initial encounter for closed fracture: Secondary | ICD-10-CM | POA: Insufficient documentation

## 2013-01-26 MED ORDER — CETIRIZINE HCL 10 MG PO TABS: 10.0000 mg | ORAL_TABLET | Freq: Every day | ORAL | Status: AC

## 2013-01-26 MED ORDER — ESOMEPRAZOLE MAGNESIUM 40 MG PO CPDR: 40.0000 mg | DELAYED_RELEASE_CAPSULE | Freq: Every day | ORAL | Status: DC

## 2013-01-26 MED ORDER — HYOSCYAMINE SULFATE 0.125 MG PO TABS: 0.1250 mg | ORAL_TABLET | ORAL | Status: DC | PRN

## 2013-01-26 MED ORDER — MOMETASONE FUROATE 50 MCG/ACT NA SUSP: 2.0000 | Freq: Every day | NASAL | Status: AC

## 2013-01-27 ENCOUNTER — Ambulatory Visit (INDEPENDENT_AMBULATORY_CARE_PROVIDER_SITE_OTHER): Payer: BC Managed Care – PPO

## 2013-01-27 ENCOUNTER — Telehealth: Payer: Self-pay | Admitting: Family Medicine

## 2013-01-27 DIAGNOSIS — E049 Nontoxic goiter, unspecified: Secondary | ICD-10-CM

## 2013-01-27 DIAGNOSIS — R197 Diarrhea, unspecified: Secondary | ICD-10-CM

## 2013-01-27 DIAGNOSIS — R109 Unspecified abdominal pain: Secondary | ICD-10-CM

## 2013-01-27 DIAGNOSIS — G8929 Other chronic pain: Secondary | ICD-10-CM

## 2013-01-27 DIAGNOSIS — R110 Nausea: Secondary | ICD-10-CM

## 2013-01-27 DIAGNOSIS — E010 Iodine-deficiency related diffuse (endemic) goiter: Secondary | ICD-10-CM

## 2013-01-27 DIAGNOSIS — R1310 Dysphagia, unspecified: Secondary | ICD-10-CM

## 2013-01-27 MED ORDER — IOHEXOL 300 MG/ML  SOLN
100.0000 mL | Freq: Once | INTRAMUSCULAR | Status: AC | PRN
  Administered 2013-01-27: 95 mL via INTRAVENOUS

## 2013-01-27 MED ORDER — DIPHENOXYLATE-ATROPINE 2.5-0.025 MG PO TABS: 1.0000 | ORAL_TABLET | Freq: Four times a day (QID) | ORAL | Status: DC | PRN

## 2013-01-27 NOTE — Progress Notes (Unsigned)
Error   This encounter was created in error - please disregard. 

## 2013-01-27 NOTE — Telephone Encounter (Signed)
Sue Lush,  Will you please let mrs. Deangelo know that her CT scan did not show any abnormalities, I'm going to place a GI referral to see if they can help find the source of her diarhea.  Lomotil Rx has been printed (fax/pickup) that she can use as needed for diarrhea.    Thyroid ultrasound showed four small nodules scattered in the thyroid which is why she can feel her thyroid.  The radiologist classified these as benign and not compromising any structures in the neck.  These are very common, but I'll encourage a repeat u/s in 6-12 months.

## 2013-01-28 NOTE — Telephone Encounter (Signed)
Pt.notified

## 2013-02-04 ENCOUNTER — Encounter: Payer: Self-pay | Admitting: Family Medicine

## 2013-02-09 ENCOUNTER — Telehealth: Payer: Self-pay | Admitting: *Deleted

## 2013-02-09 NOTE — Telephone Encounter (Signed)
Pt left a message wanting to know that results of u/s done at [facility]. i did have them fax over a report to have on file and Dr. Linford Arnold reviewed it. I left a message on vm that u/s i sbasically benign but she needs to contact [facility] since she was referred to them and they ordered the u/s to see what the next step is

## 2013-02-15 ENCOUNTER — Encounter: Payer: Self-pay | Admitting: Family Medicine

## 2013-02-15 DIAGNOSIS — Z862 Personal history of diseases of the blood and blood-forming organs and certain disorders involving the immune mechanism: Secondary | ICD-10-CM | POA: Insufficient documentation

## 2013-02-16 LAB — CBC WITH AUTO DIFFERENTIAL
Basophils %: 0 % (ref 0–2)
Basophils Absolute: 0.02 E9/L (ref 0.00–0.20)
Eosinophils %: 1 % (ref 0–6)
Eosinophils Absolute: 0.07 E9/L (ref 0.05–0.50)
Hematocrit: 42.1 % (ref 34.0–48.0)
Hemoglobin: 13.9 g/dL (ref 11.5–15.5)
Lymphocytes %: 34 % (ref 20–42)
Lymphocytes Absolute: 2.04 E9/L (ref 1.50–4.00)
MCH: 26.6 pg (ref 26.0–35.0)
MCHC: 33 % (ref 32.0–34.5)
MCV: 80.8 fL (ref 80.0–99.9)
MPV: 9.1 fL (ref 7.0–12.0)
Monocytes %: 6 % (ref 2–12)
Monocytes Absolute: 0.38 E9/L (ref 0.10–0.95)
Neutrophils %: 59 % (ref 43–80)
Neutrophils Absolute: 3.55 E9/L (ref 1.80–7.30)
Platelets: 269 E9/L (ref 130–450)
RBC: 5.21 E12/L (ref 3.50–5.50)
RDW: 12.7 fL (ref 11.5–15.0)
WBC: 6.1 E9/L (ref 4.5–11.5)

## 2013-02-16 LAB — BASIC METABOLIC PANEL
BUN: 9 mg/dL (ref 6–20)
CO2: 17 mmol/L — ABNORMAL LOW (ref 22–29)
Calcium: 9.8 mg/dL (ref 8.6–10.2)
Chloride: 105 mmol/L (ref 98–107)
Creatinine: 0.4 mg/dL — ABNORMAL LOW (ref 0.5–1.0)
GFR African American: >60
GFR Non-African American: >60 mL/min/{1.73_m2} (ref >=60)
Glucose: 111 mg/dL — ABNORMAL HIGH (ref 74–109)
Potassium: 3.6 mmol/L (ref 3.5–5.0)
Sodium: 141 mmol/L (ref 132–146)

## 2013-02-16 LAB — HEPATIC FUNCTION PANEL
ALT: 16 U/L (ref 0–32)
AST: 23 U/L (ref 0–31)
Albumin: 4 g/dL (ref 3.5–5.2)
Alkaline Phosphatase: 163 U/L — ABNORMAL HIGH (ref 35–104)
Bilirubin, Direct: <0.2 mg/dL (ref 0.0–0.3)
Bilirubin, Indirect: 0 mg/dL (ref 0.0–1.0)
Total Bilirubin: 0.2 mg/dL (ref 0.0–1.2)
Total Protein: 7.1 g/dL (ref 6.4–8.3)

## 2013-02-17 LAB — T-CELL IMMUNODEFICIENCY PROFILE
% CD 3 Pos. Lymph.: 66 % (ref 62–87)
% CD8: 46 % (ref 14–46)
% NK (CD56/16): 7 % (ref 4–26)
Absolute CD 3: 1396 cells/uL (ref 570–2400)
Absolute CD 4 Helper: 401 cells/uL — ABNORMAL LOW (ref 430–1800)
Absolute CD 8 (Supp): 974 cells/uL (ref 210–1200)
CD19 %: 26 % — ABNORMAL HIGH (ref 6–23)
CD19 Abs: 559 cells/uL (ref 91–610)
CD4 % Helper T Cell: 19 % — ABNORMAL LOW (ref 32–64)
CD4/CD8 Ratio: 0.41 ratio — ABNORMAL LOW (ref 0.80–3.90)
NK CD16 and CD56 Abs: 144 cells/uL (ref 78–470)

## 2013-02-18 LAB — HIV VIRAL LOAD
HIV RNA PCR Interpretation: NOT DETECTED
HIV-1 RNA by PCR, Qn: <1.3 {Log}
HIV-1 RNA by PCR, Qn: <20 {copies}/mL

## 2013-03-01 ENCOUNTER — Telehealth: Payer: Self-pay

## 2013-03-01 MED ORDER — AMOXICILLIN-POT CLAVULANATE 500-125 MG PO TABS: ORAL_TABLET | ORAL | Status: DC

## 2013-03-01 NOTE — Telephone Encounter (Signed)
Patient called stated that she needs a rx for Augmentin she is going out of town this weeks and need something to clear up her sinus infection.

## 2013-03-01 NOTE — Telephone Encounter (Signed)
Valerie Daniel, Will you please let Mrs. Bellanger know that I've sent an rx to her [facility], our clinic typically requires office visits for consideration of antiboitics but I'll make an exception.  Just for future reference.

## 2013-03-02 ENCOUNTER — Encounter: Payer: Self-pay | Admitting: Family Medicine

## 2013-03-02 DIAGNOSIS — K589 Irritable bowel syndrome without diarrhea: Secondary | ICD-10-CM | POA: Insufficient documentation

## 2013-03-02 NOTE — Telephone Encounter (Signed)
Left detailed message on vm.

## 2013-03-08 ENCOUNTER — Telehealth: Payer: Self-pay | Admitting: *Deleted

## 2013-03-08 ENCOUNTER — Encounter: Payer: Self-pay | Admitting: Family Medicine

## 2013-03-08 ENCOUNTER — Ambulatory Visit (INDEPENDENT_AMBULATORY_CARE_PROVIDER_SITE_OTHER): Payer: BC Managed Care – PPO | Admitting: Family Medicine

## 2013-03-08 VITALS — BP 134/75 | HR 74 | Wt 309.0 lb

## 2013-03-08 DIAGNOSIS — R011 Cardiac murmur, unspecified: Secondary | ICD-10-CM

## 2013-03-08 DIAGNOSIS — E041 Nontoxic single thyroid nodule: Secondary | ICD-10-CM

## 2013-03-08 LAB — TSH: TSH: 1.081 u[IU]/mL (ref 0.350–4.500)

## 2013-03-08 LAB — T4, FREE: Free T4: 1.47 ng/dL (ref 0.80–1.80)

## 2013-03-08 LAB — T3, FREE: T3, Free: 3.1 pg/mL (ref 2.3–4.2)

## 2013-03-08 NOTE — Telephone Encounter (Signed)
No prior auth needed through [facility] [place].

## 2013-03-08 NOTE — Progress Notes (Signed)
CC: Valerie Daniel is a 47 y.o. female is here for Heart Murmur   Subjective: HPI:  Patient complains of a murmur that was found by her GI physician last month. She tells me everybody in her family has a murmur in that her brother died in his mid 61s while sleeping the family believes that this is due to his murmur. Father has a history of what sounds like aortic stenosis requiring porcine valve.  No known history of coronary artery disease in the family.  Patient complains of intermittent shortness of breath with exertion over the past 2-3 months. She denies orthopnea nor PND but does have peripheral edema. Denies exertional chest pain.    Patient is concerned about her thyroid nodules. Ultrasound was done earlier this year revealed nodules that were likely benign and did not meet size criteria for biopsy. Patient is extremely concerned that she may have a nodule that is producing thyroid hormone inappropriately. She's unsure last time her TSH or free thyroid hormones were checked. She complains of unintentional weight gain and intermittent diarrhea but denies tremor, anxiety, constipation. She is specifically requesting a thyroid uptake scan.  Review Of Systems Outlined In HPI  Past Medical History  Diagnosis Date  . History of gallstones 01/20/2013  . History of breast cancer 01/20/2013     Family History  Problem Relation Age of Onset  . Thyroid disease Mother   . Thyroid disease Sister      History  Substance Use Topics  . Smoking status: Never Smoker   . Smokeless tobacco: Not on file  . Alcohol Use: 0.5 oz/week    1 drink(s) per week     Objective: Filed Vitals:   03/08/13 0839  BP: 134/75  Pulse: 74    General: Alert and Oriented, No Acute Distress HEENT: Pupils equal, round, reactive to light. Conjunctivae clear.  Moist mucous membranes pharynx unremarkable, thyroid gland is not palpable Lungs: Clear to auscultation bilaterally, no wheezing/ronchi/rales.  Comfortable  work of breathing. Good air movement. Cardiac: Regular rate and rhythm. Normal S1/S2.  Grade 1/6 holosystolic murmur at the left second intercostal space nonradiating. No rubs, nor gallops.  No carotid bruits Extremities: Trace bilateral ankle edema which is nonpitting.  Strong peripheral pulses.  Mental Status: No depression, anxiety, nor agitation. Skin: Warm and dry.  Assessment & Plan: Clairissa was seen today for heart murmur.  Diagnoses and associated orders for this visit:  Thyroid nodule - Cancel: NM THYROID UPTAKE SINGLE; Future - TSH - T3, free - T4, free - NM Thyroid Scan/Uptake 24 Hr; Future  Heart murmur - 2D Echocardiogram without contrast; Future    Heart Murmur: Obtaining echocardiogram to further evaluate murmur Thyroid nodule: Patient is specifically requesting thyroid uptake scan, I counseled her that we can hold off on doing this at this time however she is persistent about having it done to characterize her nodules is either hot or cold, she is due for thyroid studies above regardless of thyroid scan  Return in about 4 weeks (around 04/05/2013).

## 2013-03-10 ENCOUNTER — Ambulatory Visit (HOSPITAL_BASED_OUTPATIENT_CLINIC_OR_DEPARTMENT_OTHER): Payer: BC Managed Care – PPO

## 2013-03-18 ENCOUNTER — Encounter (HOSPITAL_COMMUNITY)
Admission: RE | Admit: 2013-03-18 | Discharge: 2013-03-18 | Disposition: A | Discharge status: Home / Self Care | Payer: BC Managed Care – PPO | Source: Ambulatory Visit | Attending: Family Medicine | Admitting: Family Medicine

## 2013-03-18 ENCOUNTER — Other Ambulatory Visit: Payer: Self-pay | Admitting: *Deleted

## 2013-03-18 DIAGNOSIS — R011 Cardiac murmur, unspecified: Secondary | ICD-10-CM

## 2013-03-18 DIAGNOSIS — E041 Nontoxic single thyroid nodule: Secondary | ICD-10-CM

## 2013-03-19 ENCOUNTER — Ambulatory Visit (HOSPITAL_COMMUNITY)
Admission: RE | Admit: 2013-03-19 | Discharge: 2013-03-19 | Disposition: A | Discharge status: Home / Self Care | Payer: BC Managed Care – PPO | Source: Ambulatory Visit | Attending: Family Medicine | Admitting: Family Medicine

## 2013-03-19 ENCOUNTER — Encounter (HOSPITAL_COMMUNITY)
Admission: RE | Admit: 2013-03-19 | Discharge: 2013-03-19 | Disposition: A | Discharge status: Home / Self Care | Payer: BC Managed Care – PPO | Source: Ambulatory Visit | Attending: Family Medicine | Admitting: Family Medicine

## 2013-03-19 ENCOUNTER — Other Ambulatory Visit (HOSPITAL_COMMUNITY): Payer: Self-pay | Admitting: Family Medicine

## 2013-03-19 DIAGNOSIS — E041 Nontoxic single thyroid nodule: Secondary | ICD-10-CM | POA: Insufficient documentation

## 2013-03-19 DIAGNOSIS — R011 Cardiac murmur, unspecified: Secondary | ICD-10-CM

## 2013-03-19 DIAGNOSIS — I517 Cardiomegaly: Secondary | ICD-10-CM

## 2013-03-19 MED ORDER — SODIUM PERTECHNETATE TC 99M INJECTION
11.0000 | Freq: Once | INTRAVENOUS | Status: AC | PRN
  Administered 2013-03-19: 11 via INTRAVENOUS

## 2013-03-19 MED ORDER — SODIUM IODIDE I 131 CAPSULE
16.0000 | Freq: Once | INTRAVENOUS | Status: AC | PRN
  Administered 2013-03-18: 16 via ORAL

## 2013-03-22 ENCOUNTER — Encounter: Payer: Self-pay | Admitting: Family Medicine

## 2013-03-22 DIAGNOSIS — I517 Cardiomegaly: Secondary | ICD-10-CM | POA: Insufficient documentation

## 2013-04-26 ENCOUNTER — Other Ambulatory Visit: Payer: Self-pay | Admitting: Family Medicine

## 2013-06-25 ENCOUNTER — Other Ambulatory Visit: Payer: Self-pay | Admitting: Family Medicine

## 2013-09-14 LAB — CBC WITH AUTO DIFFERENTIAL
Basophils %: 0 % (ref 0–2)
Basophils Absolute: 0.02 E9/L (ref 0.00–0.20)
Eosinophils %: 1 % (ref 0–6)
Eosinophils Absolute: 0.09 E9/L (ref 0.05–0.50)
Hematocrit: 40.5 % (ref 34.0–48.0)
Hemoglobin: 13.5 g/dL (ref 11.5–15.5)
Lymphocytes %: 33 % (ref 20–42)
Lymphocytes Absolute: 2.5 E9/L (ref 1.50–4.00)
MCH: 27.2 pg (ref 26.0–35.0)
MCHC: 33.3 % (ref 32.0–34.5)
MCV: 81.5 fL (ref 80.0–99.9)
MPV: 8.9 fL (ref 7.0–12.0)
Monocytes %: 7 % (ref 2–12)
Monocytes Absolute: 0.49 E9/L (ref 0.10–0.95)
Neutrophils %: 59 % (ref 43–80)
Neutrophils Absolute: 4.4 E9/L (ref 1.80–7.30)
Platelets: 269 E9/L (ref 130–450)
RBC: 4.96 E12/L (ref 3.50–5.50)
RDW: 12.7 fL (ref 11.5–15.0)
WBC: 7.5 E9/L (ref 4.5–11.5)

## 2013-09-14 LAB — BASIC METABOLIC PANEL
Anion Gap: 14 mmol/L (ref 7–16)
BUN: 8 mg/dL (ref 6–20)
CO2: 22 mmol/L (ref 22–29)
Calcium: 8.9 mg/dL (ref 8.6–10.2)
Chloride: 105 mmol/L (ref 98–107)
Creatinine: 0.4 mg/dL — ABNORMAL LOW (ref 0.5–1.0)
GFR African American: >60
GFR Non-African American: >60 mL/min/{1.73_m2} (ref >=60)
Glucose: 99 mg/dL (ref 74–109)
Potassium: 3.7 mmol/L (ref 3.5–5.0)
Sodium: 141 mmol/L (ref 132–146)

## 2013-09-14 LAB — HEPATIC FUNCTION PANEL
ALT: 16 U/L (ref 0–32)
AST: 19 U/L (ref 0–31)
Albumin: 3.8 g/dL (ref 3.5–5.2)
Alkaline Phosphatase: 172 U/L — ABNORMAL HIGH (ref 35–104)
Bilirubin, Direct: <0.2 mg/dL (ref 0.0–0.3)
Bilirubin, Indirect: 0 mg/dL (ref 0.0–1.0)
Total Bilirubin: 0.2 mg/dL (ref 0.0–1.2)
Total Protein: 6.3 g/dL — ABNORMAL LOW (ref 6.4–8.3)

## 2013-09-16 LAB — HIV VIRAL LOAD
HIV RNA PCR Interpretation: NOT DETECTED
HIV-1 RNA by PCR, Qn: <1.3 {Log}
HIV-1 RNA by PCR, Qn: <20 {copies}/mL

## 2013-09-18 LAB — T-CELL IMMUNODEFICIENCY PROFILE
% CD 3 Pos. Lymph.: 61 % — ABNORMAL LOW (ref 62–87)
% CD8: 40 % (ref 14–46)
% NK (CD56/16): 6 % (ref 4–26)
Absolute CD 3: 1336 cells/uL (ref 570–2400)
Absolute CD 4 Helper: 441 cells/uL (ref 430–1800)
Absolute CD 8 (Supp): 872 cells/uL (ref 210–1200)
CD19 %: 32 % — ABNORMAL HIGH (ref 6–23)
CD19 Abs: 702 cells/uL — ABNORMAL HIGH (ref 91–610)
CD4 % Helper T Cell: 20 % — ABNORMAL LOW (ref 32–64)
CD4/CD8 Ratio: 0.5 ratio — ABNORMAL LOW (ref 0.80–3.90)
NK CD16 and CD56 Abs: 141 cells/uL (ref 78–470)

## 2014-04-13 ENCOUNTER — Ambulatory Visit: Payer: BC Managed Care – PPO | Admitting: Family Medicine

## 2014-04-15 ENCOUNTER — Ambulatory Visit: Payer: BC Managed Care – PPO | Admitting: Family Medicine

## 2014-04-19 ENCOUNTER — Ambulatory Visit (INDEPENDENT_AMBULATORY_CARE_PROVIDER_SITE_OTHER): Payer: BC Managed Care – PPO

## 2014-04-19 ENCOUNTER — Encounter: Payer: Self-pay | Admitting: Family Medicine

## 2014-04-19 ENCOUNTER — Ambulatory Visit (INDEPENDENT_AMBULATORY_CARE_PROVIDER_SITE_OTHER): Payer: BC Managed Care – PPO | Admitting: Family Medicine

## 2014-04-19 VITALS — BP 126/70 | HR 76 | Wt 326.0 lb

## 2014-04-19 DIAGNOSIS — M25562 Pain in left knee: Secondary | ICD-10-CM

## 2014-04-19 DIAGNOSIS — M25462 Effusion, left knee: Secondary | ICD-10-CM

## 2014-04-19 MED ORDER — TRAMADOL HCL 50 MG PO TABS: 50.0000 mg | ORAL_TABLET | Freq: Three times a day (TID) | ORAL | Status: DC | PRN

## 2014-04-19 NOTE — Progress Notes (Signed)
CC: Valerie Daniel is a 48 y.o. female is here for left leg pain   Subjective: HPI:  Complaint of left knee pain that has been present for the past 3-4 days after she fell and tripped over some logs, she denies immediate pain but had onset of pain a few hours after the fall. It is described as pain, sharp, irregular in the back of the knee now an anterior knee. Worse with standing, apparently, flexing the knee. Interventions have included acetaminophen without much benefit. No other intervention as of yet. She reports moderate swelling the day of the accident with slow resolution 100% gone.  Pain improves when not bearing weight. She felt like the knee might have given way yesterday while standing but there is been no locking or catching. She denies any overlying bruising. Denies fevers, chills, nausea, nor joint pain elsewhere. Pain is currently a 15 on the 0-10 pain scale  She specifically requesting a note documenting her accident so she can submit this to [employer].     Review Of Systems Outlined In HPI  Past Medical History  Diagnosis Date  . History of gallstones 01/20/2013  . History of breast cancer 01/20/2013    Past Surgical History  Procedure Laterality Date  . Cholecystectomy     Family History  Problem Relation Age of Onset  . Thyroid disease Mother   . Thyroid disease Sister     History   Social History  . Marital Status: Single    Spouse Name: N/A    Number of Children: N/A  . Years of Education: N/A   Occupational History  . Not on file.   Social History Main Topics  . Smoking status: Never Smoker   . Smokeless tobacco: Not on file  . Alcohol Use: 0.5 oz/week    1 drink(s) per week  . Drug Use: Yes  . Sexual Activity: Not on file   Other Topics Concern  . Not on file   Social History Narrative  . No narrative on file     Objective: BP 126/70  Pulse 76  Wt 326 lb (147.873 kg)  LMP 04/04/2014  General: Alert and Oriented, No Acute Distress HEENT:  Pupils equal, round, reactive to light. Conjunctivae clear.  Moist mucous membranes pharynx unremarkable Lungs: Clear comfortable work of breathing Cardiac: Regular rate and rhythm.  Abdomen: Normal bowel sounds, soft and non tender without palpable masses. Extremities: No peripheral edema.  Strong peripheral pulses.  Left knee exam shows full-strength and range of motion. There is no swelling, redness, nor warmth overlying the knee.  No patellar crepitus. No patellar apprehension. No pain with palpation of the inferior patellar pole, pain is reproduced with palpation of the superior patella at the quadriceps insertion.  No pain or laxity with valgus nor varus stress. Patient refuses examination via anterior drawer and McMurray test. No medial or lateral joint line tenderness to palpation. Mental Status: No depression, anxiety, nor agitation. Skin: Warm and dry.  Assessment & Plan: [name] was seen today for left leg pain.  Diagnoses and associated orders for this visit:  Left knee pain - DG Knee Complete 4 Views Left; Future - traMADol (ULTRAM) 50 MG tablet; Take 1 tablet (50 mg total) by mouth every 8 (eight) hours as needed.    Left knee pain: Given severity of pain will obtain an x-ray to rule out quadricep tendon avulsion versus stress fracture of the tibia. I've given her tramadol for pain, encourage her to use up to 800  mg of ibuprofen 3 times a day for additional pain control pending the results of her x-ray.   Return if symptoms worsen or fail to improve.

## 2014-06-22 ENCOUNTER — Other Ambulatory Visit: Payer: Self-pay | Admitting: Family Medicine

## 2014-06-22 DIAGNOSIS — Z9289 Personal history of other medical treatment: Secondary | ICD-10-CM

## 2014-06-30 ENCOUNTER — Ambulatory Visit (INDEPENDENT_AMBULATORY_CARE_PROVIDER_SITE_OTHER): Payer: BC Managed Care – PPO

## 2014-06-30 DIAGNOSIS — Z9289 Personal history of other medical treatment: Secondary | ICD-10-CM

## 2014-06-30 DIAGNOSIS — R928 Other abnormal and inconclusive findings on diagnostic imaging of breast: Secondary | ICD-10-CM

## 2014-07-01 ENCOUNTER — Encounter: Payer: Self-pay | Admitting: Family Medicine

## 2014-07-01 DIAGNOSIS — N6310 Unspecified lump in the right breast, unspecified quadrant: Secondary | ICD-10-CM | POA: Insufficient documentation

## 2014-07-04 ENCOUNTER — Other Ambulatory Visit: Payer: Self-pay | Admitting: Family Medicine

## 2014-07-04 DIAGNOSIS — R928 Other abnormal and inconclusive findings on diagnostic imaging of breast: Secondary | ICD-10-CM

## 2014-07-13 ENCOUNTER — Ambulatory Visit
Admission: RE | Admit: 2014-07-13 | Discharge: 2014-07-13 | Disposition: A | Discharge status: Home / Self Care | Payer: BC Managed Care – PPO | Source: Ambulatory Visit | Attending: Family Medicine | Admitting: Family Medicine

## 2014-07-13 DIAGNOSIS — R928 Other abnormal and inconclusive findings on diagnostic imaging of breast: Secondary | ICD-10-CM

## 2014-07-28 ENCOUNTER — Ambulatory Visit (INDEPENDENT_AMBULATORY_CARE_PROVIDER_SITE_OTHER): Payer: BLUE CROSS/BLUE SHIELD

## 2014-07-28 ENCOUNTER — Encounter: Payer: Self-pay | Admitting: Family Medicine

## 2014-07-28 ENCOUNTER — Ambulatory Visit (INDEPENDENT_AMBULATORY_CARE_PROVIDER_SITE_OTHER): Payer: BLUE CROSS/BLUE SHIELD | Admitting: Family Medicine

## 2014-07-28 VITALS — BP 138/82 | HR 77 | Ht 65.5 in | Wt 323.0 lb

## 2014-07-28 DIAGNOSIS — M79672 Pain in left foot: Secondary | ICD-10-CM

## 2014-07-28 DIAGNOSIS — M25521 Pain in right elbow: Secondary | ICD-10-CM

## 2014-07-28 DIAGNOSIS — M25562 Pain in left knee: Secondary | ICD-10-CM | POA: Diagnosis not present

## 2014-07-28 MED ORDER — TRAMADOL HCL 50 MG PO TABS: 50.0000 mg | ORAL_TABLET | Freq: Three times a day (TID) | ORAL | Status: DC | PRN

## 2014-07-28 NOTE — Progress Notes (Signed)
CC: Valerie Daniel is a 49 y.o. female is here for Elbow Pain and Toe Pain   Subjective: HPI:  Complains of right elbow pain present since Monday following a fall. It is described only as pain, moderate in severity radiates down the dorsal aspect of the forearm. Predominately localized on the lateral aspect of the elbow. Has not gotten better or worse since onset. No interventions as of yet. It's worse with any quick twisting motions or rotational motions of the forearm. Denies any overlying swelling redness or warmth.  Left foot pain is localized in the lateral aspect in the plantar aspect of the foot. It occurred immediately after a fall. It hurts to bear weight or with any twisting motions of the ankle. No interventions as of yet. Accompanied by mild swelling but no redness or any other skin changes overlying the pain. Has not gotten better or worse since onset and currently moderate in severity. It's improved with resting the foot in an elevated position.  Denies fevers, chills, or heartbeat, rapid heartbeat or chest pain.   Review Of Systems Outlined In HPI  Past Medical History  Diagnosis Date  . History of gallstones 01/20/2013    Past Surgical History  Procedure Laterality Date  . Cholecystectomy     Family History  Problem Relation Age of Onset  . Thyroid disease Mother   . Thyroid disease Sister     History   Social History  . Marital Status: Single    Spouse Name: N/A    Number of Children: N/A  . Years of Education: N/A   Occupational History  . Not on file.   Social History Main Topics  . Smoking status: Never Smoker   . Smokeless tobacco: Not on file  . Alcohol Use: 0.5 oz/week    1 drink(s) per week  . Drug Use: Yes  . Sexual Activity: Not on file   Other Topics Concern  . Not on file   Social History Narrative     Objective: BP 138/82 mmHg  Pulse 77  Ht 5' 5.5" (1.664 m)  Wt 323 lb (146.512 kg)  BMI 52.91 kg/m2  General: Alert and Oriented,  No Acute Distress HEENT: Pupils equal, round, reactive to light. Conjunctivae clear.  Moist mucous membranes Lungs: Clear and comfortable work of breathing Cardiac: Regular rate and rhythm.  Right upper extremity: Full range of motion and strength in the right elbow, no pain with palpation of the olecranon, no valgus or varus laxity. Pain with palpation of the lateral epicondyle and with resisted supination and pronation. Left foot: No pain with palpation of lateral medial malleoli. Full range of motion and strength throughout the left foot. No pain with outpatient of the inferior posterior calcaneus. Pain is reproduced with palpation of the base of the fifth metatarsal. No pain with compression of the toe box. No pain over the navicular Extremities: No peripheral edema.  Strong peripheral pulses.  Mental Status: No depression, anxiety, nor agitation. Skin: Warm and dry.  Assessment & Plan: Miamarie was seen today for elbow pain and toe pain.  Diagnoses and associated orders for this visit:  Left knee pain - traMADol (ULTRAM) 50 MG tablet; Take 1 tablet (50 mg total) by mouth every 8 (eight) hours as needed.  Left foot pain - DG Foot Complete Left; Future  Right elbow pain - DG Elbow Complete Right; Future    Please disregard left knee pain diagnosis above tramadol was provided only for left foot pain and right  elbow pain Left foot pain: Due to pain at the base of the fifth metatarsal and like to get it x-rayed to rule out avulsion or Jones fracture. Ultimate plan will be based on the results of these films. Right elbow pain felt to be due to tendinitis at the lateral epicondyle. I advised patient that x-rays not 100% warranted for this injury however she is adamant that she would like an x-ray today to rule out fracture.  25 minutes spent face-to-face during visit today of which at least 50% was counseling or coordinating care regarding: 1. Left knee pain   2. Left foot pain   3.  Right elbow pain       Return if symptoms worsen or fail to improve.

## 2014-11-01 ENCOUNTER — Emergency Department: Admit: 2014-11-01 | Primary: Family Medicine

## 2014-11-01 ENCOUNTER — Inpatient Hospital Stay
Admit: 2014-11-01 | Discharge: 2014-11-01 | Disposition: A | Discharge status: Home / Self Care | Attending: Emergency Medicine

## 2014-11-01 DIAGNOSIS — K209 Esophagitis, unspecified without bleeding: Secondary | ICD-10-CM

## 2014-11-01 LAB — CBC WITH AUTO DIFFERENTIAL
Basophils %: 0 % (ref 0–2)
Basophils Absolute: 0.02 E9/L (ref 0.00–0.20)
Eosinophils %: 2 % (ref 0–6)
Eosinophils Absolute: 0.11 E9/L (ref 0.05–0.50)
Hematocrit: 43.5 % (ref 34.0–48.0)
Hemoglobin: 14.5 g/dL (ref 11.5–15.5)
Lymphocytes %: 30 % (ref 20–42)
Lymphocytes Absolute: 2.06 E9/L (ref 1.50–4.00)
MCH: 26.8 pg (ref 26.0–35.0)
MCHC: 33.4 % (ref 32.0–34.5)
MCV: 80.4 fL (ref 80.0–99.9)
MPV: 8.9 fL (ref 7.0–12.0)
Monocytes %: 5 % (ref 2–12)
Monocytes Absolute: 0.37 E9/L (ref 0.10–0.95)
Neutrophils %: 63 % (ref 43–80)
Neutrophils Absolute: 4.35 E9/L (ref 1.80–7.30)
Platelets: 247 E9/L (ref 130–450)
RBC: 5.41 E12/L (ref 3.50–5.50)
RDW: 12.5 fL (ref 11.5–15.0)
WBC: 6.9 E9/L (ref 4.5–11.5)

## 2014-11-01 LAB — COMPREHENSIVE METABOLIC PANEL
ALT: 17 U/L (ref 0–32)
AST: 34 U/L — ABNORMAL HIGH (ref 0–31)
Albumin: 4 g/dL (ref 3.5–5.2)
Alkaline Phosphatase: 216 U/L — ABNORMAL HIGH (ref 35–104)
Anion Gap: 17 mmol/L — ABNORMAL HIGH (ref 7–16)
BUN: 9 mg/dL (ref 6–20)
CO2: 20 mmol/L — ABNORMAL LOW (ref 22–29)
Calcium: 8.8 mg/dL (ref 8.6–10.2)
Chloride: 101 mmol/L (ref 98–107)
Creatinine: 0.3 mg/dL — ABNORMAL LOW (ref 0.5–1.0)
GFR African American: >60
GFR Non-African American: >60 mL/min/{1.73_m2} (ref >=60)
Glucose: 248 mg/dL — ABNORMAL HIGH (ref 74–109)
Potassium: 3.8 mmol/L (ref 3.5–5.0)
Sodium: 138 mmol/L (ref 132–146)
Total Bilirubin: 0.4 mg/dL (ref 0.0–1.2)
Total Protein: 6.4 g/dL (ref 6.4–8.3)

## 2014-11-01 LAB — POC PREGNANCY UR-QUAL: Preg Test, Ur: NEGATIVE

## 2014-11-01 LAB — LIPASE: Lipase: 59 U/L (ref 13–60)

## 2014-11-01 LAB — AMYLASE: Amylase: 33 U/L (ref 20–100)

## 2014-11-01 MED ORDER — ONDANSETRON HCL 4 MG PO TABS
4 MG | ORAL_TABLET | Freq: Three times a day (TID) | ORAL | Status: AC | PRN
Start: 2014-11-01 — End: 2014-11-06

## 2014-11-01 MED ORDER — SUCRALFATE 1 G PO TABS
1 GM | ORAL_TABLET | Freq: Four times a day (QID) | ORAL | Status: DC
Start: 2014-11-01 — End: 2014-11-10

## 2014-11-01 MED ORDER — SODIUM CHLORIDE 0.9 % IV BOLUS
0.9 % | Freq: Once | INTRAVENOUS | Status: AC
Start: 2014-11-01 — End: 2014-11-01
  Administered 2014-11-01: 13:00:00 1000 mL via INTRAVENOUS

## 2014-11-01 MED ORDER — PANTOPRAZOLE SODIUM 40 MG IV SOLR
40 MG | Freq: Once | INTRAVENOUS | Status: AC
Start: 2014-11-01 — End: 2014-11-01
  Administered 2014-11-01: 13:00:00 40 mg via INTRAVENOUS

## 2014-11-01 MED ORDER — HYDROCODONE-ACETAMINOPHEN 5-325 MG PO TABS
5-325 MG | ORAL_TABLET | Freq: Four times a day (QID) | ORAL | Status: DC | PRN
Start: 2014-11-01 — End: 2015-11-20

## 2014-11-01 MED FILL — PROTONIX 40 MG IV SOLR: 40 MG | INTRAVENOUS | Qty: 40

## 2014-11-01 NOTE — ED Provider Notes (Signed)
Name: Maria Valdez  DOB: 1965/11/13  MRN: [id number]    Date: 11/01/2014    Benefits of immediately proceeding with Radiology exam outweigh the risks and therefore the following is being waived:      [x]  Pregnancy test    []  Protocol for Iodine allergy    []  MRI questionnaire    []  BUN/Creatinine        Gaspar Skeetersheodore Kavi Almquist, MD          Gaspar Skeetersheodore Larrie Lucia, MD  11/01/14 1010

## 2014-11-01 NOTE — ED Notes (Signed)
Patient presents with no difficulty swallowing, verbalizing easily, in no distress.     Vedia PereyraNancy Delrae Hagey, RN  11/01/14 [phone]

## 2014-11-01 NOTE — ED Notes (Signed)
Pt states that she woke up this morning with epigastric pain that last a few minutes but it when way, then the pain stated back up in her way to the ED. She said that she in not in pain right this moment.      Fontaine NoValquiria Newlun, RN  11/01/14 [phone]

## 2014-11-01 NOTE — ED Provider Notes (Signed)
??  Department of Emergency Medicine   ED  Provider Note  Admit Date/RoomTime: 11/01/2014  8:13 AM  ED Room: 10/10      History of Present Illness:  11/01/14, Time: 8:24 AM         Maria Valdez is a 49 y.o. female presenting to the ED for foreign body, beginning 2 hours ago.  The complaint has been persistent, moderate in severity, and worsened by nothing.  Pt reports that she feels like she has something stuck in her lower esophagus. Pt states she took a pill 3 hours ago for dental pain and woke up with this feeling. Pt has no trouble drinking or breathing currently. Pt reports this has never happened before. Pt denies any nausea, vomiting, diarrhea, chest pain, shortness of breath, fever, chills, or dizziness.    Review of Systems:   Pertinent positives and negatives are stated within HPI, all other systems reviewed and are negative.        --------------------------------------------- PAST HISTORY ---------------------------------------------  Past Medical History:  has a past medical history of HIV infection (HCC) and HTN (hypertension).    Past Surgical History:  has past surgical history that includes Thyroid surgery and Cholecystectomy.    Social History:  reports that she has been smoking.  She has never used smokeless tobacco. She reports that she does not drink alcohol or use illicit drugs.    Family History: family history is not on file.     The patient???s home medications have been reviewed.    Allergies: Sulfa antibiotics and Codeine        ---------------------------------------------------PHYSICAL EXAM--------------------------------------    Constitutional/General: Alert and oriented x3, well appearing, non toxic in NAD  Head: NC/AT  Eyes: PERRL, EOMI  Mouth: Oropharynx clear, handling secretions, no trismus  Neck: Supple, full ROM,   Pulmonary: Lungs clear to auscultation bilaterally, no wheezes, rales, or rhonchi. Not in respiratory distress  Cardiovascular:  Regular rate and rhythm, no  murmurs, gallops, or rubs. 2+ distal pulses  Abdomen: Soft, non tender, non distended,   Musculoskeletal: Moves all extremities x 4. Warm and well perfused, no clubbing, cyanosis, or edema. Capillary refill <3 seconds  Extremities: Moves all extremities x 4. Warm and well perfused  Skin: warm and dry without rash  Neurologic: GCS 15,  Psych: Normal Affect      -------------------------------------------------- RESULTS -------------------------------------------------  I have personally reviewed all laboratory and imaging results for this patient. Results are listed below.     LABS:  No results found for this visit on 11/01/14.    RADIOLOGY:  Interpreted by Radiologist.  No orders to display       ------------------------- NURSING NOTES AND VITALS REVIEWED ---------------------------   The nursing notes within the ED encounter and vital signs as below have been reviewed by myself.  BP 152/77 mmHg   Pulse 93   Temp(Src) 98 ??F (36.7 ??C) (Oral)   Resp 20   Ht  (1.575 m)   Wt 125 lb (56.7 kg)   BMI 22.86 kg/m2   SpO2 95%   LMP 10/03/2014   Breastfeeding? Unknown  Oxygen Saturation Interpretation: Normal    The patient???s available past medical records and past encounters were reviewed.        ------------------------------ ED COURSE/MEDICAL DECISION MAKING----------------------  Medications - No data to display      Medical Decision Making:    Foreign body sensation after taking pill for dental pain.    This patient's ED course included:  a personal history and physicial examination    This patient has remained hemodynamically stable during their ED course.    Re-Evaluations:             Re-evaluation.  Patient???s symptoms are improving      Consultations:             Time: [id number].   Spoke with Dr. Clayburn PertEvan (Surgery).  Discussed case.  They will provide consultation.      Critical Care: n/a      Counseling:   The emergency provider has spoken with the patient and discussed today???s results, in addition to providing specific  details for the plan of care and counseling regarding the diagnosis and prognosis.  Questions are answered at this time and they are agreeable with the plan.       --------------------------------- IMPRESSION AND DISPOSITION ---------------------------------    IMPRESSION  No diagnosis found.    DISPOSITION  Disposition: Discharge to home  Patient condition is stable    11/01/14, 8:24 AM.    This note is prepared by Drema Halonyler Denmeade, acting as Scribe for Gaspar Skeetersheodore Arkin Imran, MD.    Gaspar Skeetersheodore Rhianna Raulerson, MD:  The scribe's documentation has been prepared under my direction and personally reviewed by me in its entirety.           Gaspar Skeetersheodore Yesica Kemler, MD  01/12/15 [phone]

## 2014-11-01 NOTE — ED Notes (Signed)
Continues to be in no distress.    Vedia PereyraNancy Haileigh Pitz, RN  11/01/14 [phone]

## 2014-11-01 NOTE — ED Notes (Signed)
To CT    Vedia PereyraNancy Shantice Menger, RN  11/01/14 1020

## 2014-11-01 NOTE — ED Notes (Signed)
Bed: 10  Expected date:   Expected time:   Means of arrival:   Comments:  Esophageal obstruction??

## 2014-11-10 ENCOUNTER — Inpatient Hospital Stay
Admit: 2014-11-10 | Discharge: 2014-11-11 | Disposition: A | Discharge status: Other Acute Inpatient Hospital | Attending: Emergency Medicine

## 2014-11-10 ENCOUNTER — Emergency Department: Admit: 2014-11-11 | Primary: Family Medicine

## 2014-11-10 ENCOUNTER — Encounter: Admit: 2014-11-10 | Primary: Family Medicine

## 2014-11-10 DIAGNOSIS — E0810 Diabetes mellitus due to underlying condition with ketoacidosis without coma: Secondary | ICD-10-CM

## 2014-11-10 LAB — EKG 12-LEAD
Atrial Rate: 117 {beats}/min
P Axis: 48 degrees
P-R Interval: 124 ms
Q-T Interval: 326 ms
QRS Duration: 78 ms
QTc Calculation (Bazett): 454 ms
R Axis: 62 degrees
T Axis: 39 degrees
Ventricular Rate: 117 {beats}/min

## 2014-11-10 NOTE — ED Notes (Signed)
Pt pushed call light. Upon entering room pt is sitting up on bed and hyperventilating. Pt states that she is unable to catch her breath. Pt coached on slowing her breathing down. Shortly after she relaxed and stated that she feels better. Dr notified    Augusto Garbearlos A Rodriguez, RN  11/10/14 2226

## 2014-11-10 NOTE — ED Provider Notes (Addendum)
Name: Maria Valdez  DOB: 1966-05-31  MRN: [id number]    Date: 11/11/2014    Benefits of immediately proceeding with Radiology exam outweigh the risks and therefore the following is being waived:      [x]  Pregnancy test    []  Protocol for Iodine allergy    []  MRI questionnaire    []  BUN/Creatinine        Gaspar Skeetersheodore Ziyanna Tolin, MD          Gaspar Skeetersheodore Shalon Salado, MD  11/11/14 [phone]    Gaspar Skeetersheodore Enez Monahan, MD  11/11/14 [phone]    Gaspar Skeetersheodore Tayquan Gassman, MD  11/11/14 [phone]

## 2014-11-10 NOTE — ED Provider Notes (Signed)
FIRST PROVIDER CONTACT ASSESSMENT NOTE   ??  Department of Emergency Medicine   Admit Date: No admission date for patient encounter.    Chief Complaint: No chief complaint on file.      History of Present Illness:    Maria Valdez is a 49 y.o. female who presents to the ED for goiter, dry mouth, difficulty swallowing, unable to sleep, chest tightness x 2-3 weeks. The patient was supposed to see a specialist about the thyroid but canceled her appointment, and it was rescheduled for 11/16/2014. + hyperthyroid. The patient feels like the goiter is enlarging, causing difficulty swallowing.      -----------------END OF FIRST PROVIDER CONTACT ASSESSMENT NOTE--------------  Electronically signed by Casimer LeekMelissa M Missey Hasley, PA   DD: 11/10/14              Casimer LeekMelissa M Nikhita Mentzel, PA  11/10/14 [phone]

## 2014-11-10 NOTE — ED Provider Notes (Signed)
??  Department of Emergency Medicine   ED  Provider Note  Admit Date/RoomTime: 11/10/2014  7:20 PM  ED Room: 12/12      History of Present Illness:  11/10/14, Time: 8:09 PM         Maria Valdez is a 49 y.o. female presenting to the ED for dysphagia, beginning 2 weeks ago.  The complaint has been persistent, moderate in severity, and worsened by swallowing. Pt reports that she was seen last week for dry mouth and trouble swallowing after having a tooth pulled. States that her symptoms feel worse. States that she feels like her thyroid is bigger and has shifted to the left. Reports that she is scheduled to see a specialist for her thyroid next week. Pt also reports that she has nausea, blurred vision, and anxiety. Reports that she had a CT scan of her thyroid in the past. Has hx of HIV and hypertension. Denies shortness of breath, fever, chills, abdominal pain, emesis, diarrhea, constipation, and further complaints.     Review of Systems:   Pertinent positives and negatives are stated within HPI, all other systems reviewed and are negative.        --------------------------------------------- PAST HISTORY ---------------------------------------------  Past Medical History:  has a past medical history of HIV infection (HCC) and HTN (hypertension).    Past Surgical History:  has a past surgical history that includes Thyroid surgery and Cholecystectomy.    Social History:  reports that she has been smoking Cigarettes.  She has been smoking about 0.50 packs per day. She has never used smokeless tobacco. She reports that she does not drink alcohol or use illicit drugs.    Family History: family history is not on file.     The patient???s home medications have been reviewed.    Allergies: Sulfa antibiotics and Codeine        ---------------------------------------------------PHYSICAL EXAM--------------------------------------    Constitutional/General: Alert and oriented x3, well appearing, non toxic in NAD  Head:  Normocephalic and atraumatic  Eyes: PERRL, EOMI, conjunctiva normal, sclera non icteric  Mouth: Oropharynx clear, handling secretions, no trismus, no asymmetry of the posterior oropharynx or uvular edema  Neck: Supple, full ROM, non tender to palpation in the midline, no stridor, has moderate fullness to the left anterior neck  Respiratory: Lungs clear to auscultation bilaterally, no wheezes, rales, or rhonchi. Not in respiratory distress  Cardiovascular:  Regular rate. Regular rhythm. No murmurs, gallops, or rubs. 2+ distal pulses  Chest: No chest wall tenderness  GI:  Abdomen Soft, Non tender, Non distended.  +BS.   No rebound, guarding, or rigidity. No pulsatile masses.  Musculoskeletal: Moves all extremities x 4. Warm and well perfused, no clubbing, cyanosis, or edema. Capillary refill <3 seconds  Integument: skin warm and dry. No rashes.   Lymphatic: no lymphadenopathy noted  Neurologic: GCS 15, no focal deficits, symmetric strength 5/5 in the upper and lower extremities bilaterally  Psychiatric: Normal Affect      -------------------------------------------------- RESULTS -------------------------------------------------  I have personally reviewed all laboratory and imaging results for this patient. Results are listed below.     LABS:  Results for orders placed or performed during the hospital encounter of 11/10/14   Culture Blood #1   Result Value Ref Range    Blood Culture, Routine 5 Days- no growth    Culture Blood #2   Result Value Ref Range    Culture, Blood 2 5 Days- no growth    Basic metabolic panel   Result  Value Ref Range    Sodium 128 (L) 132 - 146 mmol/L    Potassium 4.3 3.5 - 5.0 mmol/L    Chloride 88 (L) 98 - 107 mmol/L    CO2 14 (L) 22 - 29 mmol/L    Anion Gap 26 (H) 7 - 16 mmol/L    Glucose 327 (H) 74 - 109 mg/dL    BUN 6 6 - 20 mg/dL    CREATININE 0.3 (L) 0.5 - 1.0 mg/dL    GFR Non-African American >60 >=60 mL/min/1.73    GFR African American >60     Calcium 9.8 8.6 - 10.2 mg/dL   CBC    Result Value Ref Range    WBC 8.5 4.5 - 11.5 E9/L    RBC 6.26 (H) 3.50 - 5.50 E12/L    Hemoglobin 16.8 (H) 11.5 - 15.5 g/dL    Hematocrit 95.2 (H) 34.0 - 48.0 %    MCV 81.9 80.0 - 99.9 fL    MCH 26.8 26.0 - 35.0 pg    MCHC 32.7 32.0 - 34.5 %    RDW 12.4 11.5 - 15.0 fL    Platelets 277 130 - 450 E9/L    MPV 8.6 7.0 - 12.0 fL   Troponin I   Result Value Ref Range    Troponin <0.01 0.00 - 0.03 ng/mL   Hepatic Function Panel   Result Value Ref Range    Total Protein 7.8 6.4 - 8.3 g/dL    Alb 4.7 3.5 - 5.2 g/dL    Alkaline Phosphatase 286 (H) 35 - 104 U/L    ALT 20 0 - 32 U/L    AST 18 0 - 31 U/L    Total Bilirubin 0.4 0.0 - 1.2 mg/dL    Bilirubin, Direct <8.4 0.0 - 0.3 mg/dL    Bilirubin, Indirect 0.2 0.0 - 1.0 mg/dL   TSH without Reflex   Result Value Ref Range    TSH 0.013 (L) 0.270 - 4.200 uIU/mL   T4, free   Result Value Ref Range    T4 Free 3.87 (H) 0.93 - 1.70 ng/dL   Amylase   Result Value Ref Range    Amylase 30 20 - 100 U/L   Lipase   Result Value Ref Range    Lipase 49 13 - 60 U/L   Lactic Acid, Plasma   Result Value Ref Range    Lactic Acid 1.1 0.5 - 2.2 mmol/L   Acetone   Result Value Ref Range    Acetone, Bld 60 mg/dL    Blood Gas, Arterial   Result Value Ref Range    Date Analyzed [id number]     Time Analyzed 0037     Source: Blood Arterial     pH, Blood Gas 7.243 (L) 7.350 - 7.450    PCO2 28.1 (L) 35.0 - 45.0 mmHg    PO2 100.5 (H) 60.0 - 100.0 mmHg    HCO3 11.9 (L) 22.0 - 26.0 mmol/L    B.E. -13.8 (L) -3.0 - 3.0 mmol/L    O2 Sat 97.2 92.0 - 98.5 %    O2Hb 94.3 94.0 - 97.0 %    COHb 2.5 (H) 0.0 - 1.5 %    MetHb 0.5 0.0 - 1.5 %    O2 Content 20.8 mL/dL    HHb 2.7 0.0 - 5.0 %    tHb (est) 15.6 11.5 - 16.5 g/dL    Mode RA     Date Of Collection [id number]     Time  Collected 0020     Pt Temp 37.0 C    Operator ID [id number]     Lab [id number]     Critical(s) Notified . No Critical Values    Basic Metabolic Panel   Result Value Ref Range    Sodium 132 132 - 146 mmol/L    Potassium 4.1 3.5 - 5.0 mmol/L    Chloride 97 (L)  98 - 107 mmol/L    CO2 14 (L) 22 - 29 mmol/L    Anion Gap 21 (H) 7 - 16 mmol/L    Glucose 361 (H) 74 - 109 mg/dL    BUN 5 (L) 6 - 20 mg/dL    CREATININE 0.3 (L) 0.5 - 1.0 mg/dL    GFR Non-African American >60 >=60 mL/min/1.73    GFR African American >60     Calcium 9.1 8.6 - 10.2 mg/dL   Basic metabolic panel   Result Value Ref Range    Sodium 133 132 - 146 mmol/L    Potassium 3.0 (L) 3.5 - 5.0 mmol/L    Chloride 101 98 - 107 mmol/L    CO2 18 (L) 22 - 29 mmol/L    Anion Gap 14 7 - 16 mmol/L    Glucose 389 (H) 74 - 109 mg/dL    BUN 3 (L) 6 - 20 mg/dL    CREATININE 0.3 (L) 0.5 - 1.0 mg/dL    GFR Non-African American >60 >=60 mL/min/1.73    GFR African American >60     Calcium 8.9 8.6 - 10.2 mg/dL   POCT Glucose   Result Value Ref Range    Glucose 287 mg/dL    QC OK? yes    POCT Glucose   Result Value Ref Range    Meter Glucose 287 (H) 70 - 110 mg/dL   POCT Glucose   Result Value Ref Range    Meter Glucose 250 (H) 70 - 110 mg/dL   POCT Glucose   Result Value Ref Range    Meter Glucose 346 (H) 70 - 110 mg/dL   POCT Glucose   Result Value Ref Range    Meter Glucose 418 (H) 70 - 110 mg/dL   POCT Glucose   Result Value Ref Range    Meter Glucose 388 (H) 70 - 110 mg/dL   EKG 12 Lead   Result Value Ref Range    Ventricular Rate 117 BPM    Atrial Rate 117 BPM    P-R Interval 124 ms    QRS Duration 78 ms    Q-T Interval 326 ms    QTc Calculation (Bazett) 454 ms    P Axis 48 degrees    R Axis 62 degrees    T Axis 39 degrees       RADIOLOGY:  Interpreted by Radiologist.  CT Soft Tissue Neck WO Contrast   Final Result   IMPRESSION:        1.  Left thyromegaly.  Recommend followup.   2.  Minimal coxal tracheal deviation to the right.     3.  Minimal emphysematous lung disease.         This Final report was electronically signed by Loann Quill, MD on 10 Nov 2014 11:13 PM EDT.      XR Chest Portable   Final Result   IMPRESSION:     Normal chest.               EKG:  This EKG is signed and interpreted by the EP.  Time:  19:53    Rhythm: sinus tachycardia  Rate: 117  Axis: normal  Conduction: normal  ST Segments: nonspecific changes  T Waves: non specific changes    Clinical Impression: sinus tachycardia, abnormal ST changes  Comparison to prior EKG: changes compared to previous EKG      ------------------------- NURSING NOTES AND VITALS REVIEWED ---------------------------   The nursing notes within the ED encounter and vital signs as below have been reviewed by myself.  Visit Vitals   ??? BP 153/75   ??? Pulse 111   ??? Temp 98.1 ??F (36.7 ??C) (Oral)   ??? Resp 16   ??? Ht 5\' 2"  (1.575 m)   ??? Wt 125 lb (56.7 kg)   ??? LMP 09/30/2014   ??? SpO2 97%   ??? BMI 22.86 kg/m2     Oxygen Saturation Interpretation: Normal    The patient???s available past medical records and past encounters were reviewed.        ------------------------------ ED COURSE/MEDICAL DECISION MAKING----------------------  Medications   0.9 % sodium chloride bolus (0 mLs Intravenous Stopped 11/10/14 2223)   LORazepam (ATIVAN) injection 1 mg (1 mg Intravenous Given 11/10/14 2121)   0.9 % sodium chloride bolus (0 mLs Intravenous Stopped 11/11/14 0320)         Medical Decision Making:   Pt's ED course will include  Radiology: chest XR and CT of soft tissue.  Lab work:  BMP, CBC, troponin, TSH, T4, amylase, lipase, lactic acid, Hepatic function panel  Medication: IV fluids, insulin, D50,  and ativan       This patient's ED course included: a personal history and physicial examination, re-evaluation prior to disposition and IV medications    This patient has remained hemodynamically stable during their ED course.    Re-Evaluations:           Time 12:39 am  Re-evaluation.  Patient???s symptoms slightly improved, will medicate.    Time 1:39 am  Patient is stable. Discussed results. Upon re-examination, patient is resting, NAD.     Consultations:             Time: 1:34 am.   Spoke with Dr. Molli Knock, DO (Medicine).  Discussed case.  They will provide consultation and states that pt  might need to be transferred to another hospital because there isn't a endocrinologist that comes to [facility]. [place] facilities.    Time: 1:57 am.   Spoke with Dr. Annetta Maw (Medicine at [facility]).  Discussed case.  They will admit this patient and will provide consultation.    Critical Care: Please note that the withdrawal or failure to initiate urgent interventions for this patient would likely result in a life threatening deterioration or permanent disability.      Accordingly this patient received 45 minutes of critical care time, excluding separately billable procedures.        Counseling:   The emergency provider has spoken with the patient and discussed today???s results, in addition to providing specific details for the plan of care and counseling regarding the diagnosis and prognosis.  Questions are answered at this time and they are agreeable with the plan.       --------------------------------- IMPRESSION AND DISPOSITION ---------------------------------    IMPRESSION  1. 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 Hyperthyroidism        DISPOSITION  Disposition: Transfer to [facility]   Patient condition is stable    11/10/14,  8:11 PM.    This note is prepared by Tildon HuskyBrittany Clark acting as Scribe for Gaspar Skeetersheodore Jahni Paul, MD.    Gaspar Skeetersheodore Jaelyne Deeg, MD:  The scribe's documentation has been prepared under my direction and personally reviewed by me in its entirety.  I confirm that the note above accurately reflects all work, treatment, procedures, and medical decision making performed by me.                     Gaspar Skeetersheodore Shayn Madole, MD  01/12/15 [phone]

## 2014-11-10 NOTE — ED Notes (Signed)
Ct exam delayed due to no hcg or waiver. adb

## 2014-11-11 LAB — CBC
Hematocrit: 51.3 % — ABNORMAL HIGH (ref 34.0–48.0)
Hemoglobin: 16.8 g/dL — ABNORMAL HIGH (ref 11.5–15.5)
MCH: 26.8 pg (ref 26.0–35.0)
MCHC: 32.7 % (ref 32.0–34.5)
MCV: 81.9 fL (ref 80.0–99.9)
MPV: 8.6 fL (ref 7.0–12.0)
Platelets: 277 E9/L (ref 130–450)
RBC: 6.26 E12/L — ABNORMAL HIGH (ref 3.50–5.50)
RDW: 12.4 fL (ref 11.5–15.0)
WBC: 8.5 E9/L (ref 4.5–11.5)

## 2014-11-11 LAB — POCT GLUCOSE
Glucose: 287 mg/dL
Meter Glucose: 287 mg/dL — ABNORMAL HIGH (ref 70–110)
Meter Glucose: 250 mg/dL — ABNORMAL HIGH (ref 70–110)
Meter Glucose: 346 mg/dL — ABNORMAL HIGH (ref 70–110)
Meter Glucose: 418 mg/dL — ABNORMAL HIGH (ref 70–110)
Meter Glucose: 388 mg/dL — ABNORMAL HIGH (ref 70–110)

## 2014-11-11 LAB — BLOOD GAS, ARTERIAL
B.E.: -13.8 mmol/L — ABNORMAL LOW (ref <=3.0)
COHb: 2.5 % — ABNORMAL HIGH (ref 0.0–1.5)
Date Analyzed: 20160429
Date Of Collection: 20160429
HCO3: 11.9 mmol/L — ABNORMAL LOW (ref 22.0–26.0)
HHb: 2.7 % (ref 0.0–5.0)
Lab: 7921
MetHb: 0.5 % (ref 0.0–1.5)
O2 Content: 20.8 mL/dL
O2 Sat: 97.2 % (ref 92.0–98.5)
O2Hb: 94.3 % (ref 94.0–97.0)
Operator ID: 318600
PCO2: 28.1 mmHg — ABNORMAL LOW (ref 35.0–45.0)
PO2: 100.5 mmHg — ABNORMAL HIGH (ref 60.0–100.0)
Pt Temp: 37 C
Time Analyzed: 37
Time Collected: 20
pH, Blood Gas: 7.243 — ABNORMAL LOW (ref 7.350–7.450)
tHb (est): 15.6 g/dL (ref 11.5–16.5)

## 2014-11-11 LAB — BASIC METABOLIC PANEL
Anion Gap: 26 mmol/L — ABNORMAL HIGH (ref 7–16)
Anion Gap: 21 mmol/L — ABNORMAL HIGH (ref 7–16)
Anion Gap: 14 mmol/L (ref 7–16)
BUN: 6 mg/dL (ref 6–20)
BUN: 5 mg/dL — ABNORMAL LOW (ref 6–20)
BUN: 3 mg/dL — ABNORMAL LOW (ref 6–20)
CO2: 14 mmol/L — ABNORMAL LOW (ref 22–29)
CO2: 14 mmol/L — ABNORMAL LOW (ref 22–29)
CO2: 18 mmol/L — ABNORMAL LOW (ref 22–29)
Calcium: 9.8 mg/dL (ref 8.6–10.2)
Calcium: 9.1 mg/dL (ref 8.6–10.2)
Calcium: 8.9 mg/dL (ref 8.6–10.2)
Chloride: 88 mmol/L — ABNORMAL LOW (ref 98–107)
Chloride: 97 mmol/L — ABNORMAL LOW (ref 98–107)
Chloride: 101 mmol/L (ref 98–107)
Creatinine: 0.3 mg/dL — ABNORMAL LOW (ref 0.5–1.0)
Creatinine: 0.3 mg/dL — ABNORMAL LOW (ref 0.5–1.0)
Creatinine: 0.3 mg/dL — ABNORMAL LOW (ref 0.5–1.0)
GFR African American: >60
GFR African American: >60
GFR African American: >60
GFR Non-African American: >60 mL/min/{1.73_m2} (ref >=60)
GFR Non-African American: >60 mL/min/{1.73_m2} (ref >=60)
GFR Non-African American: >60 mL/min/{1.73_m2} (ref >=60)
Glucose: 327 mg/dL — ABNORMAL HIGH (ref 74–109)
Glucose: 361 mg/dL — ABNORMAL HIGH (ref 74–109)
Glucose: 389 mg/dL — ABNORMAL HIGH (ref 74–109)
Potassium: 4.3 mmol/L (ref 3.5–5.0)
Potassium: 4.1 mmol/L (ref 3.5–5.0)
Potassium: 3 mmol/L — ABNORMAL LOW (ref 3.5–5.0)
Sodium: 128 mmol/L — ABNORMAL LOW (ref 132–146)
Sodium: 132 mmol/L (ref 132–146)
Sodium: 133 mmol/L (ref 132–146)

## 2014-11-11 LAB — LACTIC ACID: Lactic Acid: 1.1 mmol/L (ref 0.5–2.2)

## 2014-11-11 LAB — HEPATIC FUNCTION PANEL
ALT: 20 U/L (ref 0–32)
AST: 18 U/L (ref 0–31)
Albumin: 4.7 g/dL (ref 3.5–5.2)
Alkaline Phosphatase: 286 U/L — ABNORMAL HIGH (ref 35–104)
Bilirubin, Direct: <0.2 mg/dL (ref 0.0–0.3)
Bilirubin, Indirect: 0.2 mg/dL (ref 0.0–1.0)
Total Bilirubin: 0.4 mg/dL (ref 0.0–1.2)
Total Protein: 7.8 g/dL (ref 6.4–8.3)

## 2014-11-11 LAB — TROPONIN: Troponin: <0.01 ng/mL (ref 0.00–0.03)

## 2014-11-11 LAB — TSH: TSH: 0.013 u[IU]/mL — ABNORMAL LOW (ref 0.270–4.200)

## 2014-11-11 LAB — T4, FREE: T4 Free: 3.87 ng/dL — ABNORMAL HIGH (ref 0.93–1.70)

## 2014-11-11 LAB — LIPASE: Lipase: 49 U/L (ref 13–60)

## 2014-11-11 LAB — ACETONE: Acetone, Bld: 60

## 2014-11-11 LAB — AMYLASE: Amylase: 30 U/L (ref 20–100)

## 2014-11-11 MED ORDER — INSULIN REGULAR HUMAN 100 UNIT/ML IJ SOLN
100 UNIT/ML | Freq: Once | INTRAMUSCULAR | Status: DC
Start: 2014-11-11 — End: 2014-11-11

## 2014-11-11 MED ORDER — DEXTROSE 50 % IV SOLN
50 % | INTRAVENOUS | Status: DC | PRN
Start: 2014-11-11 — End: 2014-11-11

## 2014-11-11 MED ORDER — SODIUM CHLORIDE 0.9 % IV BOLUS
0.9 % | Freq: Once | INTRAVENOUS | Status: AC
Start: 2014-11-11 — End: 2014-11-10
  Administered 2014-11-11: 01:00:00 1000 mL via INTRAVENOUS

## 2014-11-11 MED ORDER — GLUCAGON HCL RDNA (DIAGNOSTIC) 1 MG IJ SOLR
1 MG | INTRAMUSCULAR | Status: DC | PRN
Start: 2014-11-11 — End: 2014-11-11

## 2014-11-11 MED ORDER — INSULIN REGULAR HUMAN 100 UNIT/ML IJ SOLN (INSULIN MIXTURES ONLY)
100 UNIT/ML | INTRAMUSCULAR | Status: DC
Start: 2014-11-11 — End: 2014-11-11
  Administered 2014-11-11: 06:00:00 0.1 [IU]/kg/h via INTRAVENOUS

## 2014-11-11 MED ORDER — LORAZEPAM 2 MG/ML IJ SOLN
2 MG/ML | Freq: Once | INTRAMUSCULAR | Status: AC
Start: 2014-11-11 — End: 2014-11-10
  Administered 2014-11-11: 01:00:00 1 mg via INTRAVENOUS

## 2014-11-11 MED ORDER — SODIUM CHLORIDE 0.9 % IV BOLUS
0.9 % | Freq: Once | INTRAVENOUS | Status: AC
Start: 2014-11-11 — End: 2014-11-11
  Administered 2014-11-11: 05:00:00 1000 mL via INTRAVENOUS

## 2014-11-11 MED ORDER — DEXTROSE 5 % IV SOLN
5 % | INTRAVENOUS | Status: DC | PRN
Start: 2014-11-11 — End: 2014-11-11
  Administered 2014-11-11: 06:00:00 100 mL/h via INTRAVENOUS

## 2014-11-11 MED ORDER — GLUCOSE 40 % PO GEL
40 % | ORAL | Status: DC | PRN
Start: 2014-11-11 — End: 2014-11-11

## 2014-11-11 MED FILL — HUMULIN R 100 UNIT/ML IJ SOLN: 100 UNIT/ML | INTRAMUSCULAR | Qty: 1

## 2014-11-11 MED FILL — LORAZEPAM 2 MG/ML IJ SOLN: 2 MG/ML | INTRAMUSCULAR | Qty: 1

## 2014-11-11 NOTE — ED Notes (Signed)
Spoke with [facility] regarding transfer. Gave information and faxed medical necessity and face sheet. Will call back if able and with availability.    [facility]inal Healthmanda Policy, RN  11/11/14 [phone]

## 2014-11-11 NOTE — ED Notes (Signed)
[facility] called about transportation. Will call back after they speak to their supervisor.    [facility] Healthmanda Policy, RN  11/11/14 [phone]

## 2014-11-11 NOTE — ED Notes (Signed)
Pt found walking back from vending machines with Twix bar, Doritoes, peanut butter crackers and a mountain dew. 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 States that she does not care because she has not eaten today.    [facility]a Policy, RN  11/11/14 [phone]

## 2014-11-11 NOTE — ED Notes (Signed)
Transport here to pick up pt.    [facility] Policy, RN  11/11/14 [phone]

## 2014-11-11 NOTE — ED Notes (Signed)
Report to [facility]    Carlos A Rodriguez, RN  11/11/14 [phone]

## 2014-11-11 NOTE — ED Notes (Addendum)
[facility] called back and stated that she needs insurance pre authorization, that can't be called until 0900.    Corinna LinesAmanda Policy, RN  11/11/14 [phone]

## 2014-11-11 NOTE — ED Notes (Signed)
[facility] called and stated that they could transport patient but not until the afternoon.    Corinna LinesAmanda Policy, RN  11/11/14 [phone]

## 2014-11-11 NOTE — ED Notes (Signed)
[facility] called back, will transport pt with [facility]. ETA 0530.    [facility]a Policy, RN  11/11/14 [phone]

## 2014-11-16 LAB — CULTURE, BLOOD 2: Culture, Blood 2: 5

## 2014-11-16 LAB — CULTURE BLOOD #1: Blood Culture, Routine: 5

## 2015-04-13 ENCOUNTER — Encounter: Payer: Self-pay | Admitting: Family Medicine

## 2015-04-13 ENCOUNTER — Ambulatory Visit (INDEPENDENT_AMBULATORY_CARE_PROVIDER_SITE_OTHER): Payer: BLUE CROSS/BLUE SHIELD

## 2015-04-13 ENCOUNTER — Ambulatory Visit (INDEPENDENT_AMBULATORY_CARE_PROVIDER_SITE_OTHER): Payer: BLUE CROSS/BLUE SHIELD | Admitting: Family Medicine

## 2015-04-13 VITALS — BP 160/97 | HR 78 | Wt 328.0 lb

## 2015-04-13 DIAGNOSIS — M7021 Olecranon bursitis, right elbow: Secondary | ICD-10-CM

## 2015-04-13 DIAGNOSIS — M7020 Olecranon bursitis, unspecified elbow: Secondary | ICD-10-CM | POA: Insufficient documentation

## 2015-04-13 DIAGNOSIS — L03113 Cellulitis of right upper limb: Secondary | ICD-10-CM

## 2015-04-13 DIAGNOSIS — L0390 Cellulitis, unspecified: Secondary | ICD-10-CM | POA: Insufficient documentation

## 2015-04-13 DIAGNOSIS — M19021 Primary osteoarthritis, right elbow: Secondary | ICD-10-CM | POA: Diagnosis not present

## 2015-04-13 MED ORDER — DOXYCYCLINE HYCLATE 100 MG PO TABS: 100.0000 mg | ORAL_TABLET | Freq: Two times a day (BID) | ORAL | Status: DC

## 2015-04-13 MED ORDER — CEFUROXIME AXETIL 500 MG PO TABS: 500.0000 mg | ORAL_TABLET | Freq: Two times a day (BID) | ORAL | Status: DC

## 2015-04-13 NOTE — Patient Instructions (Addendum)
Thank you for coming in today. Take doxycycline and Ceftin twice daily for one week. Return Monday or Tuesday for recheck. Follow up with urgent care over the weekend if worse.  Cellulitis Cellulitis is an infection of the skin and the tissue beneath it. The infected area is usually red and tender. Cellulitis occurs most often in the arms and lower legs.  CAUSES  Cellulitis is caused by bacteria that enter the skin through cracks or cuts in the skin. The most common types of bacteria that cause cellulitis are staphylococci and streptococci. SIGNS AND SYMPTOMS   Redness and warmth.  Swelling.  Tenderness or pain.  Fever. DIAGNOSIS  Your health care provider can usually determine what is wrong based on a physical exam. Blood tests may also be done. TREATMENT  Treatment usually involves taking an antibiotic medicine. HOME CARE INSTRUCTIONS   Take your antibiotic medicine as directed by your health care provider. Finish the antibiotic even if you start to feel better.  Keep the infected arm or leg elevated to reduce swelling.  Apply a warm cloth to the affected area up to 4 times per day to relieve pain.  Take medicines only as directed by your health care provider.  Keep all follow-up visits as directed by your health care provider. SEEK MEDICAL CARE IF:   You notice red streaks coming from the infected area.  Your red area gets larger or turns dark in color.  Your bone or joint underneath the infected area becomes painful after the skin has healed.  Your infection returns in the same area or another area.  You notice a swollen bump in the infected area.  You develop new symptoms.  You have a fever. SEEK IMMEDIATE MEDICAL CARE IF:   You feel very sleepy.  You develop vomiting or diarrhea.  You have a general ill feeling (malaise) with muscle aches and pains. MAKE SURE YOU:   Understand these instructions.  Will watch your condition.  Will get help right away  if you are not doing well or get worse. Document Released: 04/10/2005 Document Revised: 11/15/2013 Document Reviewed: 09/16/2011 [facility] Patient Information 2015 [place], [place]. This information is not intended to replace advice given to you by your health care provider. Make sure you discuss any questions you have with your health care provider.   Olecranon Bursitis Bursitis is swelling and soreness (inflammation) of a fluid-filled sac (bursa) that covers and protects a joint. Olecranon bursitis occurs over the elbow.  CAUSES Bursitis can be caused by injury, overuse of the joint, arthritis, or infection.  SYMPTOMS   Tenderness, swelling, warmth, or redness over the elbow.  Elbow pain with movement. This is greater with bending the elbow.  Squeaking sound when the bursa is rubbed or moved.  Increasing size of the bursa without pain or discomfort.  Fever with increasing pain and swelling if the bursa becomes infected. HOME CARE INSTRUCTIONS   Put ice on the affected area.  Put ice in a plastic bag.  Place a towel between your skin and the bag.  Leave the ice on for 15-20 minutes each hour while awake. Do this for the first 2 days.  When resting, elevate your elbow above the level of your heart. This helps reduce swelling.  Continue to put the joint through a full range of motion 4 times per day. Rest the injured joint at other times. When the pain lessens, begin normal slow movements and usual activities.  Only take over-the-counter or prescription medicines for  pain, discomfort, or fever as directed by your caregiver.  Reduce your intake of milk and related dairy products (cheese, yogurt). They may make your condition worse. SEEK IMMEDIATE MEDICAL CARE IF:   Your pain increases even during treatment.  You have a fever.  You have heat and inflammation over the bursa and elbow.  You have a red line that goes up your arm.  You have pain with movement of your  elbow. MAKE SURE YOU:   Understand these instructions.  Will watch your condition.  Will get help right away if you are not doing well or get worse. Document Released: 07/31/2006 Document Revised: 09/23/2011 Document Reviewed: 06/16/2007 [facility] Patient Information 2015 [place], [place]. This information is not intended to replace advice given to you by your health care provider. Make sure you discuss any questions you have with your health care provider.

## 2015-04-14 ENCOUNTER — Telehealth: Payer: Self-pay

## 2015-04-14 MED ORDER — FLUCONAZOLE 150 MG PO TABS: 150.0000 mg | ORAL_TABLET | Freq: Once | ORAL | Status: DC

## 2015-04-14 NOTE — Progress Notes (Signed)
   Subjective:    I'm seeing this patient as a consultation for:  Dr. Ileene Rubens  CC: Right elbow pain  HPI: Patient was in her normal state of health on vacation on September 17. She tripped and fell onto mulch landing on her right elbow and knee. She notes the knee hurts him initially but is feeling much better now with only minimal pain. However the right elbow has continued to slowly worsen. She denies any significant immediate pain or swelling. However over the interval week and half or so she's noted the tip of her elbow become red and swollen and tender. She notes the skin is flaking. The pain has worsened everyday. She has not tried any prescription medications. She's tried some over-the-counter medicines for pain which has helped a little. No fevers chills nausea vomiting or diarrhea.  Past medical history, Surgical history, Family history not pertinant except as noted below, Social history, Allergies, and medications have been entered into the medical record, reviewed, and no changes needed.   Review of Systems: No headache, visual changes, nausea, vomiting, diarrhea, constipation, dizziness, abdominal pain, skin rash, fevers, chills, night sweats, weight loss, swollen lymph nodes, body aches, joint swelling, muscle aches, chest pain, shortness of breath, mood changes, visual or auditory hallucinations.   Objective:    Filed Vitals:   04/13/15 1559  BP: 160/97  Pulse: 78   General: Well Developed, well nourished, and in no acute distress. Morbidly obese Neuro/Psych: Alert and oriented x3, extra-ocular muscles intact, able to move all 4 extremities, sensation grossly intact. Skin: Warm and dry.  Erythematous scaling  extensor elbow Respiratory: Not using accessory muscles, speaking in full sentences, trachea midline.  Cardiovascular: Pulses palpable, no extremity edema. Abdomen: Does not appear distended. MSK: Right elbow: Erythematous tender indurated skin olecranon. Possible  fluctuance palpated deep. Motion is normal otherwise and nontender in the radial head or medial elbow. Pulses capillary refill sensation intact distally. Normal gait.  Right elbow x-ray pending: My interpretation shows no obvious fractures or joint effusion.  No results found for this or any previous visit (from the past 24 hour(s)). No results found.  Impression and Recommendations:   This case required medical decision making of moderate complexity.

## 2015-04-14 NOTE — Telephone Encounter (Signed)
Valerie Daniel called to report she did not receive the yeast infection medication. She would like it sent to [facility].

## 2015-04-14 NOTE — Assessment & Plan Note (Addendum)
Patient has posttraumatic possibly septic olecranon bursitis. Certainly she has overlying skin cellulitis. I am very reluctant to aspirate the possible olecranon bursitis because of the overlying cellulitis. If the bursitis is simply posttraumatic and not septic I will make it septic by aspirating fluid. Trial of oral antibiotics. Return early next week for recheck. Warning signs Discussed.

## 2015-04-14 NOTE — Assessment & Plan Note (Signed)
Certainly patient has skin cellulitis. Treat with doxycycline and Ceftin. Return next week for recheck.

## 2015-04-14 NOTE — Progress Notes (Signed)
Quick Note:  Xray did not show fracture. ______

## 2015-04-14 NOTE — Telephone Encounter (Signed)
Rx sent to pharmacy. Please contact pt and inform her.

## 2015-04-14 NOTE — Telephone Encounter (Signed)
Left detailed VM notifying pt.

## 2015-04-19 ENCOUNTER — Ambulatory Visit (INDEPENDENT_AMBULATORY_CARE_PROVIDER_SITE_OTHER): Payer: BLUE CROSS/BLUE SHIELD | Admitting: Family Medicine

## 2015-04-19 ENCOUNTER — Encounter: Payer: Self-pay | Admitting: Family Medicine

## 2015-04-19 VITALS — BP 153/85 | HR 73 | Wt 328.0 lb

## 2015-04-19 DIAGNOSIS — M7021 Olecranon bursitis, right elbow: Secondary | ICD-10-CM | POA: Diagnosis not present

## 2015-04-19 DIAGNOSIS — L03113 Cellulitis of right upper limb: Secondary | ICD-10-CM

## 2015-04-19 NOTE — Assessment & Plan Note (Signed)
Persistent despite oral antibiotics. Unclear if this is septic versus posttraumatic. Cell count differential and culture pending. Return in one week. Continue oral antibiotics

## 2015-04-19 NOTE — Patient Instructions (Addendum)
Thank you for coming in today. Continue the antibiotics.  Return in 1 week.  Call or go to the emergency room if you get worse, have trouble breathing, have chest pains, or palpitations.

## 2015-04-19 NOTE — Progress Notes (Signed)
Valerie Daniel is a 49 y.o. female who presents to [facility]  today for follow-up right olecranon bursitis. Patient was seen last week for post traumatic olecranon bursitis with overlying cellulitis versus possible septic olecranon bursitis. She was treated with oral Ceftin and doxycycline. She states that she is improved but her pain is still persisting and she still has a fluctuant mass overlying her elbow. She denies any fevers chills nausea vomiting or diarrhea.   Past Medical History  Diagnosis Date  . History of gallstones 01/20/2013   Past Surgical History  Procedure Laterality Date  . Cholecystectomy     Social History  Substance Use Topics  . Smoking status: Never Smoker   . Smokeless tobacco: Not on file  . Alcohol Use: 0.5 oz/week    1 drink(s) per week   family history includes Thyroid disease in her mother and sister.  ROS as above Medications: Current Outpatient Prescriptions  Medication Sig Dispense Refill  . cefUROXime (CEFTIN) 500 MG tablet Take 1 tablet (500 mg total) by mouth 2 (two) times daily with a meal. 14 tablet 0  . cetirizine (ZYRTEC) 10 MG tablet Take 1 tablet (10 mg total) by mouth daily. 30 tablet 6  . diphenoxylate-atropine (LOMOTIL) 2.5-0.025 MG per tablet Take 1 tablet by mouth 4 (four) times daily as needed for diarrhea or loose stools. Use only as needed. 60 tablet 0  . doxycycline (VIBRA-TABS) 100 MG tablet Take 1 tablet (100 mg total) by mouth 2 (two) times daily. 14 tablet 0  . fluconazole (DIFLUCAN) 150 MG tablet Take 1 tablet (150 mg total) by mouth once. 1 tablet 0  . hyoscyamine (LEVSIN, ANASPAZ) 0.125 MG tablet Take 1 tablet (0.125 mg total) by mouth every 4 (four) hours as needed for cramping. 30 tablet 0  . mometasone (NASONEX) 50 MCG/ACT nasal spray Place 2 sprays into the nose daily. 17 g 6  . NEXIUM 40 MG capsule TAKE 1 CAPSULE (40 MG TOTAL) BY MOUTH DAILY. 30 capsule 0   No current  facility-administered medications for this visit.   Allergies  Allergen Reactions  . Asa [Aspirin]   . Codeine      Exam:  BP 153/85 mmHg  Pulse 73  Wt 328 lb (148.78 kg) Gen: Well NAD HEENT: EOMI,  MMM Lungs: Normal work of breathing. CTABL Heart: RRR no MRG Abd: NABS, Soft. Nondistended, Nontender Exts: Brisk capillary refill, warm and well perfused.  Skin: Right elbow improved erythema. Tenderness is improved. Scaling skin is still present. Right elbow: Fluctuant mildly tender mass right olecranon.  Aspiration of right olecranon bursa: Consent obtained timeout performed and risks discussed.  Skin cleaned with chlorhexidine solution. Cold spray applied. 18-gauge needle was used to access the olecranon bursa and a milliliter of cloudy fluid was aspirated. Band-Aid applied. Patient tolerated the procedure well.  No results found for this or any previous visit (from the past 24 hour(s)). No results found.   Please see individual assessment and plan sections.

## 2015-04-20 LAB — SYNOVIAL CELL COUNT + DIFF, W/ CRYSTALS
CRYSTALS FLUID: NONE SEEN
EOSINOPHILS-SYNOVIAL: 0 % (ref 0–1)
LYMPHOCYTES-SYNOVIAL FLD: 11 % (ref 0–20)
MONOCYTE/MACROPHAGE: 0 % — AB (ref 50–90)
NEUTROPHIL, SYNOVIAL: 89 % — AB (ref 0–25)
WBC, SYNOVIAL: 20900 uL — AB (ref 0–200)

## 2015-04-23 LAB — BODY FLUID CULTURE
GRAM STAIN: NONE SEEN
ORGANISM ID, BACTERIA: NO GROWTH

## 2015-04-23 IMAGING — CR DG FOOT COMPLETE 3+V*L*
3 series · 3 of 3 positions shown · non-contrast
Comparison: None.

CLINICAL DATA: Persistent left lateral foot pain after falling from
stairs 4 days ago.

EXAM:
LEFT FOOT - COMPLETE 3+ VIEW

[view not recorded (1 of 3)]
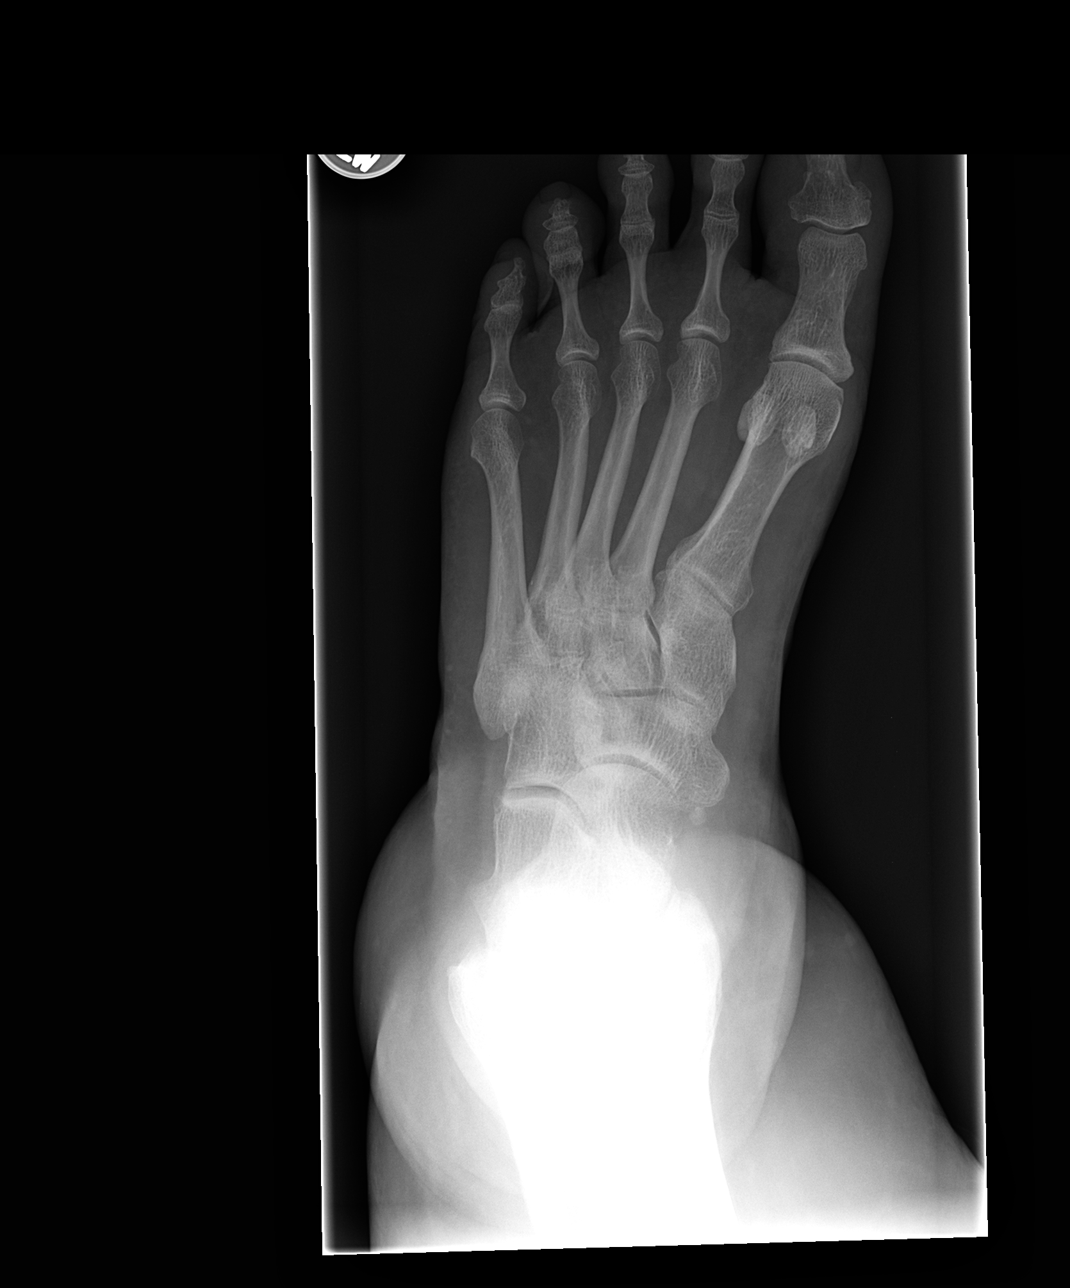

[view not recorded (2 of 3)]
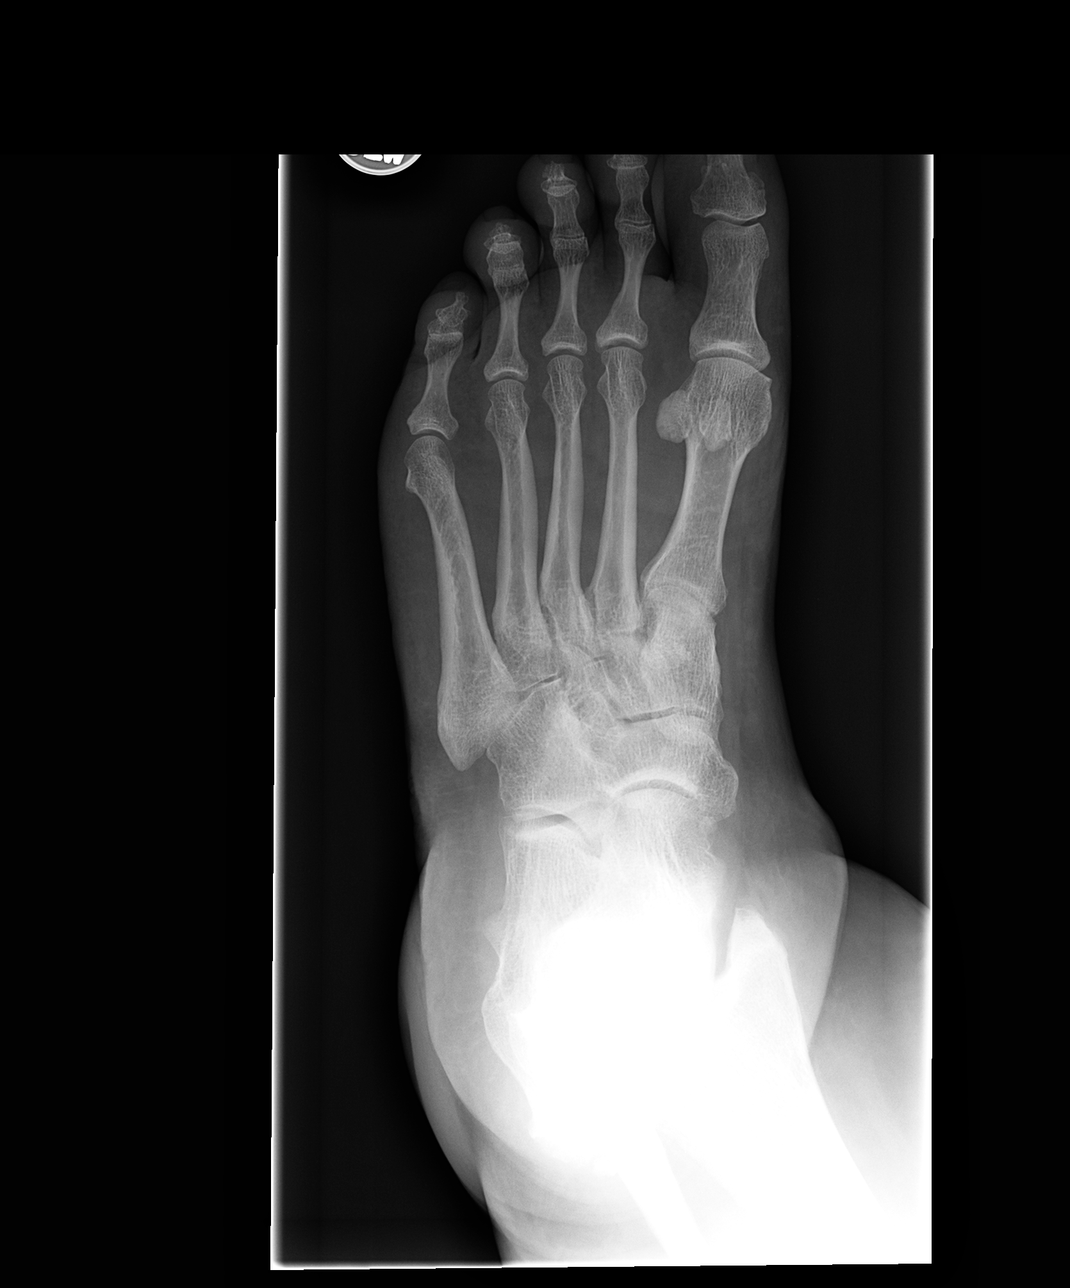

[view not recorded (3 of 3)]
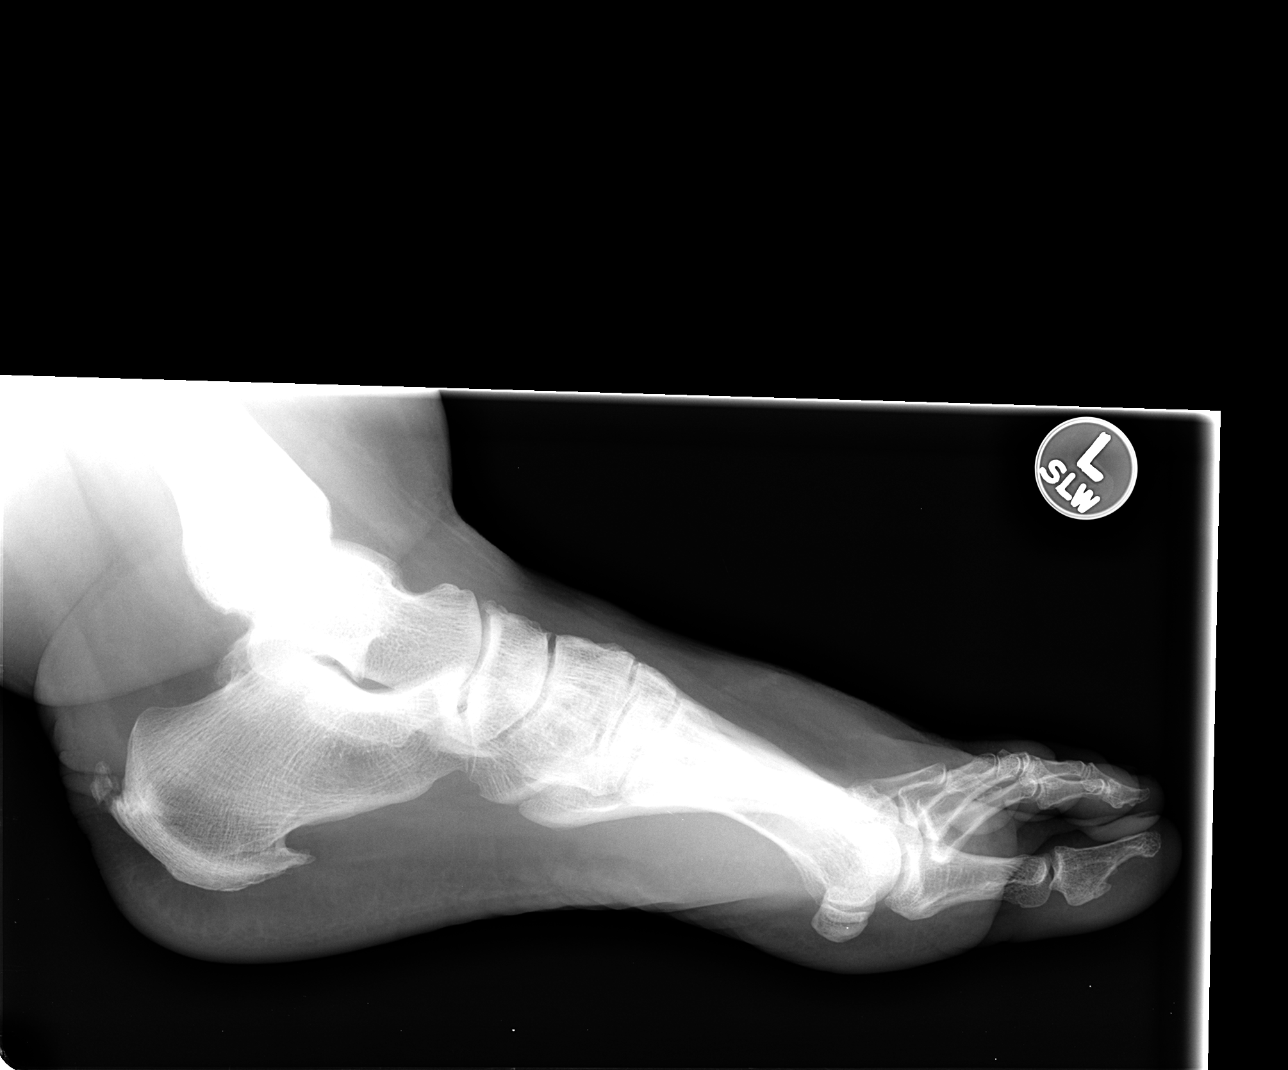

[3 of 3 positions shown; findings below may reference images not displayed]

FINDINGS: The bones of the left foot are adequately mineralized. There is no
acute fracture nor dislocation. There is no significant degenerative
change. There is soft tissue swelling over the midfoot. There are
plantar and Achilles region calcaneal spurs.
IMPRESSION: There is no acute bony abnormality of the left foot. Specific
attention to the fifth metatarsal reveals no acute bony abnormality.

## 2015-04-23 IMAGING — CR DG ELBOW COMPLETE 3+V*R*
4 series · 4 of 4 positions shown · non-contrast
Comparison: None.

CLINICAL DATA: Fell off of stairs 4 days ago, injuring the right
elbow. Initial encounter.

EXAM:
RIGHT ELBOW - COMPLETE 3+ VIEW

[view not recorded (1 of 4)]
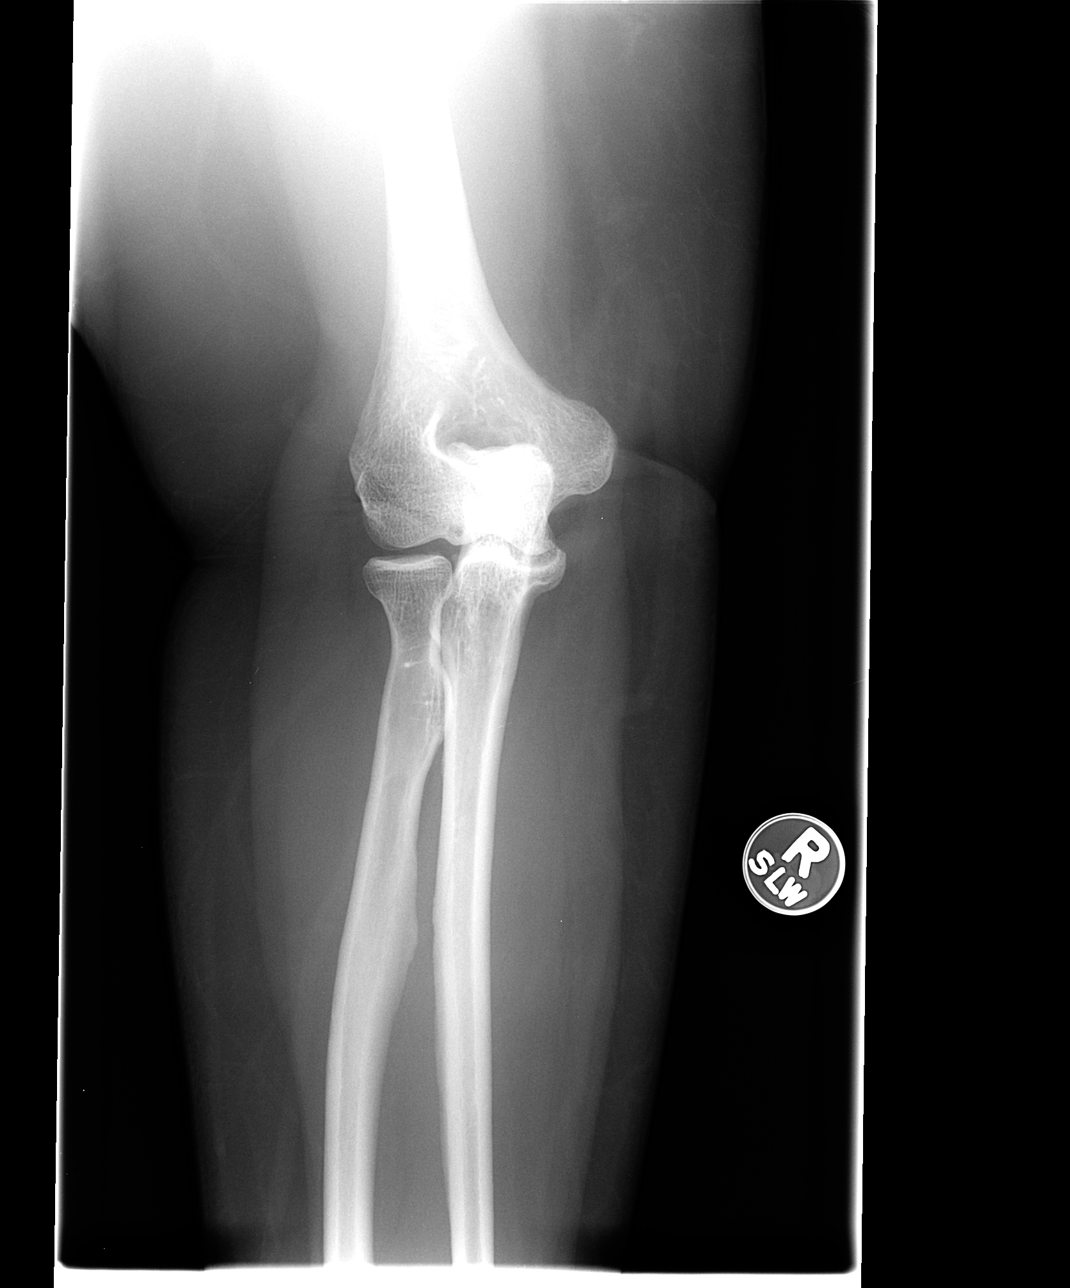

[view not recorded (2 of 4)]
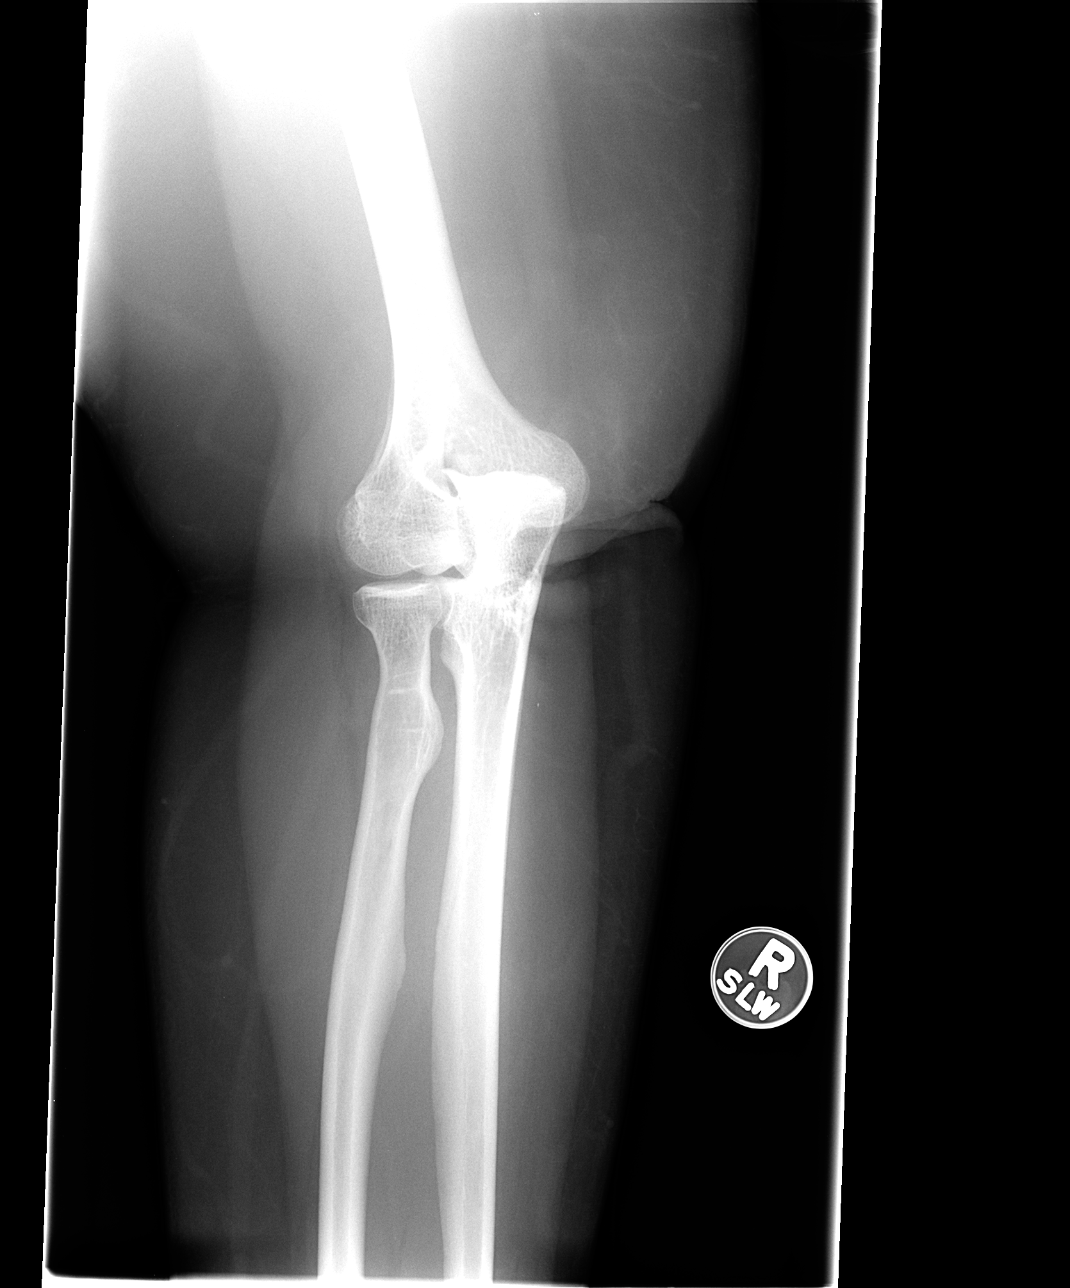

[view not recorded (3 of 4)]
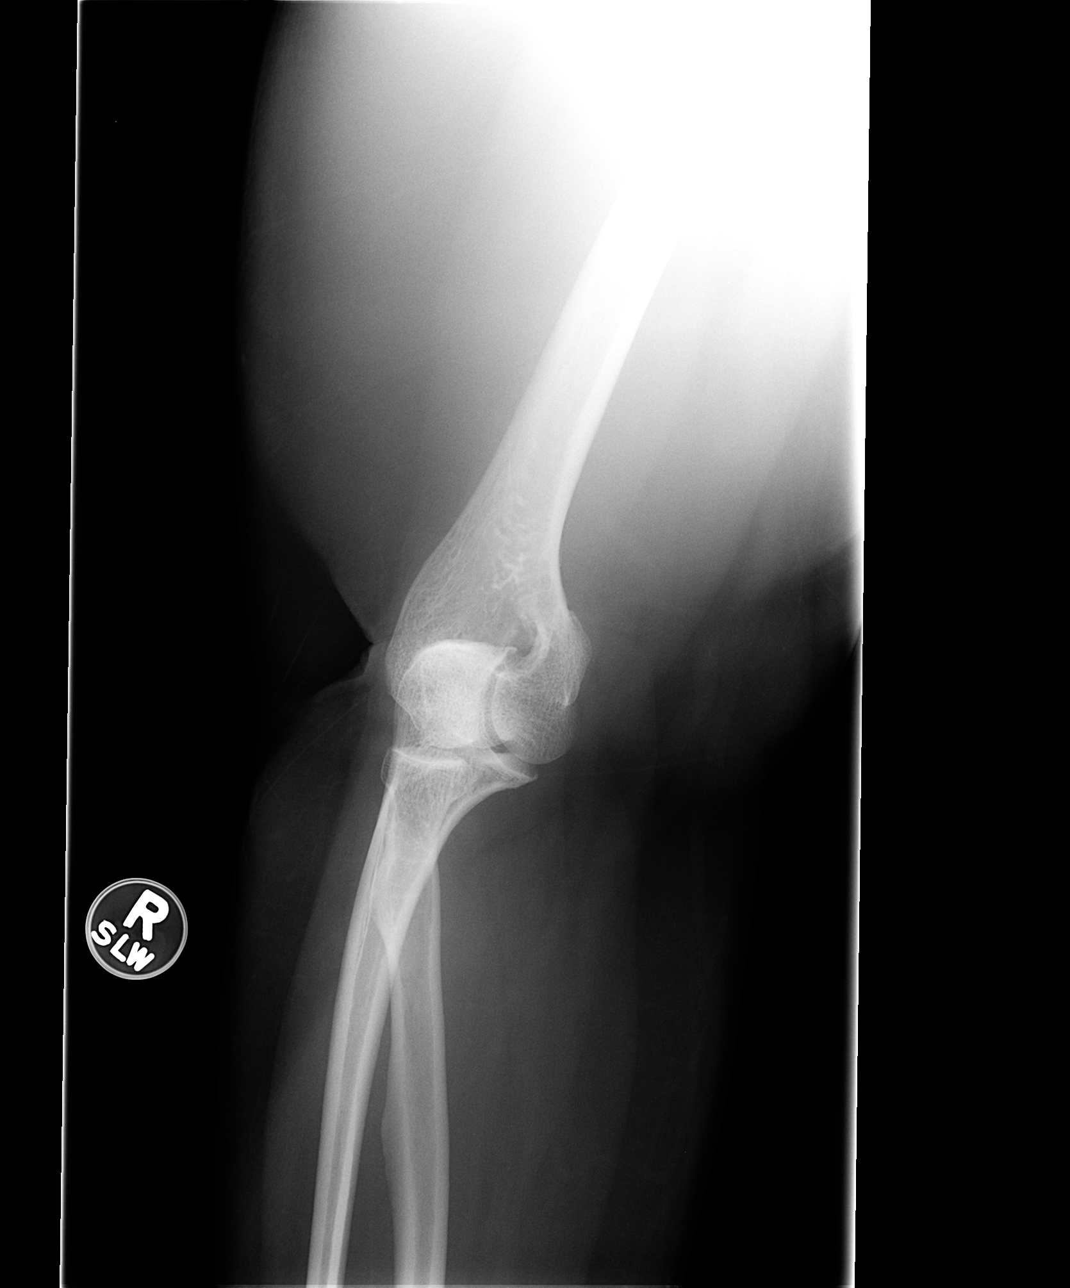

[view not recorded (4 of 4)]
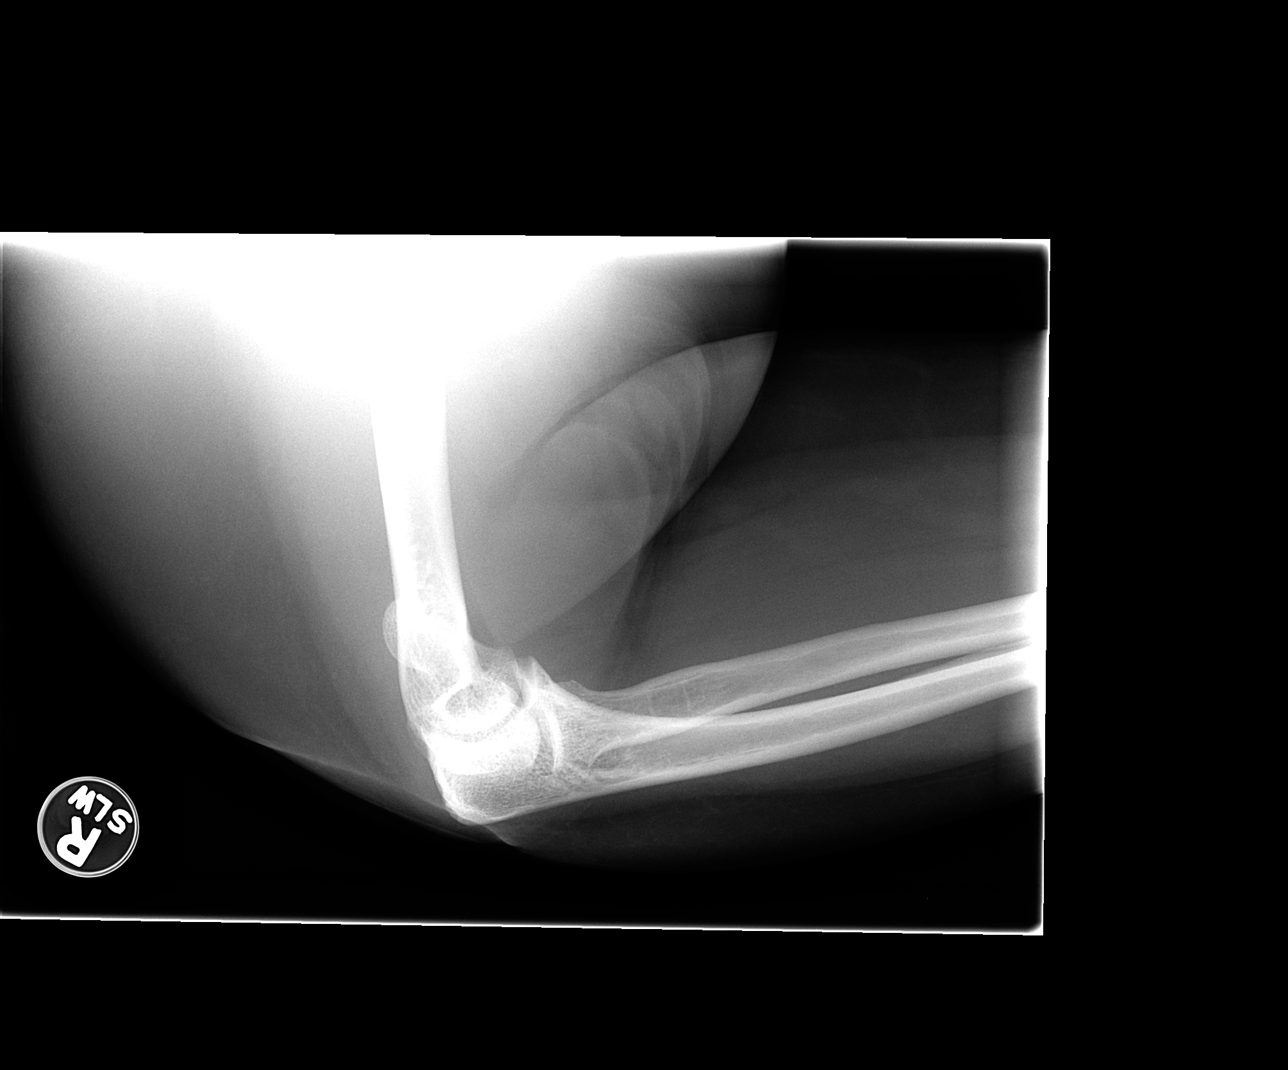

[4 of 4 positions shown; findings below may reference images not displayed]

FINDINGS: No evidence of acute fracture or dislocation. Joint spaces well
preserved with hypertrophic spurring involving the olecranon. Bone
mineral density well-preserved. No other intrinsic osseous
abnormality. No posterior fat pad sign.
IMPRESSION: No acute osseous abnormality. Minimal degenerative changes involving
the olecranon.

## 2015-04-24 ENCOUNTER — Telehealth: Payer: Self-pay | Admitting: *Deleted

## 2015-04-24 NOTE — Telephone Encounter (Signed)
Pt called and left a message that her elbow is still bothering her. Called pt and left a message on her vm that she needed to schedule a f/u appt with Dr. Georgina Snell. (see last progress note)

## 2015-04-25 NOTE — Progress Notes (Signed)
Quick Note:  Nothing is growing from the culture. ______

## 2015-04-28 ENCOUNTER — Encounter: Payer: Self-pay | Admitting: Family Medicine

## 2015-04-28 ENCOUNTER — Ambulatory Visit (INDEPENDENT_AMBULATORY_CARE_PROVIDER_SITE_OTHER): Payer: BLUE CROSS/BLUE SHIELD | Admitting: Family Medicine

## 2015-04-28 VITALS — BP 151/81 | HR 77 | Wt 328.0 lb

## 2015-04-28 DIAGNOSIS — M7021 Olecranon bursitis, right elbow: Secondary | ICD-10-CM

## 2015-04-28 MED ORDER — SULFAMETHOXAZOLE-TRIMETHOPRIM 800-160 MG PO TABS: 2.0000 | ORAL_TABLET | Freq: Two times a day (BID) | ORAL | Status: DC

## 2015-04-28 MED ORDER — FLUCONAZOLE 150 MG PO TABS: 150.0000 mg | ORAL_TABLET | Freq: Once | ORAL | Status: DC

## 2015-04-28 NOTE — Assessment & Plan Note (Signed)
Likely septic at this point. Trial of one more oral antibiotics. We'll use Bactrim. If no better will refer to orthopedic surgery for drainage.

## 2015-04-28 NOTE — Progress Notes (Signed)
Valerie Daniel is a 49 y.o. female who presents to [facility]  today for follow-up right elbow infection. Patient has been seen several times now for superficial cellulitis with coexisting olecranon bursitis of the right ankle. This occurred following an injury. She's had a trial of oral doxycycline and Ceftin antibiotics which worked well. Additionally she had aspiration of persistent bursitis week ago on antibiotics. Cultures so far been negative. She is she feeling better but notes continued redness and tenderness in the extensor elbow. She denies any fevers chills nausea vomiting or diarrhea and feels well otherwise.   Past Medical History  Diagnosis Date  . History of gallstones 01/20/2013   Past Surgical History  Procedure Laterality Date  . Cholecystectomy     Social History  Substance Use Topics  . Smoking status: Never Smoker   . Smokeless tobacco: Not on file  . Alcohol Use: 0.5 oz/week    1 drink(s) per week   family history includes Thyroid disease in her mother and sister.  ROS as above Medications: Current Outpatient Prescriptions  Medication Sig Dispense Refill  . cetirizine (ZYRTEC) 10 MG tablet Take 1 tablet (10 mg total) by mouth daily. 30 tablet 6  . diphenoxylate-atropine (LOMOTIL) 2.5-0.025 MG per tablet Take 1 tablet by mouth 4 (four) times daily as needed for diarrhea or loose stools. Use only as needed. 60 tablet 0  . fluconazole (DIFLUCAN) 150 MG tablet Take 1 tablet (150 mg total) by mouth once. 1 tablet 1  . hyoscyamine (LEVSIN, ANASPAZ) 0.125 MG tablet Take 1 tablet (0.125 mg total) by mouth every 4 (four) hours as needed for cramping. 30 tablet 0  . mometasone (NASONEX) 50 MCG/ACT nasal spray Place 2 sprays into the nose daily. 17 g 6  . NEXIUM 40 MG capsule TAKE 1 CAPSULE (40 MG TOTAL) BY MOUTH DAILY. 30 capsule 0  . sulfamethoxazole-trimethoprim (BACTRIM DS,SEPTRA DS) 800-160 MG tablet Take 2 tablets by mouth 2 (two)  times daily. 28 tablet 0   No current facility-administered medications for this visit.   Allergies  Allergen Reactions  . Asa [Aspirin]   . Codeine      Exam:  BP 151/81 mmHg  Pulse 77  Wt 328 lb (148.78 kg) Gen: Well NAD Right elbow the skin surrounding the olecranon bursa is well-appearing. Superficial the skin is erythematous and tender with a mild fluctuance. Normal elbow motion.  No results found for this or any previous visit (from the past 24 hour(s)). No results found.   Please see individual assessment and plan sections.

## 2015-04-28 NOTE — Patient Instructions (Signed)
Thank you for coming in today. Take bactirm antibiotic twice daily for one week. Return in one week. Do not become pregnant with taking this medication. Take fluconazole if you develop a yeast infection. Call if you get worse

## 2015-05-15 ENCOUNTER — Other Ambulatory Visit: Payer: Self-pay | Admitting: Family Medicine

## 2015-05-15 DIAGNOSIS — Z1231 Encounter for screening mammogram for malignant neoplasm of breast: Secondary | ICD-10-CM

## 2015-05-17 ENCOUNTER — Ambulatory Visit: Payer: BLUE CROSS/BLUE SHIELD

## 2015-05-17 ENCOUNTER — Ambulatory Visit (INDEPENDENT_AMBULATORY_CARE_PROVIDER_SITE_OTHER): Payer: BLUE CROSS/BLUE SHIELD | Admitting: Family Medicine

## 2015-05-17 ENCOUNTER — Encounter: Payer: Self-pay | Admitting: Family Medicine

## 2015-05-17 VITALS — BP 127/73 | HR 79 | Temp 97.9°F | Resp 18 | Wt 329.0 lb

## 2015-05-17 DIAGNOSIS — M7021 Olecranon bursitis, right elbow: Secondary | ICD-10-CM

## 2015-05-17 MED ORDER — FLUCONAZOLE 150 MG PO TABS: 150.0000 mg | ORAL_TABLET | Freq: Once | ORAL | Status: DC

## 2015-05-17 MED ORDER — SULFAMETHOXAZOLE-TRIMETHOPRIM 800-160 MG PO TABS: 2.0000 | ORAL_TABLET | Freq: Two times a day (BID) | ORAL | Status: DC

## 2015-05-17 NOTE — Progress Notes (Signed)
Valerie Daniel is a 49 y.o. female who presents to [facility]: [facility]  today for follow-up olecranon bursitis. Patient has been seen several times for likely septic olecranon bursitis. She was last seen about 3 weeks ago and received a course of Bactrim. She had significant improvement in the interim but notes that she's not 100% better. The tip of her elbow is still red and tender some. She denies any fevers chills nausea vomiting or diarrhea.   Past Medical History  Diagnosis Date  . History of gallstones 01/20/2013   Past Surgical History  Procedure Laterality Date  . Cholecystectomy     Social History  Substance Use Topics  . Smoking status: Never Smoker   . Smokeless tobacco: Not on file  . Alcohol Use: 0.5 oz/week    1 drink(s) per week   family history includes Thyroid disease in her mother and sister.  ROS as above Medications: Current Outpatient Prescriptions  Medication Sig Dispense Refill  . cetirizine (ZYRTEC) 10 MG tablet Take 1 tablet (10 mg total) by mouth daily. 30 tablet 6  . fluconazole (DIFLUCAN) 150 MG tablet Take 1 tablet (150 mg total) by mouth once. 1 tablet 1  . mometasone (NASONEX) 50 MCG/ACT nasal spray Place 2 sprays into the nose daily. 17 g 6  . NEXIUM 40 MG capsule TAKE 1 CAPSULE (40 MG TOTAL) BY MOUTH DAILY. 30 capsule 0  . sulfamethoxazole-trimethoprim (BACTRIM DS,SEPTRA DS) 800-160 MG tablet Take 2 tablets by mouth 2 (two) times daily. 28 tablet 0   No current facility-administered medications for this visit.   Allergies  Allergen Reactions  . Asa [Aspirin]   . Codeine      Exam:  BP 127/73 mmHg  Pulse 79  Temp(Src) 97.9 F (36.6 C) (Oral)  Resp 18  Wt 329 lb (149.233 kg)  SpO2 99% Gen: Well NAD  Skin: Right olecranon tender and mildly indurated. No fluctuance palpated. Normal elbow motion pulses Refill sensation intact distally.   Limited musculoskeletal ultrasound right olecranon: Olecranon visualized.  There is a slight hypoechoic fluid highlighting a isoechoic small structure. Bones visualized and normal.    No results found for this or any previous visit (from the past 24 hour(s)). No results found.   Please see individual assessment and plan sections.

## 2015-05-17 NOTE — Assessment & Plan Note (Addendum)
Resolving olecranon bursitis. Patient is 90% better. We'll try one more trial of antibiotics. If not better at that time would send for orthopedic surgery consultation for consideration of excision of olecranon bursa. Also will prescribe fluconazole as patient gets yeast infections with antibiotics

## 2015-05-17 NOTE — Patient Instructions (Signed)
Thank you for coming in today. Recheck in 2 weeks.  If not better we will go for surgery.  Return sooner if worsening.

## 2015-06-30 ENCOUNTER — Ambulatory Visit (INDEPENDENT_AMBULATORY_CARE_PROVIDER_SITE_OTHER): Payer: BLUE CROSS/BLUE SHIELD | Admitting: Family Medicine

## 2015-06-30 ENCOUNTER — Encounter: Payer: Self-pay | Admitting: Family Medicine

## 2015-06-30 VITALS — BP 144/85 | HR 74 | Wt 327.0 lb

## 2015-06-30 DIAGNOSIS — M7021 Olecranon bursitis, right elbow: Secondary | ICD-10-CM

## 2015-06-30 NOTE — Progress Notes (Signed)
       Valerie Daniel is a 49 y.o. female who presents to [facility]: [facility] today for follow up olecranon bursitis of the right elbow. Patient was originally seen on Sep 29th for presumed cellulitis/septic olecranon bursitis. She had aspiration and several rounds of oral antibiotics. Today she is pleased to announce that she is completely better and asymptomatic. She denies any fevers chills nausea vomiting diarrhea or elbow pain.  She is currently working on losing weight and is planning a trip to [place] and possibly [place] in the future.   Past Medical History  Diagnosis Date  . History of gallstones 01/20/2013   Past Surgical History  Procedure Laterality Date  . Cholecystectomy     Social History  Substance Use Topics  . Smoking status: Never Smoker   . Smokeless tobacco: Not on file  . Alcohol Use: 0.5 oz/week    1 drink(s) per week   family history includes Thyroid disease in her mother and sister.  ROS as above Medications: Current Outpatient Prescriptions  Medication Sig Dispense Refill  . cetirizine (ZYRTEC) 10 MG tablet Take 1 tablet (10 mg total) by mouth daily. 30 tablet 6  . mometasone (NASONEX) 50 MCG/ACT nasal spray Place 2 sprays into the nose daily. 17 g 6  . NEXIUM 40 MG capsule TAKE 1 CAPSULE (40 MG TOTAL) BY MOUTH DAILY. 30 capsule 0   No current facility-administered medications for this visit.   Allergies  Allergen Reactions  . Asa [Aspirin]   . Codeine      Exam:  BP 144/85 mmHg  Pulse 74  Wt 327 lb (148.326 kg) Gen: Well NAD Right elbow completely normal and nontender. Normal motion.  No results found for this or any previous visit (from the past 24 hour(s)). No results found.   Assessment and plan: 49 year old woman with history of olecranon bursitis. Olecranon bursitis is resolved. Return as needed.

## 2015-06-30 NOTE — Assessment & Plan Note (Signed)
Resolved. Return as needed. 

## 2015-10-20 ENCOUNTER — Telehealth: Payer: Self-pay

## 2015-10-20 NOTE — Telephone Encounter (Signed)
Pt wants to know will you Rx HCT Inositol 500 iu's for her.  She stated that she have been taking this med for the past 8 months and have maintain her weight loss. Please advise.

## 2015-10-23 NOTE — Telephone Encounter (Signed)
Pt.notified

## 2015-10-23 NOTE — Telephone Encounter (Signed)
I'm not familiar with this medication

## 2015-10-25 ENCOUNTER — Telehealth: Payer: Self-pay

## 2015-10-25 ENCOUNTER — Ambulatory Visit (INDEPENDENT_AMBULATORY_CARE_PROVIDER_SITE_OTHER): Payer: BLUE CROSS/BLUE SHIELD | Admitting: Family Medicine

## 2015-10-25 ENCOUNTER — Encounter: Payer: Self-pay | Admitting: Family Medicine

## 2015-10-25 VITALS — BP 114/71 | HR 80 | Wt 290.0 lb

## 2015-10-25 DIAGNOSIS — E669 Obesity, unspecified: Secondary | ICD-10-CM

## 2015-10-25 DIAGNOSIS — G43809 Other migraine, not intractable, without status migrainosus: Secondary | ICD-10-CM

## 2015-10-25 DIAGNOSIS — H1013 Acute atopic conjunctivitis, bilateral: Secondary | ICD-10-CM

## 2015-10-25 MED ORDER — AMBULATORY NON FORMULARY MEDICATION: Status: DC

## 2015-10-25 MED ORDER — AZELASTINE HCL 0.05 % OP SOLN: 1.0000 [drp] | Freq: Two times a day (BID) | OPHTHALMIC | Status: AC

## 2015-10-25 MED ORDER — SUMATRIPTAN SUCCINATE 100 MG PO TABS: 100.0000 mg | ORAL_TABLET | ORAL | Status: DC | PRN

## 2015-10-25 NOTE — Telephone Encounter (Signed)
Rx faxed

## 2015-10-25 NOTE — Telephone Encounter (Signed)
Valerie Daniel, Rx placed in in-box ready for pickup/faxing.  

## 2015-10-25 NOTE — Telephone Encounter (Signed)
[facility] [phone]

## 2015-10-25 NOTE — Telephone Encounter (Signed)
Will you please call [facility] in [place] and ask them if they carry this product at a dose of 500 IUs

## 2015-10-25 NOTE — Telephone Encounter (Signed)
This is the information Anajah left for you HCG/methylcobalamin/inositol #381 500 iu.

## 2015-10-25 NOTE — Telephone Encounter (Signed)
[facility] do have that med in 500 iu

## 2015-10-25 NOTE — Progress Notes (Signed)
CC: Valerie Daniel is a 50 y.o. female is here for Weight Loss and Sinusitis   Subjective: HPI:  Bilateral itchy eyes with occasional watering that's present only after time that she spends outside. She notices that this happens when pollen counts are high. No benefit from a anti-inflammatory medication provided by her ophthalmologist. She denies any vision loss. No eye pain or redness.  She like to have something that she can take for migraines to help block migraines. She is not interested in preventative medicine but would like something to take on an as-needed basis instead. She denies any change in the severity or character or frequency of her migraines. They're still unilateral occasionally with a phobia and nausea.. Usually ibuprofen works  She would like a refill on the medication that I never prescribed and never heard of to help with her obesity. It sounds like it's a combination of beta hCG and 2 other ingredients. She originally got is from a Journalist, newspaper in [place] but knows of a pharmacy in [place] that we'll provide her with this medication if I have a prescription written out for her. She's been able to lose 30 pounds slowly taking this medication on daily basis while trying to watch the amount of fat she's ingesting in her diet. She denies any known side effects since starting on this medication many months ago    Review Of Systems Outlined In HPI  Past Medical History  Diagnosis Date  . History of gallstones 01/20/2013    Past Surgical History  Procedure Laterality Date  . Cholecystectomy     Family History  Problem Relation Age of Onset  . Thyroid disease Mother   . Thyroid disease Sister     Social History   Social History  . Marital Status: Single    Spouse Name: N/A  . Number of Children: N/A  . Years of Education: N/A   Occupational History  . Not on file.   Social History Main Topics  . Smoking status: Never Smoker   . Smokeless tobacco: Not  on file  . Alcohol Use: 0.5 oz/week    1 drink(s) per week  . Drug Use: Yes  . Sexual Activity: Not on file   Other Topics Concern  . Not on file   Social History Narrative     Objective: BP 114/71 mmHg  Pulse 80  Wt 290 lb (131.543 kg)  Vital signs reviewed. General: Alert and Oriented, No Acute Distress HEENT: Pupils equal, round, reactive to light. Conjunctivae clear.  External ears unremarkable.  Moist mucous membranes. Lungs: Clear and comfortable work of breathing, speaking in full sentences without accessory muscle use. Cardiac: Regular rate and rhythm.  Neuro: CN II-XII grossly intact, gait normal. Extremities: No peripheral edema.  Strong peripheral pulses.  Mental Status: No depression, anxiety, nor agitation. Logical though process. Skin: Warm and dry.  Assessment & Plan: Valerie Daniel was seen today for weight loss and sinusitis.  Diagnoses and all orders for this visit:  Allergic conjunctivitis, bilateral -     azelastine (OPTIVAR) 0.05 % ophthalmic solution; Place 1 drop into both eyes 2 (two) times daily. To prevent eye allergies.  Other migraine without status migrainosus, not intractable -     SUMAtriptan (IMITREX) 100 MG tablet; Take 1 tablet (100 mg total) by mouth every 2 (two) hours as needed for migraine. May repeat in 2 hours if headache persists or recurs.  Obesity   allergic arthritis: Start Optivar and if not working after 2-3 days  follow-up with eye specialist   migraines: Currently controlled but does not have sufficient abortive therapy therefore start as needed Imitrex Obesity: Discussed with her that I don't know of any clinical trials that have proven benefit from HCG/methylcobalamin/inositol but I also don't know of any major risk that these 3 components placed on her health. She seems to be doing great on this medication without any side effects that seems reasonable to provide her with a prescription, she develops new unexplainable  symptoms.  25 minutes spent face-to-face during visit today of which at least 50% was counseling or coordinating care regarding: 1. Allergic conjunctivitis, bilateral   2. Other migraine without status migrainosus, not intractable   3. Obesity       Return if symptoms worsen or fail to improve.

## 2015-10-31 ENCOUNTER — Telehealth: Payer: Self-pay

## 2015-10-31 MED ORDER — AMBULATORY NON FORMULARY MEDICATION: Status: DC

## 2015-10-31 NOTE — Telephone Encounter (Signed)
[place], Rx placed in in-box ready for pickup/faxing ([facility])

## 2015-10-31 NOTE — Telephone Encounter (Signed)
[place], Rx placed in in-box ready for pickup/faxing.  

## 2015-10-31 NOTE — Telephone Encounter (Signed)
The pharmacy called and states the HCG will be destroyed in the stomach acids so the prescription needs to be written for troche form. Please advise.   [place], pharmacist

## 2015-10-31 NOTE — Telephone Encounter (Signed)
Rx faxed

## 2015-11-20 ENCOUNTER — Encounter: Admit: 2015-11-20 | Primary: Family Medicine

## 2015-11-20 ENCOUNTER — Inpatient Hospital Stay: Admit: 2015-11-20 | Discharge: 2015-11-20 | Disposition: A | Discharge status: Home / Self Care

## 2015-11-20 DIAGNOSIS — J40 Bronchitis, not specified as acute or chronic: Secondary | ICD-10-CM

## 2015-11-20 MED ORDER — PREDNISONE 10 MG PO TABS
10 MG | ORAL_TABLET | ORAL | 0 refills | Status: AC
Start: 2015-11-20 — End: 2015-11-30

## 2015-11-20 MED ORDER — DOXYCYCLINE HYCLATE 100 MG PO TABS
100 MG | ORAL_TABLET | Freq: Two times a day (BID) | ORAL | 0 refills | Status: AC
Start: 2015-11-20 — End: 2015-11-30

## 2015-11-20 MED ORDER — IPRATROPIUM-ALBUTEROL 0.5-2.5 (3) MG/3ML IN SOLN
Freq: Once | RESPIRATORY_TRACT | Status: AC
Start: 2015-11-20 — End: 2015-11-20
  Administered 2015-11-20: 16:00:00 1 via RESPIRATORY_TRACT

## 2015-11-20 NOTE — ED Notes (Signed)
Discharge instructions including follow up, medications, and when to return to the ED discussed with patient. She verbalized understanding.      Hazel Samsawn Kenyon Eshleman, RN  11/20/15 [phone]

## 2015-11-20 NOTE — ED Notes (Signed)
Returned from radiology. Complains of a cough x 15 days. Was seen by her PCP and told to take Claritin initially because she believed it was allergy related. However, symptoms did not improve, so she returned to her PCP. Was prescribed a 5 day course of prednisone, which she finished about 5 days ago without any change. Cough is occasional during the day, but much more persistent at night. Initially had clear phlegm, but now it is white and has a bad taste to it. Denies SOB, CP, fever, hx of asthma or COPD. Is in no obvious distress, but does state that the cough keeps her from sleeping. Has various family here with her. Will monitor.      Maria Samsawn Leeroy Lovings, RN  11/20/15 [phone]

## 2015-11-20 NOTE — ED Notes (Signed)
Respiratory notified of treatment need     Almyra FreeDiane Maizel, LPN  16/04/9604/08/17 [id number]

## 2015-11-20 NOTE — ED Provider Notes (Signed)
[facility]      HPI:  11/20/15,   Time: 11:20 AM         Maria Valdez is a 50 y.o. female presenting to the ED for cough, beginning weeks ago.  The complaint has been persistent, moderate in severity, and worsened by laying down/night.  .  Patient presents to the emergency room with a cough she's had now for 3 weeks. She states that she's had a lot of postnasal drip and congestion as well. She's been seen by her family physician on 2 separate occasions. The 1st time she was given something for allergies and a decongestant. She states it really didn't help at all when she went back she was given a few days of steroids. Again the patient states she feels no better. She states that the drainage is changing color. The cough is worse at night and when she lays down. She is not having any shortness of breath or fever. The patient is a smoker. She has diabetes as well but reports her sugars have been under good control    ROS:     Constitutional: Negative for fever and chills  HENT: See HPI  Eyes: Negative for pain, discharge and redness  Respiratory: See HPI  Cardiovascular: Negative for CP, edema or palpitations  Gastrointestinal: Negative for nausea, vomiting, diarrhea and abdominal distention  Genitourinary: Negative for dysuria and frequency  Musculoskeletal: Negative for back pain and arthralgia  Skin: Negative for rash and wound  Neurological: Negative for weakness and headaches  Hematological: Negative for adenopathy    All other systems reviewed and are negative      --------------------------------------------- PAST HISTORY ---------------------------------------------  Past Medical History:  has a past medical history of HIV infection (HCC); HTN (hypertension); Tobacco use disorder affecting pregnancy, antepartum; and Tobacco use disorder affecting pregnancy, antepartum.    Past Surgical History:  has a past surgical history that includes Thyroid surgery and Cholecystectomy.    Social History:  reports  that she has been smoking Cigarettes.  She has been smoking about 0.50 packs per day. She has never used smokeless tobacco. She reports that she does not drink alcohol or use illicit drugs.    Family History: family history is not on file.     The patient???s home medications have been reviewed.    Allergies: Sulfa antibiotics and Codeine    -------------------------------------------------- RESULTS -------------------------------------------------  All laboratory and radiology results have been personally reviewed by myself   LABS:  No results found for this visit on 11/20/15.    RADIOLOGY:  Interpreted by Radiologist.  XR Chest Standard TWO VW   Final Result   No evidence of acute pulmonary disease.             ------------------------- NURSING NOTES AND VITALS REVIEWED ---------------------------   The nursing notes within the ED encounter and vital signs as below have been reviewed.   BP 124/74   Pulse 82   Temp 98.4 ??F (36.9 ??C) (Oral)    Resp 16   Ht 5\' 2"  (1.575 m)   Wt 120 lb (54.4 kg)   LMP 10/25/2015   SpO2 97%   BMI 21.95 kg/m2  Oxygen Saturation Interpretation: Normal      ---------------------------------------------------PHYSICAL EXAM--------------------------------------      Constitutional/General: Alert and oriented x3, well appearing, non toxic in NAD  Head: NC/AT  Eyes: PERRL, EOMI  TM's intact and clear.  Posterior pharynx unremarkable.  No swelling or exudates.    Mouth: Oropharynx clear,  handling secretions, no trismus  Neck: Supple, full ROM, no meningeal signs  Pulmonary: Lungs decreased but clear to auscultation bilaterally, no wheezes, rales, or rhonchi. Not in respiratory distress  Cardiovascular:  Regular rate and rhythm, no murmurs, gallops, or rubs. 2+ distal pulses  Extremities: Moves all extremities x 4. Warm and well perfused  Skin: warm and dry without rash  Neurologic: GCS 15,  Intact.  No focal deficits  Psych: Normal Affect      ------------------------------ ED COURSE/MEDICAL  DECISION MAKING----------------------  Medications   ipratropium-albuterol (DUONEB) nebulizer solution 1 ampule (1 ampule Inhalation Given 11/20/15 1150)         Medical Decision Making:    Patient's chest x-ray is clear. I did not hear a lot of bronchospasm on examination certainly suspect that's what is going on here. She is a diabetic on making steroids difficult but tells me that her sugars are in the 110-130 range. She had steroids a few weeks ago and did well. I'm getting give her some doxycycline and a 5 day course of prednisone. Certainly this could still be viral and bronchitis as can take weeks to improve. She needs to watch her sugars closely. Follow up with her PCP in a few days or return here as needed    Counseling:   The emergency provider has spoken with the patient and discussed today???s results, in addition to providing specific details for the plan of care and counseling regarding the diagnosis and prognosis.  Questions are answered at this time and they are agreeable with the plan.      --------------------------------- IMPRESSION AND DISPOSITION ---------------------------------    IMPRESSION  1. Bronchitis    2. Tobacco abuse disorder        DISPOSITION  Disposition: Discharge to home  Patient condition is stable                   Eartha Inch, PA-C  11/20/15 1200

## 2015-12-30 ENCOUNTER — Other Ambulatory Visit: Payer: Self-pay | Admitting: Family Medicine

## 2016-01-15 ENCOUNTER — Other Ambulatory Visit: Payer: Self-pay | Admitting: Family Medicine

## 2016-01-31 ENCOUNTER — Ambulatory Visit (INDEPENDENT_AMBULATORY_CARE_PROVIDER_SITE_OTHER): Payer: BLUE CROSS/BLUE SHIELD

## 2016-01-31 DIAGNOSIS — Z1231 Encounter for screening mammogram for malignant neoplasm of breast: Secondary | ICD-10-CM | POA: Diagnosis not present

## 2016-03-09 ENCOUNTER — Other Ambulatory Visit: Payer: Self-pay | Admitting: Family Medicine

## 2016-04-02 ENCOUNTER — Other Ambulatory Visit: Payer: Self-pay | Admitting: Family Medicine

## 2016-05-10 ENCOUNTER — Other Ambulatory Visit: Payer: Self-pay | Admitting: Family Medicine

## 2016-05-10 ENCOUNTER — Other Ambulatory Visit: Payer: Self-pay | Admitting: Physician Assistant

## 2016-05-26 ENCOUNTER — Encounter: Admit: 2016-05-26 | Primary: Family Medicine

## 2016-05-26 ENCOUNTER — Inpatient Hospital Stay: Admit: 2016-05-26 | Discharge: 2016-05-26 | Disposition: A | Discharge status: Home / Self Care

## 2016-05-26 DIAGNOSIS — J209 Acute bronchitis, unspecified: Secondary | ICD-10-CM

## 2016-05-26 MED ORDER — BENZONATATE 100 MG PO CAPS
100 MG | ORAL_CAPSULE | Freq: Three times a day (TID) | ORAL | 0 refills | Status: AC | PRN
Start: 2016-05-26 — End: 2016-06-02

## 2016-05-26 MED ORDER — PREDNISONE 10 MG PO TABS
10 MG | ORAL_TABLET | Freq: Every day | ORAL | 0 refills | Status: AC
Start: 2016-05-26 — End: 2016-05-31

## 2016-05-26 MED ORDER — IPRATROPIUM-ALBUTEROL 0.5-2.5 (3) MG/3ML IN SOLN
Freq: Once | RESPIRATORY_TRACT | Status: AC
Start: 2016-05-26 — End: 2016-05-26
  Administered 2016-05-26: 20:00:00 1 via RESPIRATORY_TRACT

## 2016-05-26 MED ORDER — DOXYCYCLINE HYCLATE 100 MG PO TABS
100 MG | ORAL_TABLET | Freq: Two times a day (BID) | ORAL | 0 refills | Status: AC
Start: 2016-05-26 — End: 2016-06-05

## 2016-05-26 NOTE — ED Provider Notes (Signed)
[facility]    ??  Department of Emergency Medicine   ED  Provider Note  Admit Date/RoomTime: 05/26/2016  2:34 PM  ED Room: 34/34  Chief Complaint:   Cough (worse with lying down productive clear cough x2-3 weeks)    History of Present Illness   Source of history provided by:  patient.  History/Exam Limitations: none.      Maria Valdez is a 50 y.o. old female with a past medical history of:   Past Medical History:   Diagnosis Date   ??? Diabetes mellitus (HCC)    ??? HIV infection (HCC)    ??? HTN (hypertension) 08/19/2011   ??? Thyroid disease    ??? Tobacco use disorder affecting pregnancy, antepartum    ??? Tobacco use disorder affecting pregnancy, antepartum     presents to the emergency department by private vehicle, for nasal congestion, rhinorrhea and cough, which began 2-3 week(s) prior to arrival.  Since onset the symptoms have been persistent and moderate in severity. The symptoms are associated with no other concerns.  There has been NO fevers, chills, Chest pain, shortness breath, abdominal pain, vomiting, diarrhea, rashes, swelling in her lower extremities. The patient states that she her CD4 count is greater than 400 and her viral load is undetectable. She is compliant with all medications. She follows with Dr. Daryel NovemberLimbu every 3 months.    ROS   Pertinent positives and negatives are stated within HPI, all other systems reviewed and are negative.    Past Surgical History:  has a past surgical history that includes Thyroid surgery and Cholecystectomy.  Social History:  reports that she has been smoking Cigarettes.  She has been smoking about 0.50 packs per day. She has never used smokeless tobacco. She reports that she does not drink alcohol or use drugs.  Family History: family history is not on file.   Allergies: Sulfa antibiotics and Codeine    Physical Exam           ED Triage Vitals   BP Temp Temp Source Pulse Resp SpO2 Height Weight   05/26/16 1440 05/26/16 1437 05/26/16 1437 05/26/16 1437 05/26/16 1437  05/26/16 1437 05/26/16 1435 05/26/16 1435   134/74 97.8 ??F (36.6 ??C) Oral 79 18 97 % 5\' 2"  (1.575 m) 134 lb (60.8 kg)      Oxygen Saturation Interpretation: Normal.    ?? Constitutional:  Alert, development consistent with age.  ?? Nose:   There is mucosal erythema.  ?? Mouth:  normal tongue and buccal mucosa.   ?? Throat: no erythema or exudates noted. Teeth and gums normal. and + PND.  Airway Patent.  ?? Neck:  Supple. There is no  anterior cervical node tenderness.  ?? Respiratory:   Breath sounds: Bilateral normal.  Lung sounds: wheezing- few.   ?? CV:  Regular rate and rhythm, normal heart sounds, without pathological murmurs, ectopy, gallops, or rubs.  ?? Integument:  Normal turgor.  Warm, dry, without visible rash.  ?? Neurological:  Oriented.  Motor functions intact.       Lab / Imaging Results   (All laboratory and radiology results have been personally reviewed by myself)  Labs:  No results found for this visit on 05/26/16.    Imaging:  All Radiology results interpreted by Radiologist unless otherwise noted.  XR CHEST STANDARD (2 VW)   Final Result   1. No acute pleuroparenchymal disease.  ED Course / Medical Decision Making     Medications   ipratropium-albuterol (DUONEB) nebulizer solution 1 ampule (1 ampule Inhalation Given 05/26/16 1511)     ED Course      Re-examination:  05/26/16       Time: 1552    Patient remains stable    Consults:   None    Procedures:   none    Medical Decision Making:    Based on moderate suspicion for pneumonia as per history/physical findings, imaging was done.  Upper respiratory infection is likely to  be viral in etiology . Antibiotics are indicated at this time based on clinical presentation and physical findings. She is not hypoxic.  Patient is well appearing, non toxic and appropriate for outpatient management. The patient was advised to monitor her blood glucose very closely while taking prednisone and that it will elevate her blood glucose. She voices  understanding.  Plan of Care: Normal progression of disease discussed.  All questions answered.  Explained the rationale for symptomatic treatment rather than use of an antibiotic.  Instruction provided in the use of fluids, vaporizer, acetaminophen, and other OTC medication for symptom control.  Extra fluids  Analgesics as needed, dose reviewed.  Follow up as needed should symptoms fail to improve.     Counseling:    The emergency provider has spoken with the patient and discussed today???s results, in addition to providing specific details for the plan of care and counseling regarding the diagnosis and prognosis.  Questions are answered at this time and they are agreeable with the plan.    Assessment     1. Acute bronchitis, unspecified organism      Plan   Discharge to home  Patient condition is stable    New Medications     New Prescriptions    BENZONATATE (TESSALON PERLES) 100 MG CAPSULE    Take 1 capsule by mouth 3 times daily as needed for Cough    DOXYCYCLINE HYCLATE (VIBRA-TABS) 100 MG TABLET    Take 1 tablet by mouth 2 times daily for 10 days    PREDNISONE (DELTASONE) 10 MG TABLET    Take 4 tablets by mouth daily for 5 days     Electronically signed by Casimer LeekMelissa M Pal Shell, PA   DD: 05/26/16  **This report was transcribed using voice recognition software. Every effort was made to ensure accuracy; however, inadvertent computerized transcription errors may be present.  END OF ED PROVIDER NOTE         Casimer LeekMelissa M Kasen Adduci, PA  05/26/16 1552       Casimer LeekMelissa M Detria Cummings, PA  05/26/16 [phone]

## 2016-05-26 NOTE — ED Notes (Signed)
Respiratory notified of treatment need     Almyra FreeDiane Maizel, LPN  09/81/1908/06/30 [id number]

## 2016-06-03 ENCOUNTER — Other Ambulatory Visit: Payer: Self-pay | Admitting: Physician Assistant

## 2016-06-10 ENCOUNTER — Other Ambulatory Visit: Payer: Self-pay | Admitting: Physician Assistant

## 2016-07-02 ENCOUNTER — Other Ambulatory Visit: Payer: Self-pay | Admitting: Physician Assistant

## 2016-07-03 ENCOUNTER — Emergency Department: Admit: 2016-07-03 | Primary: Family Medicine

## 2016-07-03 ENCOUNTER — Inpatient Hospital Stay
Admit: 2016-07-03 | Discharge: 2016-07-04 | Disposition: A | Discharge status: Home / Self Care | Attending: Emergency Medicine

## 2016-07-03 DIAGNOSIS — R197 Diarrhea, unspecified: Secondary | ICD-10-CM

## 2016-07-03 LAB — CBC WITH AUTO DIFFERENTIAL
Basophils %: 0.2 % (ref 0.0–2.0)
Basophils Absolute: 0.04 E9/L (ref 0.00–0.20)
Eosinophils %: 0.6 % (ref 0.0–6.0)
Eosinophils Absolute: 0.12 E9/L (ref 0.05–0.50)
Hematocrit: 33.6 % — ABNORMAL LOW (ref 34.0–48.0)
Hemoglobin: 10.6 g/dL — ABNORMAL LOW (ref 11.5–15.5)
Immature Granulocytes #: 0.11 E9/L
Immature Granulocytes %: 0.6 % (ref 0.0–5.0)
Lymphocytes %: 2 % — ABNORMAL LOW (ref 20.0–42.0)
Lymphocytes Absolute: 0.38 E9/L — ABNORMAL LOW (ref 1.50–4.00)
MCH: 27.5 pg (ref 26.0–35.0)
MCHC: 31.5 % — ABNORMAL LOW (ref 32.0–34.5)
MCV: 87 fL (ref 80.0–99.9)
MPV: 10.1 fL (ref 7.0–12.0)
Monocytes %: 5.6 % (ref 2.0–12.0)
Monocytes Absolute: 1.06 E9/L — ABNORMAL HIGH (ref 0.10–0.95)
Neutrophils %: 91 % — ABNORMAL HIGH (ref 43.0–80.0)
Neutrophils Absolute: 17.29 E9/L — ABNORMAL HIGH (ref 1.80–7.30)
Platelets: 335 E9/L (ref 130–450)
RBC: 3.86 E12/L (ref 3.50–5.50)
RDW: 13.3 fL (ref 11.5–15.0)
WBC: 19 E9/L — ABNORMAL HIGH (ref 4.5–11.5)

## 2016-07-03 LAB — COMPREHENSIVE METABOLIC PANEL
ALT: 6 U/L (ref 0–32)
AST: 33 U/L — ABNORMAL HIGH (ref 0–31)
Albumin: 2.6 g/dL — ABNORMAL LOW (ref 3.5–5.2)
Alkaline Phosphatase: 70 U/L (ref 35–104)
Anion Gap: 9 mmol/L (ref 7–16)
BUN: 10 mg/dL (ref 6–20)
CO2: 16 mmol/L — ABNORMAL LOW (ref 22–29)
Calcium: 5.3 mg/dL — CL (ref 8.6–10.2)
Chloride: 115 mmol/L — ABNORMAL HIGH (ref 98–107)
Creatinine: 0.4 mg/dL — ABNORMAL LOW (ref 0.5–1.0)
GFR African American: >60
GFR Non-African American: >60 mL/min/{1.73_m2} (ref >=60)
Glucose: 245 mg/dL — ABNORMAL HIGH (ref 74–109)
Potassium: 4.6 mmol/L (ref 3.5–5.0)
Sodium: 140 mmol/L (ref 132–146)
Total Bilirubin: <0.2 mg/dL (ref 0.0–1.2)
Total Protein: 4.6 g/dL — ABNORMAL LOW (ref 6.4–8.3)

## 2016-07-03 LAB — POC PREGNANCY UR-QUAL: Preg Test, Ur: NEGATIVE

## 2016-07-03 LAB — MICROSCOPIC URINALYSIS

## 2016-07-03 LAB — URINALYSIS
Blood, Urine: NEGATIVE
Glucose, Ur: >=1000 mg/dL — AB
Ketones, Urine: 40 mg/dL — AB
Leukocyte Esterase, Urine: NEGATIVE
Nitrite, Urine: NEGATIVE
Specific Gravity, UA: 1.015 (ref 1.005–1.030)
Urobilinogen, Urine: 0.2 E.U./dL (ref <2.0)
pH, UA: 5.5 (ref 5.0–9.0)

## 2016-07-03 LAB — LACTIC ACID: Lactic Acid: 0.6 mmol/L (ref 0.5–2.2)

## 2016-07-03 LAB — AMYLASE: Amylase: 17 U/L — ABNORMAL LOW (ref 20–100)

## 2016-07-03 LAB — LIPASE: Lipase: 21 U/L (ref 13–60)

## 2016-07-03 MED ORDER — ONDANSETRON HCL 4 MG/2ML IJ SOLN
4 MG/2ML | Freq: Once | INTRAMUSCULAR | Status: AC
Start: 2016-07-03 — End: 2016-07-03
  Administered 2016-07-03: 22:00:00 4 mg via INTRAVENOUS

## 2016-07-03 MED ORDER — CALCIUM GLUCONATE 10 % IV SOLN
10 % | Freq: Once | INTRAVENOUS | Status: AC
Start: 2016-07-03 — End: 2016-07-03
  Administered 2016-07-03: 1 g via INTRAVENOUS

## 2016-07-03 MED ORDER — SODIUM CHLORIDE 0.9 % IV BOLUS
0.9 % | Freq: Once | INTRAVENOUS | Status: AC
Start: 2016-07-03 — End: 2016-07-03
  Administered 2016-07-03: 22:00:00 2000 mL via INTRAVENOUS

## 2016-07-03 MED ORDER — SODIUM CHLORIDE 0.9 % IV BOLUS
0.9 % | Freq: Once | INTRAVENOUS | Status: AC
Start: 2016-07-03 — End: 2016-07-03
  Administered 2016-07-03: 2000 mL via INTRAVENOUS

## 2016-07-03 MED ORDER — DICYCLOMINE HCL 10 MG/ML IM SOLN
10 MG/ML | Freq: Once | INTRAMUSCULAR | Status: AC
Start: 2016-07-03 — End: 2016-07-03
  Administered 2016-07-03: 22:00:00 20 mg via INTRAMUSCULAR

## 2016-07-03 MED ORDER — IOPAMIDOL 76 % IV SOLN
76 % | Freq: Once | INTRAVENOUS | Status: AC | PRN
Start: 2016-07-03 — End: 2016-07-03
  Administered 2016-07-03: 110 mL via INTRAVENOUS

## 2016-07-03 MED FILL — DICYCLOMINE HCL 10 MG/ML IM SOLN: 10 MG/ML | INTRAMUSCULAR | Qty: 2

## 2016-07-03 MED FILL — CALCIUM GLUCONATE 10 % IV SOLN: 10 % | INTRAVENOUS | Qty: 10

## 2016-07-03 MED FILL — ONDANSETRON HCL 4 MG/2ML IJ SOLN: 4 MG/2ML | INTRAMUSCULAR | Qty: 2

## 2016-07-03 NOTE — ED Notes (Signed)
Bed: 11  Expected date:   Expected time:   Means of arrival:   Comments:  ems     Sharin MonsJason J Koenig, RN  07/03/16 [phone]

## 2016-07-03 NOTE — ED Provider Notes (Signed)
??  Department of Emergency Medicine   ED  Provider Note  Admit Date/RoomTime: 07/03/2016  4:33 PM  ED Room: 11/11          History of Present Illness:  07/03/16, Time: 5:17 PM        Maria Valdez is a 50 y.o. female presenting to the ED for nausea, vomiting, and diarrhea, beginning 4 hours ago.  The complaint has been persistent, moderate in severity, and worsened by nothing.  The pt reports nausea and vomiting since 1 PM today while at work. States she has had more than 4 episodes. She c/o upper abdominal pain secondary to vomiting. Pt also reports having diarrhea. Per pt, all she ate was a bologna sandwich today. Pt denies any fever, chills, leg edema, numbness, tingling, chest pain, sob, hematemesis, hematochezia, melena, urinary sx, or any further complaints. Hx of DM, HIV.     Review of Systems:   Pertinent positives and negatives are stated within HPI, all other systems reviewed and are negative.      --------------------------------------------- PAST HISTORY ---------------------------------------------  Past Medical History:  has a past medical history of Diabetes mellitus (HCC); HIV infection (HCC); HTN (hypertension); Thyroid disease; Tobacco use disorder affecting pregnancy, antepartum; and Tobacco use disorder affecting pregnancy, antepartum.    Past Surgical History:  has a past surgical history that includes Thyroid surgery and Cholecystectomy.    Social History:  reports that she has been smoking Cigarettes.  She has been smoking about 0.50 packs per day. She has never used smokeless tobacco. She reports that she does not drink alcohol or use drugs.    Family History: family history is not on file.     The patient???s home medications have been reviewed.    Allergies: Sulfa antibiotics and Codeine      ---------------------------------------------------PHYSICAL EXAM--------------------------------------    Constitutional/General: Alert and oriented x3, well appearing, non toxic in NAD  Head:  Normocephalic and atraumatic  Eyes: PERRL, EOMI, conjunctiva normal  Mouth: Oropharynx clear, handling secretions, no asymmetry of the posterior oropharynx or uvular edema  Neck: Supple, full ROM, non tender to palpation in the midline, no stridor, no crepitus, no meningeal signs  Respiratory: Lungs clear to auscultation bilaterally, no wheezes, rales, or rhonchi. Not in respiratory distress  Cardiovascular:  Regular rate. Regular rhythm. No murmurs, gallops, or rubs. 2+ distal pulses  GI:  Abdomen Soft, Non tender, Non distended.  +BS.   No rebound, guarding, or rigidity. No pulsatile masses.  Musculoskeletal: Moves all extremities x 4. Warm and well perfused, no clubbing, cyanosis, or edema.   Integument: skin warm and dry. No rashes.   Neurologic: GCS 15, no focal deficits, cranial nerves intact  Psychiatric: Normal Affect. No signs or symptoms of depression or psychosis evident.       -------------------------------------------------- RESULTS -------------------------------------------------  I have personally reviewed all laboratory and imaging results for this patient. Results are listed below.     LABS:  Results for orders placed or performed during the hospital encounter of 07/03/16   CBC Auto Differential   Result Value Ref Range    WBC 19.0 (H) 4.5 - 11.5 E9/L    RBC 3.86 3.50 - 5.50 E12/L    Hemoglobin 10.6 (L) 11.5 - 15.5 g/dL    Hematocrit 16.1 (L) 34.0 - 48.0 %    MCV 87.0 80.0 - 99.9 fL    MCH 27.5 26.0 - 35.0 pg    MCHC 31.5 (L) 32.0 - 34.5 %  RDW 13.3 11.5 - 15.0 fL    Platelets 335 130 - 450 E9/L    MPV 10.1 7.0 - 12.0 fL    Neutrophils % 91.0 (H) 43.0 - 80.0 %    Immature Granulocytes % 0.6 0.0 - 5.0 %    Lymphocytes % 2.0 (L) 20.0 - 42.0 %    Monocytes % 5.6 2.0 - 12.0 %    Eosinophils % 0.6 0.0 - 6.0 %    Basophils % 0.2 0.0 - 2.0 %    Neutrophils # 17.29 (H) 1.80 - 7.30 E9/L    Immature Granulocytes # 0.11 E9/L    Lymphocytes # 0.38 (L) 1.50 - 4.00 E9/L    Monocytes # 1.06 (H) 0.10 - 0.95  E9/L    Eosinophils # 0.12 0.05 - 0.50 E9/L    Basophils # 0.04 0.00 - 0.20 E9/L    Anisocytosis 1+     Polychromasia 1+     Poikilocytes 1+     Ovalocytes 1+    Comprehensive Metabolic Panel   Result Value Ref Range    Sodium 140 132 - 146 mmol/L    Potassium 4.6 3.5 - 5.0 mmol/L    Chloride 115 (H) 98 - 107 mmol/L    CO2 16 (L) 22 - 29 mmol/L    Anion Gap 9 7 - 16 mmol/L    Glucose 245 (H) 74 - 109 mg/dL    BUN 10 6 - 20 mg/dL    CREATININE 0.4 (L) 0.5 - 1.0 mg/dL    GFR Non-African American >60 >=60 mL/min/1.73    GFR African American >60     Calcium 5.3 (LL) 8.6 - 10.2 mg/dL    Total Protein 4.6 (L) 6.4 - 8.3 g/dL    Alb 2.6 (L) 3.5 - 5.2 g/dL    Total Bilirubin <7.8<0.2 0.0 - 1.2 mg/dL    Alkaline Phosphatase 70 35 - 104 U/L    ALT 6 0 - 32 U/L    AST 33 (H) 0 - 31 U/L   Amylase   Result Value Ref Range    Amylase 17 (L) 20 - 100 U/L   Lipase   Result Value Ref Range    Lipase 21 13 - 60 U/L   Lactic Acid, Plasma   Result Value Ref Range    Lactic Acid 0.6 0.5 - 2.2 mmol/L   Urinalysis   Result Value Ref Range    Color, UA Yellow Straw/Yellow    Clarity, UA SL CLOUDY Clear    Glucose, Ur >=1000 (A) Negative mg/dL    Bilirubin Urine SMALL (A) Negative    Ketones, Urine 40 (A) Negative mg/dL    Specific Gravity, UA 1.015 1.005 - 1.030    Blood, Urine Negative Negative    pH, UA 5.5 5.0 - 9.0    Protein, UA TRACE Negative mg/dL    Urobilinogen, Urine 0.2 <2.0 E.U./dL    Nitrite, Urine Negative Negative    Leukocyte Esterase, Urine Negative Negative   Microscopic Urinalysis   Result Value Ref Range    Casts FEW /LPF    WBC, UA 1-3 0 - 5 /HPF    RBC, UA NONE 0 - 2 /HPF    Epi Cells MODERATE /HPF    Bacteria, UA FEW (A) /HPF   POC Pregnancy Urine Qual   Result Value Ref Range    Preg Test, Ur negative     QC OK? yes        RADIOLOGY:  Interpreted  by Radiologist.  CT ABDOMEN PELVIS W IV CONTRAST Additional Contrast? None   Final Result   Dilated fluid-filled bowel loops which include small bowel loops and   colon which  may be due to enterocolitis with ileus. Bowel obstruction   is less likely.      Normal appendix.      XR Acute Abd Series Chest 1 VW   Final Result   1. No acute cardiopulmonary process.      2. No free interperitoneal air.      3. There is most likely a gasless appearance of the bowel as above   discussed. Air is predominantly seen in the stomach, the stomach is   air distended but not dilated.        ------------------------- NURSING NOTES AND VITALS REVIEWED ---------------------------  The nursing notes within the ED encounter and vital signs as below have been reviewed by myself.  BP 107/66    Pulse 94    Temp 97.2 ??F (36.2 ??C) (Oral)    Resp 18    Ht 5\' 2"  (1.575 m)    Wt 130 lb (59 kg)    LMP 06/26/2016    SpO2 99%    BMI 23.78 kg/m??   Oxygen Saturation Interpretation: Normal.  The patient???s available past medical records and past encounters were reviewed.      ------------------------------ ED COURSE/MEDICAL DECISION MAKING----------------------  Medications   0.9 % sodium chloride bolus (2,000 mLs Intravenous New Bag 07/03/16 1845)   0.9 % sodium chloride bolus (0 mLs Intravenous Stopped 07/03/16 1846)   ondansetron (ZOFRAN) injection 4 mg (4 mg Intravenous Given 07/03/16 1716)   dicyclomine (BENTYL) injection 20 mg (20 mg Intramuscular Given 07/03/16 1716)   calcium gluconate 10 % injection 1 g (1 g Intravenous Given 07/03/16 1842)   iopamidol (ISOVUE-370) 76 % injection 110 mL (110 mLs Intravenous Given 07/03/16 1855)       Medical Decision Making:   Differential Diagnoses:  Gastritis, Pancreatitis, Appendicitis, Dehydration, Ileus, Bowel Obstruction, UTI, to name a few  This patient's ED course included: a personal history and physicial examination, re-evaluation prior to disposition and IV medications  This patient has remained hemodynamically stable during their ED course.    Re-Evaluations:           Re-evaluation.  Patient???s symptoms are improving    Counseling:  The emergency provider has spoken  with the patient and discussed today???s results, in addition to providing specific details for the plan of care and counseling regarding the diagnosis and prognosis.  Questions are answered at this time and they are agreeable with the plan.       --------------------------------- IMPRESSION AND DISPOSITION ---------------------------------    IMPRESSION  1. Nausea vomiting and diarrhea    2. Generalized abdominal pain        DISPOSITION  Disposition: Discharge to home  Patient condition is stable    07/03/16, 5:17 PM.    This note is prepared by Laverda Sorensonarmen Moradian, acting as Scribe for Delories Heinzaryl Ewel Lona, MD.    Delories Heinzaryl Nemesis Rainwater, MD:  The scribe's documentation has been prepared under my direction and personally reviewed by me in its entirety.  I confirm that the note above accurately reflects all work, treatment, procedures, and medical decision making performed by me.             Delories Heinzaryl Dewel Lotter, MD  07/03/16 [phone]

## 2016-07-04 MED ORDER — PROMETHAZINE HCL 25 MG PO TABS
25 MG | ORAL_TABLET | Freq: Four times a day (QID) | ORAL | 0 refills | Status: AC | PRN
Start: 2016-07-04 — End: 2016-07-08

## 2016-07-04 MED ORDER — FAMOTIDINE 20 MG PO TABS
20 MG | ORAL_TABLET | Freq: Two times a day (BID) | ORAL | 0 refills | Status: AC
Start: 2016-07-04 — End: 2016-07-10

## 2016-07-04 MED ORDER — DICYCLOMINE HCL 10 MG PO CAPS
10 MG | ORAL_CAPSULE | Freq: Four times a day (QID) | ORAL | Status: AC
Start: 2016-07-04 — End: 2017-07-03

## 2016-07-06 LAB — CULTURE, URINE

## 2016-07-10 ENCOUNTER — Encounter: Payer: Self-pay | Admitting: Physician Assistant

## 2016-07-11 ENCOUNTER — Other Ambulatory Visit: Payer: Self-pay

## 2016-07-11 ENCOUNTER — Ambulatory Visit (INDEPENDENT_AMBULATORY_CARE_PROVIDER_SITE_OTHER): Payer: BLUE CROSS/BLUE SHIELD | Admitting: Physician Assistant

## 2016-07-11 ENCOUNTER — Encounter: Payer: Self-pay | Admitting: Physician Assistant

## 2016-07-11 ENCOUNTER — Other Ambulatory Visit: Payer: Self-pay | Admitting: Physician Assistant

## 2016-07-11 VITALS — BP 114/81 | HR 63 | Wt 261.0 lb

## 2016-07-11 DIAGNOSIS — H1013 Acute atopic conjunctivitis, bilateral: Secondary | ICD-10-CM | POA: Insufficient documentation

## 2016-07-11 DIAGNOSIS — K219 Gastro-esophageal reflux disease without esophagitis: Secondary | ICD-10-CM | POA: Insufficient documentation

## 2016-07-11 DIAGNOSIS — I519 Heart disease, unspecified: Secondary | ICD-10-CM

## 2016-07-11 DIAGNOSIS — E010 Iodine-deficiency related diffuse (endemic) goiter: Secondary | ICD-10-CM

## 2016-07-11 DIAGNOSIS — H04203 Unspecified epiphora, bilateral lacrimal glands: Secondary | ICD-10-CM | POA: Insufficient documentation

## 2016-07-11 DIAGNOSIS — G43009 Migraine without aura, not intractable, without status migrainosus: Secondary | ICD-10-CM

## 2016-07-11 DIAGNOSIS — Z0000 Encounter for general adult medical examination without abnormal findings: Secondary | ICD-10-CM

## 2016-07-11 DIAGNOSIS — I5189 Other ill-defined heart diseases: Secondary | ICD-10-CM | POA: Insufficient documentation

## 2016-07-11 DIAGNOSIS — I351 Nonrheumatic aortic (valve) insufficiency: Secondary | ICD-10-CM | POA: Insufficient documentation

## 2016-07-11 MED ORDER — AMBULATORY NON FORMULARY MEDICATION: 2 refills | Status: DC

## 2016-07-11 MED ORDER — SUMATRIPTAN 20 MG/ACT NA SOLN: 20.0000 mg | Freq: Once | NASAL | 0 refills | Status: DC

## 2016-07-11 MED ORDER — AMBULATORY NON FORMULARY MEDICATION: 2 refills | Status: AC

## 2016-07-11 MED ORDER — ESOMEPRAZOLE MAGNESIUM 40 MG PO CPDR: 40.0000 mg | DELAYED_RELEASE_CAPSULE | Freq: Every day | ORAL | 5 refills | Status: DC

## 2016-07-11 NOTE — Patient Instructions (Signed)
Take Tylenol 1000 mg (2 extra strength pills) or Ibuprofen (motrin / advil) 600 mg at the onset of headache Take Benadryl 25mg  (1 pill) as needed for nausea Take your headache medication as prescribed. Do not dose more than twice in 2 hours. Do not use more than 3 days/week. Avoid lights/screens. Rest in a dark room Follow-up if symptoms worsen or do not improve   I have also ordered fasting labs. The lab is a walk-in open M-F 8a-5p (closed 12:30-1:30p). Nothing to eat or drink after midnight or at least 8 hours before your blood draw. You can have water and your medications.  Sumatriptan nasal spray What is this medicine? SUMATRIPTAN (soo ma TRIP tan) is used to treat migraines with or without aura. An aura is a strange feeling or visual disturbance that warns you of an attack. It is not used to prevent migraines. This medicine may be used for other purposes; ask your health care provider or pharmacist if you have questions. COMMON BRAND NAME(S): Imitrex What should I tell my health care provider before I take this medicine? They need to know if you have any of these conditions: -circulation problems in fingers and toes -diabetes -heart disease -high blood pressure -high cholesterol -history of irregular heartbeat -history of stroke -kidney disease -liver disease -postmenopausal or surgical removal of uterus and ovaries -seizures -smoke tobacco -stomach or intestine problems -an unusual or allergic reaction to sumatriptan, other medicines, foods, dyes, or preservatives -pregnant or trying to get pregnant -breast-feeding How should I use this medicine? This medicine is for use in the nose. Follow the directions on the prescription label. This medicine is taken at the first symptoms of a migraine. It is not for everyday use. Do not take your medicine more often than directed. Talk to your pediatrician regarding the use of this medicine in children. Special care may be  needed. Overdosage: If you think you have taken too much of this medicine contact a poison control center or emergency room at once. NOTE: This medicine is only for you. Do not share this medicine with others. What if I miss a dose? This does not apply; this medicine is not for regular use. What may interact with this medicine? Do not take this medicine with any of the following medicines: -cocaine -ergot alkaloids like dihydroergotamine, ergonovine, ergotamine, methylergonovine -feverfew -MAOIs like Carbex, Eldepryl, Marplan, Nardil, and Parnate -other medicines for migraine headache like almotriptan, eletriptan, frovatriptan, naratriptan, rizatriptan, zolmitriptan -tryptophan This medicine may also interact with the following medications: -certain medicines for depression, anxiety, or psychotic disturbances This list may not describe all possible interactions. Give your health care provider a list of all the medicines, herbs, non-prescription drugs, or dietary supplements you use. Also tell them if you smoke, drink alcohol, or use illegal drugs. Some items may interact with your medicine. What should I watch for while using this medicine? Only take this medicine for a migraine headache. Take it if you get warning symptoms or at the start of a migraine attack. It is not for regular use to prevent migraine attacks. You may get drowsy or dizzy. Do not drive, use machinery, or do anything that needs mental alertness until you know how this medicine affects you. To reduce dizzy or fainting spells, do not sit or stand up quickly, especially if you are an older patient. Alcohol can increase drowsiness, dizziness and flushing. Avoid alcoholic drinks. Smoking cigarettes may increase the risk of heart-related side effects from using this medicine. If you  take migraine medicines for 10 or more days a month, your migraines may get worse. Keep a diary of headache days and medicine use. Contact your  healthcare professional if your migraine attacks occur more frequently. What side effects may I notice from receiving this medicine? Side effects that you should report to your doctor or health care professional as soon as possible: -allergic reactions like skin rash, itching or hives, swelling of the face, lips, or tongue -bloody or watery diarrhea -hallucination, loss of contact with reality -pain, tingling, numbness in the face, hands, or feet -seizures -signs and symptoms of a blood clot such as breathing problems; changes in vision; chest pain; severe, sudden headache; pain, swelling, warmth in the leg; trouble speaking; sudden numbness or weakness of the face, arm, or leg -signs and symptoms of a dangerous change in heartbeat or heart rhythm like chest pain; dizziness; fast or irregular heartbeat; palpitations, feeling faint or lightheaded; falls; breathing problems -signs and symptoms of a stroke like changes in vision; confusion; trouble speaking or understanding; severe headaches; sudden numbness or weakness of the face, arm, or leg; trouble walking; dizziness; loss of balance or coordination -stomach pain Side effects that usually do not require medical attention (report to your doctor or health care professional if they continue or are bothersome): -changes in taste -facial flushing -headache -muscle cramps -muscle pain -nausea, vomiting -weak or tired This list may not describe all possible side effects. Call your doctor for medical advice about side effects. You may report side effects to FDA at 1-800-FDA-1088. Where should I keep my medicine? Keep out of the reach of children. Store at room temperature between 2 and 30 degrees C (36 and 86 degrees F). Throw away any unused medicine after the expiration date. NOTE: This sheet is a summary. It may not cover all possible information. If you have questions about this medicine, talk to your doctor, pharmacist, or health care  provider.  2017 Elsevier/Gold Standard (2015-08-03 12:43:05)

## 2016-07-11 NOTE — Progress Notes (Signed)
HPI:                                                                Valerie Daniel is a 50 y.o. female who presents to [facility] today to establish care and refill meds  Obesity (BMI 42.77): She has lost an additional 29 pounds over the last 8 months. She is taking a compounded weight loss med (HCG/methylcobalamin/inositol)  that was originally prescribed in [place] and this is working well for her.  GERD: controlled on Nexium 40mg .  Migraines: patient has a history of headaches for which she is prescribed Imitrex tabs. She has never been evaluated by neurology or had head imaging. She c/o of a mild migraine today. States her triggers are stress, menstruation, and allergies. Is requesting Imitrex intranasal. She endorses episodic dizziness that she attributes to "car sickness."  She denies aura, but occasionally has "floaters" in her visual field. States her headaches last 3-5 days. She takes Imitrex multiple times per day consecutively during headache days. Patient is having numerous headache days. She has filled her prescription 6 times in the last 3 months (approx. 54 pills).  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 Currently using antihistamine eye drops twice a day. She has seen her eye doctor for this and was told to use OTC dry eye drops. These did not help. Prescription lenses are up to date. She states that her eyes water profusely with straining such as driving long distances or watching television. Feels like her eyeball is throbbing. Pain resolves with rest. Pain is not worsened with eye movement. Denies eyelid heaviness or drooping. Denies partial or complete vision loss. Denies double vision. Denies darkened field of vision. Denies photopsia.  Patient gave me a list of medications that are offered to her at no cost due to an arrangement between her employer and a [employer]. She specifically  requests Nexium, Imitrex intranasal, and "something on the list" for her eyes.   Health Maintenance Health Maintenance  Topic Date Due  . HIV Screening  05/25/1983  . TETANUS/TDAP  05/25/1987  . PAP SMEAR  05/24/1989  . INFLUENZA VACCINE  02/13/2016  - Mammogram: up to date. Hx of right breast cyst. No malignancy   GYN/Sexual Health  Menstrual status: having periods  LMP: last week  Menses: regular, 4-5 days  Last pap smear: 7 years ago  History of abnormal pap smears: no  Sexually active: no   Current contraception: no  Health Habits  Diet: low fat, high protein  Exercise: cardio, treadmill  ETOH: no  Tobacco: no  Drugs: no  Dental Exam: established, next cleaning next month  Eye Exam: up to date  Past Medical History:  Diagnosis Date  . Allergy   . GERD (gastroesophageal reflux disease)   . History of gallstones 01/20/2013  . Skin cancer    Past Surgical History:  Procedure Laterality Date  . CHOLECYSTECTOMY     Social History  Substance Use Topics  . Smoking status: Never Smoker  . Smokeless tobacco: Not on file  . Alcohol use 0.5 oz/week    1 drink(s) per week   family history includes Thyroid disease in her mother and sister.  Review of Systems  Constitutional:  Positive for weight loss (intentional). Negative for chills, fever and malaise/fatigue.  HENT: Positive for tinnitus (occasional). Negative for hearing loss.   Eyes: Positive for blurred vision (wears corrective lenses) and pain.       Floaters  Respiratory: Negative.   Cardiovascular: Positive for leg swelling (chronic). Negative for chest pain and palpitations.  Gastrointestinal: Positive for heartburn.  Genitourinary: Negative.   Musculoskeletal: Negative.   Neurological: Positive for dizziness and headaches (migraines).  Endo/Heme/Allergies: Positive for environmental allergies.  Psychiatric/Behavioral: Negative.     Medications: Current Outpatient Prescriptions   Medication Sig Dispense Refill  . AMBULATORY NON FORMULARY MEDICATION HCG/methylcobalamin/inositol 500 IU troche dissolved in the mouth daily. 108 Troche 2  . azelastine (OPTIVAR) 0.05 % ophthalmic solution Place 1 drop into both eyes 2 (two) times daily. To prevent eye allergies. 6 mL 12  . cetirizine (ZYRTEC) 10 MG tablet Take 1 tablet (10 mg total) by mouth daily. 30 tablet 6  . esomeprazole (NEXIUM) 40 MG capsule Take 1 capsule (40 mg total) by mouth daily. 30 capsule 5  . mometasone (NASONEX) 50 MCG/ACT nasal spray Place 2 sprays into the nose daily. 17 g 6  . SUMAtriptan (IMITREX) 20 MG/ACT nasal spray Place 1 spray (20 mg total) into the nose once. 1 Act 0   No current facility-administered medications for this visit.    Allergies  Allergen Reactions  . Asa [Aspirin]   . Codeine        Objective:  BP 114/81   Pulse 63   Wt 261 lb (118.4 kg)   BMI 42.77 kg/m  Gen: well-groomed, obese, not ill-appearing, no distress HEENT:  Eyes: normal lids without edema or discharge, conjunctiva pink, no scleral injection, no ptosis, no chemosis,  TM's clear,  oropharynx clear, moist mucus membranes, there is thyromegaly without tenderness Lungs: Normal work of breathing, clear to auscultation bilaterally Heart: Normal rate, regular rhythm, s1 and s2 distinct, grade II/VI aortic ejection murmur that radiates to the right carotid Abd: Soft. Nondistended, Nontender Neuro: alert and oriented x 3, EOM's intact, PERRLA,  Extremities: distal pulses intact, trace nonpitting peripheral edema, normal gait Skin: warm and dry, no rashes or lesions on exposed skin Psych: normal affect, pleasant mood, normal speech and thought content   Assessment and Plan: 50 y.o. female with   Migraine without aura and without status migrainosus, not intractable - I suspect patient is having rebound headaches from triptan overuse - recommended MRI Brain w/ and w/o contrast. I was able to get her a same-day  appointment. Patient refused. Risks and benefits were discussed. Will not offer migraine prophylaxis without imaging - offered to treat her current migraine in the office today. Patient declined. - patient requested to try intranasal Imitrex. Counseled her in depth on proper use of abortive migraine medication and how to avoid rebound headaches. Also counseled that intranasal route may have more intense side effects initially - SUMAtriptan (IMITREX) 20 MG/ACT nasal spray; Place 1 spray (20 mg total) into the nose once.  Dispense: 1 Act; Refill: 0  Severe obesity (BMI >= 40) (HCC) - AMBULATORY NON FORMULARY MEDICATION; HCG/methylcobalamin/inositol 500 IU troche dissolved in the mouth daily.  Dispense: 108 Troche; Refill: 2   Gastroesophageal reflux disease, esophagitis presence not specified - esomeprazole (NEXIUM) 40 MG capsule; Take 1 capsule (40 mg total) by mouth daily.  Dispense: 30 capsule; Refill: 5  Watery eyes - Ambulatory referral to Ophthalmology  Encounter for preventative adult health care examination - CBC - Hemoglobin A1c -  Lipid panel - TSH - Comprehensive metabolic panel - Refused cervical cancer screening. Risks and benefits discussed. - Declined influenza vaccine  Aortic ejection murmur - In reviewing patient's chart, a murmur was noted by Dr. Ileene Rubens on 03/08/13. A Grade 1/6 holosystolic murmur at the left second intercostal space nonradiating was noted at that time. Patient underwent ECHO, which showed normal valves and mild LVH/diastolic dysfunction. Family hx is significant for sudden cardiac death in her brother and AS in her father. - I recommended a repeat ECHO due to the different murmur appreciated on today's exam. Patient declined. Risks and benefits were discussed.  Thyromegaly - patient has a history of thyroid nodules. Ultrasound performed on 01/27/13 showed small bilateral nodules that did not meet criteria for biopsy. Thyroid hormone and TSH were also normal  at that time. She also had a normal I-131 uptake on 03/18/13 Lab Results  Component Value Date   TSH 1.081 03/08/2013  - ordered repeat TSH today  Patient education and anticipatory guidance given Patient agrees with treatment plan Follow-up in 6 months or sooner as needed  Darlyne Russian PA-C

## 2016-07-23 ENCOUNTER — Other Ambulatory Visit: Payer: Self-pay

## 2016-07-23 DIAGNOSIS — K219 Gastro-esophageal reflux disease without esophagitis: Secondary | ICD-10-CM

## 2016-07-23 DIAGNOSIS — G43009 Migraine without aura, not intractable, without status migrainosus: Secondary | ICD-10-CM

## 2016-07-23 MED ORDER — ESOMEPRAZOLE MAGNESIUM 40 MG PO CPDR: 40.0000 mg | DELAYED_RELEASE_CAPSULE | Freq: Every day | ORAL | 5 refills | Status: AC

## 2016-07-23 MED ORDER — SUMATRIPTAN 20 MG/ACT NA SOLN: 20.0000 mg | Freq: Once | NASAL | 0 refills | Status: DC

## 2016-07-25 ENCOUNTER — Other Ambulatory Visit: Payer: Self-pay

## 2016-07-25 DIAGNOSIS — G43009 Migraine without aura, not intractable, without status migrainosus: Secondary | ICD-10-CM

## 2016-07-25 MED ORDER — SUMATRIPTAN 20 MG/ACT NA SOLN: 20.0000 mg | Freq: Once | NASAL | 5 refills | Status: DC

## 2016-08-02 ENCOUNTER — Other Ambulatory Visit: Payer: Self-pay | Admitting: Physician Assistant

## 2016-08-06 NOTE — Telephone Encounter (Signed)
Valerie Daniel, Please tell her she needs to come in to see me about her headaches. She should not be taking both intranasal and oral imitrex.

## 2016-08-06 NOTE — Telephone Encounter (Signed)
Is this refill appropriate? Please advise 

## 2016-08-07 ENCOUNTER — Other Ambulatory Visit: Payer: Self-pay | Admitting: Physician Assistant

## 2016-08-07 ENCOUNTER — Telehealth: Payer: Self-pay

## 2016-08-07 NOTE — Telephone Encounter (Signed)
Recommendations left on vm pt advised to call with any questions

## 2016-08-07 NOTE — Telephone Encounter (Signed)
Pt reports that she is not happy with not getting a 90 day supply of the Imitrex nasal spray.  She stated that she was taking the Rx wrong. But that she's taking it correctly she feels that its helping her.  I did suggest that she make an appointment she stated that because she was just here in Dec. she will not make an appointment plus she would have to pay $500 out of pocket for the visit. She is also refusing to have any imaging done. Please advise.

## 2016-08-07 NOTE — Telephone Encounter (Signed)
Please explain that a 90-day supply for this medication is the equivalent to covering her for 12 headaches, 4 headaches per 30-day period. That is per the safety recommendations of the manufacturer. I am happy to write the prescription as dispense 12 all at once if this is more convenient for her. But she must understand that using the medicine more than 4 times in a month is not safe, and we should be doing other things to control her headaches better if that is the case.

## 2016-08-09 ENCOUNTER — Other Ambulatory Visit: Payer: Self-pay | Admitting: Physician Assistant

## 2016-08-09 NOTE — Telephone Encounter (Signed)
Per Evlyn Clines patient should get lab work done before any other medications are Rx for her.  Left recommendations on vm.  Pt advised to call with questions and concerns.

## 2016-09-06 DIAGNOSIS — Z0000 Encounter for general adult medical examination without abnormal findings: Secondary | ICD-10-CM | POA: Diagnosis not present

## 2016-09-06 LAB — CBC
HCT: 34.7 % — ABNORMAL LOW (ref 35.0–45.0)
HEMOGLOBIN: 10.8 g/dL — AB (ref 11.7–15.5)
MCH: 27.5 pg (ref 27.0–33.0)
MCHC: 31.1 g/dL — ABNORMAL LOW (ref 32.0–36.0)
MCV: 88.3 fL (ref 80.0–100.0)
MPV: 11.2 fL (ref 7.5–12.5)
PLATELETS: 254 10*3/uL (ref 140–400)
RBC: 3.93 MIL/uL (ref 3.80–5.10)
RDW: 14.4 % (ref 11.0–15.0)
WBC: 6.1 10*3/uL (ref 3.8–10.8)

## 2016-09-06 LAB — HEMOGLOBIN A1C
HEMOGLOBIN A1C: 5.2 % (ref <5.7)
MEAN PLASMA GLUCOSE: 103 mg/dL

## 2016-09-07 LAB — COMPREHENSIVE METABOLIC PANEL
ALT: 13 U/L (ref 6–29)
AST: 18 U/L (ref 10–35)
Albumin: 4 g/dL (ref 3.6–5.1)
Alkaline Phosphatase: 50 U/L (ref 33–115)
BILIRUBIN TOTAL: 0.4 mg/dL (ref 0.2–1.2)
BUN: 16 mg/dL (ref 7–25)
CALCIUM: 9.4 mg/dL (ref 8.6–10.2)
CO2: 28 mmol/L (ref 20–31)
Chloride: 104 mmol/L (ref 98–110)
Creat: 0.78 mg/dL (ref 0.50–1.10)
Glucose, Bld: 85 mg/dL (ref 65–99)
POTASSIUM: 4.6 mmol/L (ref 3.5–5.3)
Sodium: 138 mmol/L (ref 135–146)
TOTAL PROTEIN: 6.6 g/dL (ref 6.1–8.1)

## 2016-09-07 LAB — LIPID PANEL
CHOLESTEROL: 183 mg/dL (ref <200)
HDL: 68 mg/dL (ref >50)
LDL Cholesterol: 105 mg/dL — ABNORMAL HIGH (ref <100)
TRIGLYCERIDES: 52 mg/dL (ref <150)
Total CHOL/HDL Ratio: 2.7 Ratio (ref <5.0)
VLDL: 10 mg/dL (ref <30)

## 2016-09-07 LAB — TSH: TSH: 1.27 mIU/L

## 2016-09-09 ENCOUNTER — Telehealth: Payer: Self-pay | Admitting: Physician Assistant

## 2016-09-09 DIAGNOSIS — D649 Anemia, unspecified: Secondary | ICD-10-CM

## 2016-09-09 NOTE — Telephone Encounter (Signed)
Patient found to have normocytic anemia. Ordering iron panel and reticulocytes.

## 2016-09-10 ENCOUNTER — Telehealth: Payer: Self-pay | Admitting: Physician Assistant

## 2016-09-10 MED ORDER — SUMATRIPTAN 20 MG/ACT NA SOLN: 20.0000 mg | Freq: Once | NASAL | 0 refills | Status: DC | PRN

## 2016-09-10 NOTE — Telephone Encounter (Signed)
-----   Message from Delrae Alfred, [place] sent at 09/10/2016  2:45 PM EST ----- Pt notified. She is wanting to know will you put refills on her 90 day supply of Imitrex. (fax to [phone])  She also stated that she's been anemic all her life and is the iron panel necessary?

## 2016-09-10 NOTE — Telephone Encounter (Signed)
Patient states she has had anemia her entire life. Declines further work-up.

## 2016-09-16 ENCOUNTER — Other Ambulatory Visit: Payer: Self-pay | Admitting: Physician Assistant

## 2016-09-16 MED ORDER — SUMATRIPTAN 20 MG/ACT NA SOLN: 20.0000 mg | Freq: Once | NASAL | 0 refills | Status: DC | PRN

## 2016-09-26 ENCOUNTER — Telehealth: Payer: Self-pay

## 2016-09-26 NOTE — Telephone Encounter (Signed)
Imitrex Rx failed three times through fax [phone]).  Contacted pt and got the phone number [phone]) to pharmacy.  Pharmacist stated that they never got a fax.  Gave pharmacist verbal as written. Imitrex 20MG /ACT. SIG: One spray into the nose once PRN for migraines.  QTY: 18 inhalers  REFILLS: 5  This is considered a 90 day supply. -EH/RMA

## 2017-06-16 ENCOUNTER — Inpatient Hospital Stay: Admit: 2017-06-16 | Payer: PRIVATE HEALTH INSURANCE | Primary: Family Medicine

## 2017-06-16 DIAGNOSIS — E0590 Thyrotoxicosis, unspecified without thyrotoxic crisis or storm: Secondary | ICD-10-CM

## 2017-06-16 LAB — ECHOCARDIOGRAM COMPLETE 2D W DOPPLER W COLOR: Left Ventricular Ejection Fraction: 63

## 2017-07-17 ENCOUNTER — Ambulatory Visit: Payer: BLUE CROSS/BLUE SHIELD | Admitting: Physician Assistant

## 2017-07-17 ENCOUNTER — Ambulatory Visit (INDEPENDENT_AMBULATORY_CARE_PROVIDER_SITE_OTHER): Payer: BLUE CROSS/BLUE SHIELD | Admitting: Physician Assistant

## 2017-07-17 VITALS — BP 169/98 | HR 70 | Temp 98.1°F | Resp 16 | Ht 65.5 in | Wt 334.1 lb

## 2017-07-17 DIAGNOSIS — Z9119 Patient's noncompliance with other medical treatment and regimen: Secondary | ICD-10-CM

## 2017-07-17 DIAGNOSIS — R519 Headache, unspecified: Secondary | ICD-10-CM

## 2017-07-17 DIAGNOSIS — J22 Unspecified acute lower respiratory infection: Secondary | ICD-10-CM

## 2017-07-17 DIAGNOSIS — R51 Headache: Secondary | ICD-10-CM | POA: Diagnosis not present

## 2017-07-17 DIAGNOSIS — Z91199 Patient's noncompliance with other medical treatment and regimen due to unspecified reason: Secondary | ICD-10-CM

## 2017-07-17 DIAGNOSIS — J00 Acute nasopharyngitis [common cold]: Secondary | ICD-10-CM | POA: Diagnosis not present

## 2017-07-17 LAB — POCT INFLUENZA A/B
INFLUENZA B, POC: NEGATIVE
Influenza A, POC: NEGATIVE

## 2017-07-17 MED ORDER — SUMATRIPTAN 20 MG/ACT NA SOLN: 20.0000 mg | Freq: Once | NASAL | 0 refills | Status: AC | PRN

## 2017-07-17 MED ORDER — SUMATRIPTAN 20 MG/ACT NA SOLN: 20.0000 mg | Freq: Once | NASAL | 1 refills | Status: AC | PRN

## 2017-07-17 MED ORDER — ALBUTEROL SULFATE HFA 108 (90 BASE) MCG/ACT IN AERS: 1.0000 | INHALATION_SPRAY | RESPIRATORY_TRACT | 0 refills | Status: AC | PRN

## 2017-07-17 MED ORDER — IPRATROPIUM BROMIDE 0.06 % NA SOLN: 1.0000 | Freq: Four times a day (QID) | NASAL | 0 refills | Status: AC | PRN

## 2017-07-17 MED ORDER — PREDNISONE 50 MG PO TABS: ORAL_TABLET | ORAL | 0 refills | Status: AC

## 2017-07-17 NOTE — Progress Notes (Signed)
HPI:                                                                Valerie Daniel is a 52 y.o. female who presents to [place]: [place] today for cough and eadache  Cough  This is a new problem. The current episode started in the past 7 days. The problem has been gradually worsening. The problem occurs every few minutes. The cough is productive of sputum. Associated symptoms include headaches, nasal congestion, rhinorrhea and wheezing. Pertinent negatives include no chest pain or fever. The symptoms are aggravated by exercise and cold air. Treatments tried: Mucinex. The treatment provided no relief. There is no history of asthma or COPD.   Patient reports bilateral severe, frontal headache that she describes as "100 times worse than my migraine." Patient has had migraines without aura for over 30 years. Headache began 2 days ago. States headache is throbbing, worse with bending forward. Denies associated photophobia, phonophobia. She has had some nausea and 1 episode of vomiting. She took her usual abortive therapy of Imitrex IM without any improvement. Did not respond to Tylenol or Aleve.  Past Medical History:  Diagnosis Date  . Allergy   . GERD (gastroesophageal reflux disease)   . History of gallstones 01/20/2013  . Skin cancer    Past Surgical History:  Procedure Laterality Date  . CHOLECYSTECTOMY     Social History   Tobacco Use  . Smoking status: Never Smoker  . Smokeless tobacco: Never Used  Substance Use Topics  . Alcohol use: Yes    Alcohol/week: 0.5 oz    Types: 1 Standard drinks or equivalent per week   family history includes Heart disease in her father; Sudden Cardiac Death in her brother; Thyroid disease in her mother and sister.  ROS: negative except as noted in the HPI  Medications: Current Outpatient Medications  Medication Sig Dispense Refill  . AMBULATORY NON FORMULARY MEDICATION HCG/methylcobalamin/inositol 500 IU  troche dissolved in the mouth daily. 108 Troche 2  . azelastine (OPTIVAR) 0.05 % ophthalmic solution Place 1 drop into both eyes 2 (two) times daily. To prevent eye allergies. 6 mL 12  . cetirizine (ZYRTEC) 10 MG tablet Take 1 tablet (10 mg total) by mouth daily. 30 tablet 6  . esomeprazole (NEXIUM) 40 MG capsule Take 1 capsule (40 mg total) by mouth daily. 30 capsule 5  . mometasone (NASONEX) 50 MCG/ACT nasal spray Place 2 sprays into the nose daily. 17 g 6  . SUMAtriptan (IMITREX) 20 MG/ACT nasal spray Place 1 spray (20 mg total) into the nose once as needed for migraine. Do not use more than twice in 24 hours. Do not use more than 3 days per week. 18 Inhaler 1  . albuterol (PROVENTIL HFA;VENTOLIN HFA) 108 (90 Base) MCG/ACT inhaler Inhale 1-2 puffs into the lungs every 4 (four) hours as needed for wheezing or shortness of breath. 1 Inhaler 0  . ipratropium (ATROVENT) 0.06 % nasal spray Place 1 spray into both nostrils 4 (four) times daily as needed. 15 mL 0  . predniSONE (DELTASONE) 50 MG tablet One tab PO daily for 5 days. 5 tablet 0   No current facility-administered medications for this visit.    Allergies  Allergen Reactions  .  Asa [Aspirin]   . Codeine        Objective:  BP (!) 169/98   Pulse 70   Temp 98.1 F (36.7 C) (Oral)   Resp 16   Ht 5' 5.5" (1.664 m)   Wt (!) 334 lb 1.9 oz (151.6 kg)   SpO2 96%   BMI 54.75 kg/m  Gen: well-groomed, ill-appearing, not toxic-appearing, no acute distress, obese female HEENT: head normocephalic, atraumatic; conjunctiva and cornea clear, wearing glasses, nasal mucosa edematous, oropharynx clear, moist mucus membranes, neck supple, no adenopathy, trachea midline Pulm: Normal work of breathing, normal phonation, breath sounds diffusely coarse CV: Normal rate, regular rhythm, s1 and s2 distinct, grade III/VI systolic murmur, no clicks or rubs Neuro:  cranial nerves II-XII intact, no nystagmus, normal finger-to-nose, normal heel-to-shin,  negative pronator drift, normal rapid alternating movements, normal tone, no tremor MSK: strength 5/5 and symmetric in bilateral upper and lower extremities, normal gait and station, negative Romberg Mental Status: alert and oriented x 3, speech articulate, and thought processes clear and goal-directed  No flowsheet data found.   Results for orders placed or performed in visit on 07/17/17 (from the past 72 hour(s))  POCT Influenza A/B     Status: Normal   Collection Time: 07/17/17  4:55 PM  Result Value Ref Range   Influenza A, POC Negative Negative   Influenza B, POC Negative Negative   No results found.    Assessment and Plan: 52 y.o. female with   1. Acute intractable headache, unspecified headache type - patient with migraine reporting worst headache of life not consistent with usual migraine pattern. Reassuring neuro exam today. Ordering stat CT head to r/o SAH. Patient instructed to proceed to [facility] asap - patient requested refills of her Imitrex. She is overdue for her 26-month follow-up appointment and has not had routine health maintenance. Provided with short supply, but instructed she will need an appointment for additional refills - CT Head Wo Contrast; Future  2. Lower respiratory infection - POCT Influenza A/B negative. Treating for viral bronchitis - predniSONE (DELTASONE) 50 MG tablet; One tab PO daily for 5 days.  Dispense: 5 tablet; Refill: 0 - albuterol (PROVENTIL HFA;VENTOLIN HFA) 108 (90 Base) MCG/ACT inhaler; Inhale 1-2 puffs into the lungs every 4 (four) hours as needed for wheezing or shortness of breath.  Dispense: 1 Inhaler; Refill: 0  3. Acute rhinitis - POCT Influenza A/B - ipratropium (ATROVENT) 0.06 % nasal spray; Place 1 spray into both nostrils 4 (four) times daily as needed.  Dispense: 15 mL; Refill: 0     Patient education and anticipatory guidance given Patient agrees with treatment plan Follow-up in 1 week for medication  refills or sooner as needed if symptoms worsen or fail to improve  Darlyne Russian PA-C

## 2017-07-17 NOTE — Patient Instructions (Addendum)

## 2017-07-18 ENCOUNTER — Telehealth: Payer: Self-pay

## 2017-07-18 ENCOUNTER — Ambulatory Visit (HOSPITAL_BASED_OUTPATIENT_CLINIC_OR_DEPARTMENT_OTHER): Payer: BLUE CROSS/BLUE SHIELD

## 2017-07-18 NOTE — Telephone Encounter (Signed)
Please remind patient that she still needs a follow-up appointment for chronic medical conditions (high blood pressure, migraines, acid reflux). I sent a 1 month supply of her Imitrex to her specialty pharmacy. I will not be providing any additional refills. It is the standard of care to see patients at a minimum of every 6 months for management of chronic conditions and every 3 months for uncontrolled conditions as well as annual labs. This is for her safety and health.

## 2017-07-18 NOTE — Telephone Encounter (Signed)
Scotti called and states she is unable to afford the CT because her car broke down yesterday and now she has the cost to have it fixed.

## 2017-07-18 NOTE — Telephone Encounter (Signed)
Left message advising or recommendations.  

## 2017-07-28 ENCOUNTER — Encounter: Payer: Self-pay | Admitting: Physician Assistant

## 2017-07-28 DIAGNOSIS — Z9119 Patient's noncompliance with other medical treatment and regimen: Secondary | ICD-10-CM | POA: Insufficient documentation

## 2017-07-28 DIAGNOSIS — R51 Headache: Secondary | ICD-10-CM

## 2017-07-28 DIAGNOSIS — Z91199 Patient's noncompliance with other medical treatment and regimen due to unspecified reason: Secondary | ICD-10-CM | POA: Insufficient documentation

## 2017-07-28 DIAGNOSIS — R519 Headache, unspecified: Secondary | ICD-10-CM | POA: Insufficient documentation

## 2017-08-25 ENCOUNTER — Other Ambulatory Visit: Payer: Self-pay

## 2017-08-25 DIAGNOSIS — K219 Gastro-esophageal reflux disease without esophagitis: Secondary | ICD-10-CM

## 2018-09-09 ENCOUNTER — Emergency Department: Admit: 2018-09-10 | Payer: PRIVATE HEALTH INSURANCE | Primary: Family Medicine

## 2018-09-09 ENCOUNTER — Inpatient Hospital Stay
Admit: 2018-09-09 | Discharge: 2018-09-10 | Disposition: A | Discharge status: Home / Self Care | Payer: PRIVATE HEALTH INSURANCE | Attending: Emergency Medicine

## 2018-09-09 DIAGNOSIS — J189 Pneumonia, unspecified organism: Secondary | ICD-10-CM

## 2018-09-09 NOTE — ED Provider Notes (Signed)
53 year old female presents to the emergency department with multiple complaints including shortness of breath feeling of anxiety abdominal pain and back pain worse when she coughs.  She did have a recent dental surgery several weeks ago and is have the symptoms since then.  There is also noted nasal congestion.  Patient is a history of half pack a day smoker.  She states no chest pain or the chest pain noted on cough.  Patient denies fevers.  She states no nausea vomiting diarrhea or abdominal pain.  No other complaints at this time.    The history is provided by the patient.   Shortness of Breath   Severity:  Mild  Onset quality:  Gradual  Timing:  Intermittent  Progression:  Waxing and waning  Chronicity:  New  Relieved by:  Nothing  Worsened by:  Nothing  Ineffective treatments:  None tried  Associated symptoms: chest pain, cough, headaches and wheezing    Associated symptoms: no abdominal pain, no fever, no PND, no rash, no sputum production, no syncope, no swollen glands and no vomiting    Risk factors: tobacco use         Review of Systems   Constitutional: Negative for chills and fever.   Respiratory: Positive for cough, shortness of breath and wheezing. Negative for sputum production.    Cardiovascular: Positive for chest pain. Negative for syncope and PND.   Gastrointestinal: Negative for abdominal pain, blood in stool, nausea and vomiting.   Genitourinary: Negative for dysuria and frequency.   Musculoskeletal: Negative for back pain.   Skin: Negative for rash.   Neurological: Positive for headaches. Negative for weakness.   All other systems reviewed and are negative.       Physical Exam  Constitutional:       Appearance: Normal appearance.   HENT:      Head: Normocephalic and atraumatic.      Right Ear: Tympanic membrane normal.      Left Ear: Tympanic membrane normal.      Mouth/Throat:      Mouth: Mucous membranes are moist.   Eyes:      Extraocular Movements: Extraocular movements intact.       Pupils: Pupils are equal, round, and reactive to light.   Cardiovascular:      Rate and Rhythm: Normal rate and regular rhythm.      Pulses: Normal pulses.      Heart sounds: No murmur.   Pulmonary:      Effort: Pulmonary effort is normal. No respiratory distress.      Breath sounds: Normal breath sounds.   Abdominal:      General: Abdomen is flat. Bowel sounds are normal. There is no distension.      Tenderness: There is no abdominal tenderness.   Musculoskeletal: Normal range of motion.      Right lower leg: No edema.   Skin:     General: Skin is warm.      Capillary Refill: Capillary refill takes less than 2 seconds.   Neurological:      General: No focal deficit present.      Mental Status: She is alert and oriented to person, place, and time.          Procedures     MDM  Number of Diagnoses or Management Options  Generalized weakness:   Hyperglycemia:   Pneumonia due to organism:   Viral syndrome:   Diagnosis management comments: Patient was seen and examined.  Labs and imaging  were initiated.  Patient was noted to be hyperglycemic there was a slight elevation of d-dimer so CTA of the chest was performed.  This imaging did reveal possible signs of mycoplasma pneumonia versus bronchiolitis.  Patient's blood sugar improved with IV fluids and this was even with the patient drinking [facility] at the bedside while receiving fluids.  Patient was discharge however patient did not receive any prescriptions for antibiotics at discharge.  Prescriptions were sent to the patient's pharmacy and multiple attempts were made to contact the patient to let her know that the prescriptions will be able to be filled in the morning.  Was no response to the patient at the phone number she provided to registration on arrival.             --------------------------------------------- PAST HISTORY ---------------------------------------------  Past Medical History:  has a past medical history of Diabetes mellitus (HCC), HIV infection  (HCC), HTN (hypertension), Thyroid disease, Tobacco use disorder affecting pregnancy, antepartum, and Tobacco use disorder affecting pregnancy, antepartum.    Past Surgical History:  has a past surgical history that includes Thyroid surgery and Cholecystectomy.    Social History:  reports that she has been smoking cigarettes. She has been smoking about 0.50 packs per day. She has never used smokeless tobacco. She reports that she does not drink alcohol or use drugs.    Family History: family history is not on file.     The patient???s home medications have been reviewed.    Allergies: Sulfa antibiotics and Codeine    -------------------------------------------------- RESULTS -------------------------------------------------  Labs:  Results for orders placed or performed during the hospital encounter of 09/09/18   CBC auto differential   Result Value Ref Range    WBC 11.2 4.5 - 11.5 E9/L    RBC 4.17 3.50 - 5.50 E12/L    Hemoglobin 12.1 11.5 - 15.5 g/dL    Hematocrit 16.1 09.6 - 48.0 %    MCV 89.0 80.0 - 99.9 fL    MCH 29.0 26.0 - 35.0 pg    MCHC 32.6 32.0 - 34.5 %    RDW 11.7 11.5 - 15.0 fL    Platelets 533 (H) 130 - 450 E9/L    MPV 9.6 7.0 - 12.0 fL    Neutrophils % 74.2 43.0 - 80.0 %    Immature Granulocytes % 0.7 0.0 - 5.0 %    Lymphocytes % 17.1 (L) 20.0 - 42.0 %    Monocytes % 7.1 2.0 - 12.0 %    Eosinophils % 0.7 0.0 - 6.0 %    Basophils % 0.2 0.0 - 2.0 %    Neutrophils Absolute 8.31 (H) 1.80 - 7.30 E9/L    Immature Granulocytes # 0.08 E9/L    Lymphocytes Absolute 1.92 1.50 - 4.00 E9/L    Monocytes Absolute 0.80 0.10 - 0.95 E9/L    Eosinophils Absolute 0.08 0.05 - 0.50 E9/L    Basophils Absolute 0.02 0.00 - 0.20 E9/L   Comprehensive metabolic panel   Result Value Ref Range    Sodium 130 (L) 132 - 146 mmol/L    Potassium 3.8 3.5 - 5.0 mmol/L    Chloride 95 (L) 98 - 107 mmol/L    CO2 24 22 - 29 mmol/L    Anion Gap 11 7 - 16 mmol/L    Glucose 574 (HH) 74 - 99 mg/dL    BUN 7 6 - 20 mg/dL    CREATININE 0.5 0.5 - 1.0  mg/dL    GFR Non-African American >  60 >=60 mL/min/1.73    GFR African American >60     Calcium 9.2 8.6 - 10.2 mg/dL    Total Protein 6.8 6.4 - 8.3 g/dL    Alb 3.8 3.5 - 5.2 g/dL    Total Bilirubin <4.6 0.0 - 1.2 mg/dL    Alkaline Phosphatase 158 (H) 35 - 104 U/L    ALT 10 0 - 32 U/L    AST 14 0 - 31 U/L   Lipase   Result Value Ref Range    Lipase 30 13 - 60 U/L   LACTIC ACID, PLASMA   Result Value Ref Range    Lactic Acid 1.4 0.5 - 2.2 mmol/L   URINALYSIS   Result Value Ref Range    Color, UA Straw Straw/Yellow    Clarity, UA Clear Clear    Glucose, Ur >=1000 (A) Negative mg/dL    Bilirubin Urine Negative Negative    Ketones, Urine Negative Negative mg/dL    Specific Gravity, UA <=1.005 1.005 - 1.030    Blood, Urine Negative Negative    pH, UA 6.5 5.0 - 9.0    Protein, UA Negative Negative mg/dL    Urobilinogen, Urine 0.2 <2.0 E.U./dL    Nitrite, Urine Negative Negative    Leukocyte Esterase, Urine Negative Negative   Brain Natriuretic Peptide   Result Value Ref Range    Pro-BNP 133 (H) 0 - 125 pg/mL   D-dimer, quantitative   Result Value Ref Range    D-Dimer, Quant 252 ng/mL DDU   POC Pregnancy Urine Qual   Result Value Ref Range    HCG, Urine, POC Negative Negative    Lot Number [id number]     Positive QC Pass/Fail Pass     Negative QC Pass/Fail Pass    POCT Glucose   Result Value Ref Range    Glucose 350 mg/dL    QC OK? ok    POCT Glucose   Result Value Ref Range    Meter Glucose 350 (H) 74 - 99 mg/dL   EKG 12 Lead   Result Value Ref Range    Ventricular Rate 86 BPM    Atrial Rate 86 BPM    P-R Interval 136 ms    QRS Duration 78 ms    Q-T Interval 388 ms    QTc Calculation (Bazett) 464 ms    P Axis 61 degrees    R Axis 45 degrees    T Axis 42 degrees       Radiology:  CTA PULMONARY W CONTRAST   Final Result      1. No pulmonary embolism.      2. Branching and nodular (tree-in-bud) opacities are scattered in both   lung fields notable in the upper lobes, lingula, and right middle lobe   as well as lung bases.  Findings suggest atypical viral or mycoplasma   pneumonia versus infectious bronchiolitis.      3. Peribronchial inflammatory changes are present bilaterally.      4. Focal areas of groundglass opacity are present which may be   secondary to bronchiolitis versus developing pneumonia.      5. Emphysematous changes are present with expanded airspaces and   multiple blebs in upper lung fields.      6. Mediastinal and hilar lymphadenopathy.      7. Enlarged left lobe of the thyroid gland.      8. View of upper abdomen shows diverticulosis.      XR CHEST PORTABLE   Final  Result      Bilateral interstitial opacities notable in perihilar locations could   suggest peribronchial inflammatory changes. These findings have   increased compared to prior. No focal pneumonia or pleural effusion.          ------------------------- NURSING NOTES AND VITALS REVIEWED ---------------------------  Date / Time Roomed:  09/09/2018  8:18 PM  ED Bed Assignment:  OTF/OTF    The nursing notes within the ED encounter and vital signs as below have been reviewed.   BP (!) 142/91    Pulse 96    Temp 97.9 ??F (36.6 ??C) (Oral)    Resp 20    Ht 5\' 2"  (1.575 m)    Wt 135 lb (61.2 kg)    SpO2 94%    BMI 24.69 kg/m??   Oxygen Saturation Interpretation: Normal      ------------------------------------------ PROGRESS NOTES ------------------------------------------      I have spoken with the patient and discussed today???s results, in addition to providing specific details for the plan of care and counseling regarding the diagnosis and prognosis.  Their questions are answered at this time and they are agreeable with the plan. I discussed at length with them reasons for immediate return here for re evaluation. They will followup with their primary care physician by calling their office tomorrow.      --------------------------------- ADDITIONAL PROVIDER NOTES ---------------------------------  At this time the patient is without objective evidence of an acute  process requiring hospitalization or inpatient management.  They have remained hemodynamically stable throughout their entire ED visit and are stable for discharge with outpatient follow-up.     The plan has been discussed in detail and they are aware of the specific conditions for emergent return, as well as the importance of follow-up.      Discharge Medication List as of 09/10/2018  1:54 AM          Diagnosis:  1. Generalized weakness    2. Hyperglycemia    3. Viral syndrome    4. Pneumonia due to organism        Disposition:  Patient's disposition: Discharge to home  Patient's condition is stable.         Winn Jock, DO  09/10/18 1512

## 2018-09-09 NOTE — ED Notes (Signed)
Went in to patients room at this time to do EKG and patient was drinking a large cup full of [facility]. I informed patient that she might not want to be drinking that due to her glucose level being 574. She stated "I dont really care honestly I've had a bad day and I am drinking it." Dr. Ferd Glassing made aware.      Leonidas Romberg, RN  09/09/18 [phone]

## 2018-09-09 NOTE — ED Notes (Signed)
I contacted and spoke with the patient at approximately 3:15 PM to get a hold of Maria Valdez and updated her that I have sent prescriptions to [facility] in [place] for treatment of pneumonia as well as cough suppressant.  Patient states understanding and will go pick them up this afternoon.     Winn Jock, DO  09/10/18 1520

## 2018-09-10 LAB — COMPREHENSIVE METABOLIC PANEL
ALT: 10 U/L (ref 0–32)
AST: 14 U/L (ref 0–31)
Albumin: 3.8 g/dL (ref 3.5–5.2)
Alkaline Phosphatase: 158 U/L — ABNORMAL HIGH (ref 35–104)
Anion Gap: 11 mmol/L (ref 7–16)
BUN: 7 mg/dL (ref 6–20)
CO2: 24 mmol/L (ref 22–29)
Calcium: 9.2 mg/dL (ref 8.6–10.2)
Chloride: 95 mmol/L — ABNORMAL LOW (ref 98–107)
Creatinine: 0.5 mg/dL (ref 0.5–1.0)
GFR African American: >60
GFR Non-African American: >60 mL/min/{1.73_m2} (ref >=60)
Glucose: 574 mg/dL (ref 74–99)
Potassium: 3.8 mmol/L (ref 3.5–5.0)
Sodium: 130 mmol/L — ABNORMAL LOW (ref 132–146)
Total Bilirubin: <0.2 mg/dL (ref 0.0–1.2)
Total Protein: 6.8 g/dL (ref 6.4–8.3)

## 2018-09-10 LAB — EKG 12-LEAD
Atrial Rate: 86 {beats}/min
P Axis: 61 degrees
P-R Interval: 136 ms
Q-T Interval: 388 ms
QRS Duration: 78 ms
QTc Calculation (Bazett): 464 ms
R Axis: 45 degrees
T Axis: 42 degrees
Ventricular Rate: 86 {beats}/min

## 2018-09-10 LAB — URINALYSIS
Bilirubin Urine: NEGATIVE
Blood, Urine: NEGATIVE
Glucose, Ur: >=1000 mg/dL — AB
Ketones, Urine: NEGATIVE mg/dL
Leukocyte Esterase, Urine: NEGATIVE
Nitrite, Urine: NEGATIVE
Protein, UA: NEGATIVE mg/dL
Specific Gravity, UA: <=1.005 (ref 1.005–1.030)
Urobilinogen, Urine: 0.2 E.U./dL (ref <2.0)
pH, UA: 6.5 (ref 5.0–9.0)

## 2018-09-10 LAB — CBC WITH AUTO DIFFERENTIAL
Basophils %: 0.2 % (ref 0.0–2.0)
Basophils Absolute: 0.02 E9/L (ref 0.00–0.20)
Eosinophils %: 0.7 % (ref 0.0–6.0)
Eosinophils Absolute: 0.08 E9/L (ref 0.05–0.50)
Hematocrit: 37.1 % (ref 34.0–48.0)
Hemoglobin: 12.1 g/dL (ref 11.5–15.5)
Immature Granulocytes #: 0.08 E9/L
Immature Granulocytes %: 0.7 % (ref 0.0–5.0)
Lymphocytes %: 17.1 % — ABNORMAL LOW (ref 20.0–42.0)
Lymphocytes Absolute: 1.92 E9/L (ref 1.50–4.00)
MCH: 29 pg (ref 26.0–35.0)
MCHC: 32.6 % (ref 32.0–34.5)
MCV: 89 fL (ref 80.0–99.9)
MPV: 9.6 fL (ref 7.0–12.0)
Monocytes %: 7.1 % (ref 2.0–12.0)
Monocytes Absolute: 0.8 E9/L (ref 0.10–0.95)
Neutrophils %: 74.2 % (ref 43.0–80.0)
Neutrophils Absolute: 8.31 E9/L — ABNORMAL HIGH (ref 1.80–7.30)
Platelets: 533 E9/L — ABNORMAL HIGH (ref 130–450)
RBC: 4.17 E12/L (ref 3.50–5.50)
RDW: 11.7 fL (ref 11.5–15.0)
WBC: 11.2 E9/L (ref 4.5–11.5)

## 2018-09-10 LAB — POC PREGNANCY UR-QUAL
HCG, Urine, POC: NEGATIVE
Lot Number: 9050129

## 2018-09-10 LAB — POCT GLUCOSE
Glucose: 350 mg/dL
Meter Glucose: 350 mg/dL — ABNORMAL HIGH (ref 74–99)

## 2018-09-10 LAB — D-DIMER, QUANTITATIVE: D-Dimer, Quant: 252 ng/mL DDU

## 2018-09-10 LAB — LIPASE: Lipase: 30 U/L (ref 13–60)

## 2018-09-10 LAB — BRAIN NATRIURETIC PEPTIDE: Pro-BNP: 133 pg/mL — ABNORMAL HIGH (ref 0–125)

## 2018-09-10 LAB — LACTIC ACID: Lactic Acid: 1.4 mmol/L (ref 0.5–2.2)

## 2018-09-10 MED ORDER — IPRATROPIUM-ALBUTEROL 0.5-2.5 (3) MG/3ML IN SOLN
Freq: Once | RESPIRATORY_TRACT | Status: AC
Start: 2018-09-10 — End: 2018-09-09
  Administered 2018-09-10: 03:00:00 3 via RESPIRATORY_TRACT

## 2018-09-10 MED ORDER — SODIUM CHLORIDE 0.9 % IV BOLUS
0.9 % | Freq: Once | INTRAVENOUS | Status: AC
Start: 2018-09-10 — End: 2018-09-10
  Administered 2018-09-10: 03:00:00 1000 mL via INTRAVENOUS

## 2018-09-10 MED ORDER — IOPAMIDOL 76 % IV SOLN
76 % | Freq: Once | INTRAVENOUS | Status: AC | PRN
Start: 2018-09-10 — End: 2018-09-09
  Administered 2018-09-10: 03:00:00 60 mL via INTRAVENOUS

## 2018-09-10 NOTE — ED Notes (Signed)
Attempted to call patient at this time for the second time, patient given number again to call back.      Leonidas Romberg, RN  09/10/18 [phone]

## 2018-09-10 NOTE — ED Notes (Signed)
Attempted to call patient at this time for Dr. Ferd Glassing to inform her she needs prescriptions called in. Called patients cell phone and she did not answer but left a brief message for her to call us back and left phone number.      Leonidas Romberg, RN  09/10/18 [phone]

## 2019-07-05 ENCOUNTER — Encounter: Payer: Self-pay | Admitting: Medical-Surgical

## 2019-07-05 ENCOUNTER — Other Ambulatory Visit: Payer: Self-pay

## 2019-07-05 ENCOUNTER — Ambulatory Visit (INDEPENDENT_AMBULATORY_CARE_PROVIDER_SITE_OTHER): Payer: Self-pay | Admitting: Medical-Surgical

## 2019-07-05 VITALS — BP 139/79 | HR 74 | Temp 98.1°F | Ht 65.5 in | Wt 325.1 lb

## 2019-07-05 DIAGNOSIS — L03031 Cellulitis of right toe: Secondary | ICD-10-CM | POA: Insufficient documentation

## 2019-07-05 MED ORDER — CHLORHEXIDINE GLUCONATE 4 % EX LIQD: Freq: Every day | CUTANEOUS | 0 refills | Status: AC | PRN

## 2019-07-05 MED ORDER — DOXYCYCLINE HYCLATE 100 MG PO TABS: 100.0000 mg | ORAL_TABLET | Freq: Two times a day (BID) | ORAL | 0 refills | Status: AC

## 2019-07-05 NOTE — Progress Notes (Signed)
Subjective:    CC: Infected toenail  HPI: Pleasant 53 year old female presenting with c/o right great toenail infection with severe pain and tenderness to light palpation. Started around Thanksgiving and gradually worsened despite Epson salt soaks, Betadine application, Neosporin, peroxide, and self-attempt at drainage. Reports yellowish drainage from distal lateral corner of toenail.   I reviewed the past medical history, family history, social history, surgical history, and allergies today and no changes were needed.  Please see the problem list section below in epic for further details.  Past Medical History: Past Medical History:  Diagnosis Date  . Allergy   . GERD (gastroesophageal reflux disease)   . History of gallstones 01/20/2013  . Skin cancer    Past Surgical History: Past Surgical History:  Procedure Laterality Date  . CHOLECYSTECTOMY     Social History: Social History   Socioeconomic History  . Marital status: Single    Spouse name: Not on file  . Number of children: Not on file  . Years of education: Not on file  . Highest education level: Not on file  Occupational History  . Not on file  Tobacco Use  . Smoking status: Never Smoker  . Smokeless tobacco: Never Used  Substance and Sexual Activity  . Alcohol use: Yes    Alcohol/week: 1.0 standard drinks    Types: 1 Standard drinks or equivalent per week  . Drug use: No  . Sexual activity: Not Currently    Birth control/protection: None  Other Topics Concern  . Not on file  Social History Narrative  . Not on file   Social Determinants of Health   Financial Resource Strain:   . Difficulty of Paying Living Expenses: Not on file  Food Insecurity:   . Worried About Charity fundraiser in the Last Year: Not on file  . Ran Out of Food in the Last Year: Not on file  Transportation Needs:   . Lack of Transportation (Medical): Not on file  . Lack of Transportation (Non-Medical): Not on file  Physical  Activity:   . Days of Exercise per Week: Not on file  . Minutes of Exercise per Session: Not on file  Stress:   . Feeling of Stress : Not on file  Social Connections:   . Frequency of Communication with Friends and Family: Not on file  . Frequency of Social Gatherings with Friends and Family: Not on file  . Attends Religious Services: Not on file  . Active Member of Clubs or Organizations: Not on file  . Attends Archivist Meetings: Not on file  . Marital Status: Not on file   Family History: Family History  Problem Relation Age of Onset  . Thyroid disease Mother   . Thyroid disease Sister   . Heart disease Father   . Sudden Cardiac Death Brother    Allergies: Allergies  Allergen Reactions  . Asa [Aspirin]   . Codeine    Medications: See med rec.  Review of Systems: No fevers, chills, night sweats, weight loss, chest pain, or shortness of breath.   Objective:    General: Well Developed, well nourished, and in no acute distress.  Neuro: Alert and oriented x3, sensation grossly intact.  HEENT: Normocephalic, atraumatic.  Skin: Warm and dry, no rashes. Right great toe redness and edema to lateral side of nail and end of toe. Tissue at lateral distal corner of nail friable, minimal serous drainage noted with manual expression.  Cardiac: Regular rate.   Respiratory:  Not using accessory muscles, speaking in full sentences.   Impression and Recommendations:    Paronychia of great toe, right No area of fluctuance at this time to necessitate I&D or toenail removal. Hibiclens soaks BID with 7 days Doxycycline. Scheduled for office visit on Wednesday 07/07/19 for recheck and possible I&D vs. toenail removal. If improved by then, can cancel appointment.   Return in about 2 days (around 07/07/2019) for Recheck of infected toenail.   ___________________________________________ Clearnce Sorrel, DNP, APRN, FNP-BC [facility] [place]

## 2019-07-05 NOTE — Patient Instructions (Signed)
Hibiclens soak- add 61ml to warm/hot water and soak right great toe for 10-20 minutes two times daily.  Take oral antibiotics as prescribed.   If your toe improves, please call the office Wednesday morning to cancel the appointment for follow up.   Paronychia Paronychia is an infection of the skin. It happens near a fingernail or toenail. It may cause pain and swelling around the nail. In some cases, a fluid-filled bump (abscess) can form near or under the nail. Usually, this condition is not serious, and it clears up with treatment. Follow these instructions at home: Wound care  Keep the affected area clean.  Soak the fingers or toes in warm water as told by your doctor. You may be told to do this for 20 minutes, 2-3 times a day.  Keep the area dry when you are not soaking it.  Do not try to drain a fluid-filled bump on your own.  Follow instructions from your doctor about how to take care of the affected area. Make sure you: ? Wash your hands with soap and water before you change your bandage (dressing). If you cannot use soap and water, use hand sanitizer. ? Change your bandage as told by your doctor.  If you had a fluid-filled bump and your doctor drained it, check the area every day for signs of infection. Check for: ? Redness, swelling, or pain. ? Fluid or blood. ? Warmth. ? Pus or a bad smell. Medicines   Take over-the-counter and prescription medicines only as told by your doctor.  If you were prescribed an antibiotic medicine, take it as told by your doctor. Do not stop taking it even if you start to feel better. General instructions  Avoid touching any chemicals.  Do not pick at the affected area. Prevention  To prevent this condition from happening again: ? Wear rubber gloves when putting your hands in water for washing dishes or other tasks. ? Wear gloves if your hands might touch cleaners or chemicals. ? Avoid injuring your nails or fingertips. ? Do not  bite your nails or tear hangnails. ? Do not cut your nails very short. ? Do not cut the skin at the base and sides of the nail (cuticles). ? Use clean nail clippers or scissors when trimming nails. Contact a doctor if:  You feel worse.  You do not get better.  You have more fluid, blood, or pus coming from the affected area.  Your finger or knuckle is swollen or is hard to move. Get help right away if you have:  A fever or chills.  Redness spreading from the affected area.  Pain in a joint or muscle. Summary  Paronychia is an infection of the skin. It happens near a fingernail or toenail.  This condition may cause pain and swelling around the nail.  Soak the fingers or toes in warm water as told by your doctor.  Usually, this condition is not serious, and it clears up with treatment. This information is not intended to replace advice given to you by your health care provider. Make sure you discuss any questions you have with your health care provider. Document Released: 06/19/2009 Document Revised: 07/18/2017 Document Reviewed: 07/14/2017 Elsevier Patient Education  2020 [employer].

## 2019-07-05 NOTE — Assessment & Plan Note (Addendum)
No area of fluctuance at this time to necessitate I&D or toenail removal. Hibiclens soaks BID with 7 days Doxycycline. Schedule for office visit on Wednesday 07/07/19 for recheck and possible I&D vs. toenail removal. If improved by then, can cancel appointment.

## 2019-07-07 ENCOUNTER — Ambulatory Visit: Payer: Self-pay | Admitting: Medical-Surgical

## 2022-02-06 LAB — HM PAP SMEAR: PAP Smear, External: NEGATIVE

## 2022-02-13 LAB — HM MAMMOGRAPHY

## 2022-12-27 LAB — HM PAP SMEAR: PAP Smear, External: NEGATIVE

## 2023-02-14 LAB — HM MAMMOGRAPHY

## 2023-05-07 ENCOUNTER — Emergency Department: Admit: 2023-05-07 | Payer: PRIVATE HEALTH INSURANCE | Primary: Family Medicine

## 2023-05-07 ENCOUNTER — Inpatient Hospital Stay: Admit: 2023-05-07 | Payer: PRIVATE HEALTH INSURANCE | Primary: Family Medicine

## 2023-05-07 ENCOUNTER — Inpatient Hospital Stay
Admission: EM | Admit: 2023-05-07 | Discharge: 2023-05-10 | Disposition: A | Discharge status: Home / Self Care | Payer: PRIVATE HEALTH INSURANCE | Admitting: Internal Medicine

## 2023-05-07 DIAGNOSIS — A419 Sepsis, unspecified organism: Secondary | ICD-10-CM

## 2023-05-07 DIAGNOSIS — N10 Acute tubulo-interstitial nephritis: Secondary | ICD-10-CM

## 2023-05-07 LAB — CBC WITH AUTO DIFFERENTIAL
Basophils %: 0 % (ref 0.0–2.0)
Basophils Absolute: 0.05 10*3/uL (ref 0.00–0.20)
Eosinophils %: 0 % (ref 0–6)
Eosinophils Absolute: 0 10*3/uL — ABNORMAL LOW (ref 0.05–0.50)
Hematocrit: 41.6 % (ref 34.0–48.0)
Hemoglobin: 14.2 g/dL (ref 11.5–15.5)
Immature Granulocytes %: 1 % (ref 0.0–5.0)
Immature Granulocytes Absolute: 0.16 10*3/uL (ref 0.00–0.58)
Lymphocytes %: 8 % — ABNORMAL LOW (ref 20.0–42.0)
Lymphocytes Absolute: 1.27 10*3/uL — ABNORMAL LOW (ref 1.50–4.00)
MCH: 29.2 pg (ref 26.0–35.0)
MCHC: 34.1 g/dL (ref 32.0–34.5)
MCV: 85.4 fL (ref 80.0–99.9)
MPV: 8.8 fL (ref 7.0–12.0)
Monocytes %: 3 % (ref 2.0–12.0)
Monocytes Absolute: 0.51 10*3/uL (ref 0.10–0.95)
Neutrophils %: 88 % — ABNORMAL HIGH (ref 43.0–80.0)
Neutrophils Absolute: 14.46 10*3/uL — ABNORMAL HIGH (ref 1.80–7.30)
Platelets: 639 10*3/uL — ABNORMAL HIGH (ref 130–450)
RBC: 4.87 m/uL (ref 3.50–5.50)
RDW: 12.9 % (ref 11.5–15.0)
WBC: 16.5 10*3/uL — ABNORMAL HIGH (ref 4.5–11.5)

## 2023-05-07 LAB — POCT GLUCOSE: POC Glucose: 327 mg/dL — ABNORMAL HIGH (ref 74–99)

## 2023-05-07 LAB — COMPREHENSIVE METABOLIC PANEL W/ REFLEX TO MG FOR LOW K
ALT: <5 U/L (ref 0–32)
AST: 10 U/L (ref 0–31)
Albumin: 3.7 g/dL (ref 3.5–5.2)
Alkaline Phosphatase: 204 U/L — ABNORMAL HIGH (ref 35–104)
Anion Gap: 15 mmol/L (ref 7–16)
BUN: 5 mg/dL — ABNORMAL LOW (ref 6–20)
CO2: 30 mmol/L — ABNORMAL HIGH (ref 22–29)
Calcium: 9 mg/dL (ref 8.6–10.2)
Chloride: 84 mmol/L — ABNORMAL LOW (ref 98–107)
Creatinine: 0.5 mg/dL (ref 0.50–1.00)
Est, Glom Filt Rate: >90 mL/min/{1.73_m2} (ref >60)
Glucose: 328 mg/dL — ABNORMAL HIGH (ref 74–99)
Potassium: 3 mmol/L — ABNORMAL LOW (ref 3.5–5.0)
Sodium: 129 mmol/L — ABNORMAL LOW (ref 132–146)
Total Bilirubin: 0.5 mg/dL (ref 0.0–1.2)
Total Protein: 7.6 g/dL (ref 6.4–8.3)

## 2023-05-07 LAB — URINALYSIS WITH MICROSCOPIC
Bilirubin, Urine: NEGATIVE
Epithelial Cells, UA: 0 /[HPF]
Glucose, Ur: >=1000 mg/dL — AB
Ketones, Urine: NEGATIVE mg/dL
Nitrite, Urine: NEGATIVE
Protein, UA: NEGATIVE mg/dL
RBC, UA: 6 /[HPF] — AB
Specific Gravity, UA: 1.01 (ref 1.005–1.030)
Urobilinogen, Urine: 0.2 U/dL (ref 0.0–1.0)
WBC, UA: 21 /[HPF] — AB
pH, Urine: 5.5 (ref 5.0–9.0)

## 2023-05-07 LAB — MAGNESIUM: Magnesium: 1 mg/dL — ABNORMAL LOW (ref 1.6–2.6)

## 2023-05-07 LAB — BETA-HYDROXYBUTYRATE: Beta-Hydroxybutyrate: 0.53 mmol/L — ABNORMAL HIGH (ref 0.02–0.27)

## 2023-05-07 LAB — PH, VENOUS: pH, Ven: 7.56 — ABNORMAL HIGH (ref 7.350–7.450)

## 2023-05-07 LAB — LIPASE: Lipase: 13 U/L (ref 13–60)

## 2023-05-07 LAB — TROPONIN: Troponin, High Sensitivity: 7 ng/L (ref 0–9)

## 2023-05-07 MED ORDER — PROCHLORPERAZINE MALEATE 10 MG PO TABS
10 MG | Freq: Three times a day (TID) | ORAL | Status: AC | PRN
Start: 2023-05-07 — End: 2023-05-10

## 2023-05-07 MED ORDER — METHIMAZOLE 5 MG PO TABS
5 | Freq: Every day | ORAL | Status: DC
Start: 2023-05-07 — End: 2023-05-07

## 2023-05-07 MED ORDER — NORMAL SALINE FLUSH 0.9 % IV SOLN
0.9 % | Freq: Two times a day (BID) | INTRAVENOUS | Status: AC
Start: 2023-05-07 — End: 2023-05-10
  Administered 2023-05-08 – 2023-05-10 (×6): 10 mL via INTRAVENOUS

## 2023-05-07 MED ORDER — DIPHENHYDRAMINE HCL 50 MG/ML IJ SOLN
50 | Freq: Once | INTRAMUSCULAR | Status: AC
Start: 2023-05-07 — End: 2023-05-07
  Administered 2023-05-07: 19:00:00 25 mg via INTRAVENOUS

## 2023-05-07 MED ORDER — MAGNESIUM SULFATE 4000 MG/100 ML IVPB PREMIX
4 | Freq: Once | INTRAVENOUS | Status: AC
Start: 2023-05-07 — End: 2023-05-07
  Administered 2023-05-07: 20:00:00 4000 mg via INTRAVENOUS

## 2023-05-07 MED ORDER — PROPRANOLOL HCL ER 60 MG PO CP24
60 MG | Freq: Every day | ORAL | Status: AC
Start: 2023-05-07 — End: 2023-05-10
  Administered 2023-05-08 – 2023-05-10 (×4): 120 mg via ORAL

## 2023-05-07 MED ORDER — STERILE WATER FOR INJECTION (MIXTURES ONLY)
1 g | INTRAMUSCULAR | Status: AC
Start: 2023-05-07 — End: 2023-05-10
  Administered 2023-05-08 – 2023-05-09 (×2): 1000 mg via INTRAVENOUS

## 2023-05-07 MED ORDER — ONDANSETRON HCL 4 MG/2ML IJ SOLN
4 | Freq: Four times a day (QID) | INTRAMUSCULAR | Status: DC | PRN
Start: 2023-05-07 — End: 2023-05-07

## 2023-05-07 MED ORDER — INSULIN LISPRO 100 UNIT/ML IJ SOLN
100 UNIT/ML | Freq: Four times a day (QID) | INTRAMUSCULAR | Status: AC
Start: 2023-05-07 — End: 2023-05-08
  Administered 2023-05-08: 16:00:00 2 [IU] via SUBCUTANEOUS
  Administered 2023-05-08 (×2): 4 [IU] via SUBCUTANEOUS

## 2023-05-07 MED ORDER — ACETAMINOPHEN 650 MG RE SUPP
650 | Freq: Four times a day (QID) | RECTAL | Status: DC | PRN
Start: 2023-05-07 — End: 2023-05-10

## 2023-05-07 MED ORDER — NORMAL SALINE FLUSH 0.9 % IV SOLN
0.9 % | INTRAVENOUS | Status: AC | PRN
Start: 2023-05-07 — End: 2023-05-10

## 2023-05-07 MED ORDER — POTASSIUM CHLORIDE CRYS ER 20 MEQ PO TBCR
20 | Freq: Once | ORAL | Status: AC
Start: 2023-05-07 — End: 2023-05-07
  Administered 2023-05-07: 20:00:00 40 meq via ORAL

## 2023-05-07 MED ORDER — PROCHLORPERAZINE EDISYLATE 10 MG/2ML IJ SOLN
102 MG/2ML | Freq: Four times a day (QID) | INTRAMUSCULAR | Status: AC | PRN
Start: 2023-05-07 — End: 2023-05-10

## 2023-05-07 MED ORDER — STERILE WATER FOR INJECTION (MIXTURES ONLY)
2 | Freq: Once | INTRAMUSCULAR | Status: AC
Start: 2023-05-07 — End: 2023-05-07
  Administered 2023-05-07: 22:00:00 2000 mg via INTRAVENOUS

## 2023-05-07 MED ORDER — SODIUM CHLORIDE 0.9 % IV SOLN
0.9 % | INTRAVENOUS | Status: AC | PRN
Start: 2023-05-07 — End: 2023-05-10

## 2023-05-07 MED ORDER — INSULIN LISPRO 100 UNIT/ML IJ SOLN
100 | Freq: Four times a day (QID) | INTRAMUSCULAR | Status: DC
Start: 2023-05-07 — End: 2023-05-07

## 2023-05-07 MED ORDER — IOPAMIDOL 76 % IV SOLN
76 | Freq: Once | INTRAVENOUS | Status: AC | PRN
Start: 2023-05-07 — End: 2023-05-07
  Administered 2023-05-07: 19:00:00 75 mL via INTRAVENOUS

## 2023-05-07 MED ORDER — ONDANSETRON 4 MG PO TBDP
4 | Freq: Three times a day (TID) | ORAL | Status: DC | PRN
Start: 2023-05-07 — End: 2023-05-07

## 2023-05-07 MED ORDER — KETOROLAC TROMETHAMINE 15 MG/ML IJ SOLN
15 | Freq: Once | INTRAMUSCULAR | Status: AC
Start: 2023-05-07 — End: 2023-05-07
  Administered 2023-05-07: 17:00:00 15 mg via INTRAVENOUS

## 2023-05-07 MED ORDER — ONDANSETRON HCL 4 MG/2ML IJ SOLN
4 | Freq: Once | INTRAMUSCULAR | Status: AC
Start: 2023-05-07 — End: 2023-05-07
  Administered 2023-05-07: 17:00:00 4 mg via INTRAVENOUS

## 2023-05-07 MED ORDER — NICOTINE 14 MG/24HR TD PT24
14 | Freq: Every day | TRANSDERMAL | Status: DC
Start: 2023-05-07 — End: 2023-05-10
  Administered 2023-05-07 – 2023-05-10 (×4): 1 via TRANSDERMAL

## 2023-05-07 MED ORDER — ENOXAPARIN SODIUM 30 MG/0.3ML IJ SOSY
300.3 MG/0.3ML | Freq: Every day | INTRAMUSCULAR | Status: AC
Start: 2023-05-07 — End: 2023-05-10
  Administered 2023-05-08 – 2023-05-10 (×4): 30 mg via SUBCUTANEOUS

## 2023-05-07 MED ORDER — SODIUM CHLORIDE 0.9 % IV BOLUS
0.9 | Freq: Once | INTRAVENOUS | Status: AC
Start: 2023-05-07 — End: 2023-05-07
  Administered 2023-05-07: 19:00:00 1000 mL via INTRAVENOUS

## 2023-05-07 MED ORDER — POLYETHYLENE GLYCOL 3350 17 G PO PACK
17 g | Freq: Every day | ORAL | Status: AC | PRN
Start: 2023-05-07 — End: 2023-05-10

## 2023-05-07 MED ORDER — METOCLOPRAMIDE HCL 5 MG/ML IJ SOLN
5 | Freq: Once | INTRAMUSCULAR | Status: AC
Start: 2023-05-07 — End: 2023-05-07
  Administered 2023-05-07: 19:00:00 10 mg via INTRAVENOUS

## 2023-05-07 MED ORDER — INSULIN GLARGINE 100 UNIT/ML SC SOLN
100 UNIT/ML | Freq: Every day | SUBCUTANEOUS | Status: AC
Start: 2023-05-07 — End: 2023-05-10
  Administered 2023-05-08 – 2023-05-10 (×4): 20 [IU] via SUBCUTANEOUS

## 2023-05-07 MED ORDER — POTASSIUM CHLORIDE 10 MEQ/100ML IV SOLN
10 | Freq: Once | INTRAVENOUS | Status: AC
Start: 2023-05-07 — End: 2023-05-07
  Administered 2023-05-07: 19:00:00 10 meq via INTRAVENOUS

## 2023-05-07 MED ORDER — ACETAMINOPHEN 325 MG PO TABS
325 | Freq: Four times a day (QID) | ORAL | Status: DC | PRN
Start: 2023-05-07 — End: 2023-05-10
  Administered 2023-05-08 – 2023-05-10 (×5): 650 mg via ORAL

## 2023-05-07 MED FILL — DIPHENHYDRAMINE HCL 50 MG/ML IJ SOLN: 50 MG/ML | INTRAMUSCULAR | Qty: 1

## 2023-05-07 MED FILL — MAGNESIUM SULFATE 4 GM/100ML IV SOLN: 4 GM/100ML | INTRAVENOUS | Qty: 100

## 2023-05-07 MED FILL — KETOROLAC TROMETHAMINE 15 MG/ML IJ SOLN: 15 mg/mL | INTRAMUSCULAR | Qty: 1

## 2023-05-07 MED FILL — ONDANSETRON HCL 4 MG/2ML IJ SOLN: 4 MG/2ML | INTRAMUSCULAR | Qty: 2

## 2023-05-07 MED FILL — NICOTINE 14 MG/24HR TD PT24: 14 MG/24HR | TRANSDERMAL | Qty: 1

## 2023-05-07 MED FILL — POTASSIUM CHLORIDE 10 MEQ/100ML IV SOLN: 10 MEQ/0ML | INTRAVENOUS | Qty: 100

## 2023-05-07 MED FILL — CEFTRIAXONE SODIUM 2 G IJ SOLR: 2 g | INTRAMUSCULAR | Qty: 2000

## 2023-05-07 MED FILL — POTASSIUM CHLORIDE CRYS ER 20 MEQ PO TBCR: 20 MEQ | ORAL | Qty: 2

## 2023-05-07 MED FILL — METOCLOPRAMIDE HCL 5 MG/ML IJ SOLN: 5 MG/ML | INTRAMUSCULAR | Qty: 2

## 2023-05-07 NOTE — ED Notes (Signed)
Report to Katie RN, care of patient relinquished.

## 2023-05-07 NOTE — ED Notes (Signed)
Pt placed on beside cardiac monitor at this time

## 2023-05-07 NOTE — ED Notes (Signed)
ED to Inpatient Handoff Report    Notified shawn that electronic handoff available and patient ready for transport to room 538.    Safety Risks: None identified    Patient in Restraints: no    Constant Observer or Patient Safety Companion: no    Telemetry Monitoring Ordered :Yes           Order to transfer to unit without monitor:YES    Last MEWS: 1 Time completed: 1914    Deterioration Index Score:   Predictive Model Details          25 (Normal)  Factor Value    Calculated 05/07/2023 19:51 63% Age 57 years old    Deterioration Index Model 15% WBC count abnormal (16.5 k/uL)     11% Blood pH abnormal (7.560)     10% Respiratory rate 18     7% Sodium abnormal (129 mmol/L)     6% Pulse 97     6% Potassium abnormal (3.0 mmol/L)     4% BUN abnormal (5 mg/dL)     3% Systolic 376     2% Platelet count abnormal (639 k/uL)     0% Hematocrit 41.6 %     0% Pulse oximetry 96 %     0% Temperature 98.5 F (36.9 C)        Vitals:    05/07/23 0956 05/07/23 1534 05/07/23 1607 05/07/23 1914   BP: (!) 140/109 133/84 118/75 122/82   Pulse: 91 98 98 97   Resp: 18 16 23 18    Temp: 98.2 F (36.8 C)   98.5 F (36.9 C)   TempSrc: Oral   Oral   SpO2: 100% 98% 97% 96%   Weight: 44.3 kg (97 lb 9.6 oz)      Height: 1.575 m (5\' 2" )            Opportunity for questions and clarification was provided.

## 2023-05-07 NOTE — ED Provider Notes (Incomplete)
[facility] EMERGENCY DEPARTMENT  EMERGENCY DEPARTMENT ENCOUNTER        Pt Name: Maria Valdez  MRN: [id number]  Birthdate 02-Oct-1965  Date of evaluation: 05/07/2023  Provider: Laverda Sorenson, MD  PCP: Clovis Pu, DO  Note Started: 11:20 AM EDT 05/07/23    CHIEF COMPLAINT       Chief Complaint   Patient presents with    Vomiting     Ongoing since August, treated for 2 UTI's. Has back pain as well. States excessive weight loss over the last 2 months.       HISTORY OF PRESENT ILLNESS: 1 or more Elements   History From: Patient    Limitations to history : None    Maria Valdez is a 57 y.o. female who presents intermittent nausea and vomiting ongoing for a few months. Patient states that she has had episodes of vomiting since August.  Associated lower abdominal pain. Patient has a history of uncontrolled diabetes and [place].  She states her last A1c was elevated.  Patient states that she has had multiple episodes of vomiting this morning.  Presents for further evaluation.  Patient notes that she has had weight loss of 30 lbs over the last year as well.  Patient complains of feeling very thirsty.  Patient also complains of bilateral flank pain.  Associated chills.  Patient is a smoker.  Denies any alcohol or illicit drug use.    Patient denies fever, headache, shortness of breath, abdominal pain, diarrhea, lightheadedness, dysuria, hematuria, hematochezia, and melena.    Nursing Notes were all reviewed and agreed with or any disagreements were addressed in the HPI.        REVIEW OF EXTERNAL NOTES :           REVIEW OF SYSTEMS :         Positives and Pertinent negatives as per HPI.     SURGICAL HISTORY     Past Surgical History:   Procedure Laterality Date    CHOLECYSTECTOMY      THYROID SURGERY         CURRENTMEDICATIONS       Previous Medications    DICYCLOMINE (BENTYL) 10 MG CAPSULE    Take 2 capsules by mouth 4 times daily  (before meals and nightly)    ELVITEG-COBIC-EMTRICIT-TENOFAF (GENVOYA) 150-150-200-10 MG TABS    Take 1 tablet by mouth daily    FAMOTIDINE (PEPCID) 20 MG TABLET    Take 1 tablet by mouth 2 times daily for 7 days    INSULIN GLARGINE (LANTUS) 100 UNIT/ML INJECTION VIAL    Inject 36 Units into the skin daily    METFORMIN (GLUCOPHAGE) 1000 MG TABLET    Take 1,000 mg by mouth    METHIMAZOLE (TAPAZOLE) 10 MG TABLET    Take 15 mg by mouth daily    PROPRANOLOL (INDERAL LA) 120 MG EXTENDED RELEASE CAPSULE    Take 120 mg by mouth daily       ALLERGIES     Sulfa antibiotics and Codeine    FAMILYHISTORY     No family history on file.     SOCIAL HISTORY       Social History     Tobacco Use    Smoking status: Every Day     Current packs/day: 0.50     Types: Cigarettes    Smokeless tobacco: Never   Substance Use Topics    Alcohol use: No  Drug use: No       SCREENINGS        Glasgow Coma Scale  Eye Opening: Spontaneous  Best Verbal Response: Oriented  Best Motor Response: Obeys commands  Glasgow Coma Scale Score: 15                CIWA Assessment  BP: 118/75  Pulse: 98           PHYSICAL EXAM  1 or more Elements     ED Triage Vitals [05/07/23 0956]   BP Systolic BP Percentile Diastolic BP Percentile Temp Temp Source Pulse Respirations SpO2   (!) 140/109 -- -- 98.2 F (36.8 C) Oral 91 18 100 %      Height Weight - Scale         1.575 m (5\' 2" ) 44.3 kg (97 lb 9.6 oz)                    Constitutional/General: Alert and oriented x3  Head: Normocephalic and atraumatic  Eyes: PERRL, EOMI, conjunctiva normal, sclera non icteric  ENT:  Oropharynx clear, handling secretions, no trismus, no asymmetry of the posterior oropharynx or uvular edema.  Dry mucous membranes.   Neck: Supple, full ROM, no stridor, no meningeal signs  Respiratory: Lungs clear to auscultation bilaterally, no wheezes, rales, or rhonchi. Not in respiratory distress  Cardiovascular:  Regular rate. Regular rhythm. Equal extremity pulses.   GI:  Abdomen Soft, mild  lower abdominal tenderness, Non distended. No rebound, guarding, or rigidity.  Bilateral CVA tenderness.   Musculoskeletal: Moves all extremities x 4. Warm and well perfused, no clubbing, no cyanosis, no edema. Capillary refill <3 seconds  Integument: skin warm and dry. No rashes.   Neurologic: no focal deficits, CN II-XII grossly intact. Symmetric strength 5/5 in the upper and lower extremities bilaterally  Psychiatric: Normal Affect            DIAGNOSTIC RESULTS   LABS:    Labs Reviewed   CBC WITH AUTO DIFFERENTIAL - Abnormal; Notable for the following components:       Result Value    WBC 16.5 (*)     Platelets 639 (*)     Neutrophils % 88 (*)     Lymphocytes % 8 (*)     Neutrophils Absolute 14.46 (*)     Lymphocytes Absolute 1.27 (*)     Eosinophils Absolute 0.00 (*)     All other components within normal limits   COMPREHENSIVE METABOLIC PANEL W/ REFLEX TO MG FOR LOW K - Abnormal; Notable for the following components:    Sodium 129 (*)     Potassium 3.0 (*)     Chloride 84 (*)     CO2 30 (*)     Glucose 328 (*)     BUN 5 (*)     Alkaline Phosphatase 204 (*)     All other components within normal limits   URINALYSIS WITH MICROSCOPIC - Abnormal; Notable for the following components:    Glucose, Ur >=1000 (*)     Urine Hgb LARGE (*)     Leukocyte Esterase, Urine SMALL (*)     WBC, UA 21 TO 50 (*)     RBC, UA 6 TO 9 (*)     Bacteria, UA 3+ (*)     All other components within normal limits   PH, VENOUS - Abnormal; Notable for the following components:    pH, Ven 7.560 (*)     All  other components within normal limits   BETA-HYDROXYBUTYRATE - Abnormal; Notable for the following components:    Beta-Hydroxybutyrate 0.53 (*)     All other components within normal limits   MAGNESIUM - Abnormal; Notable for the following components:    Magnesium 1.0 (*)     All other components within normal limits   POCT GLUCOSE - Abnormal; Notable for the following components:    POC Glucose 327 (*)     All other components within normal  limits   LIPASE   TROPONIN       As interpreted by me, the above displayed labs are abnormal. All other labs obtained during this visit were within normal range or not returned as of this dictation.      EKG Interpretation  Interpreted by emergency department physician, Laverda Sorenson, MD      Sinus tachycardia with rate of 108 bpm.  Normal axis.  QTc 533.  No STEMI criteria.        RADIOLOGY:   Non-plain film images such as CT, Ultrasound and MRI are read by the radiologist. Plain radiographic images are visualized and preliminarily interpreted by the ED Provider with the below findings:    CXR shows no obvious consolidation, pneumothorax, pleural effusion    Interpretation per the Radiologist below, if available at the time of this note:    CT ABDOMEN PELVIS W IV CONTRAST Additional Contrast? None   Final Result   1. Mild intrahepatic and extrahepatic ductal dilatation in post   cholecystectomy patient.   2. Small hiatal hernia.   3. Partially fluid filled normal caliber small bowel loops.  This finding is   nonspecific, but may be seen in enteritis in the appropriate clinical setting         XR CHEST PORTABLE   Final Result   No acute cardiopulmonary disease.           No results found.    No results found.    PROCEDURES   Unless otherwise noted below, none          PAST MEDICAL HISTORY/Chronic Conditions Affecting Care      has a past medical history of Diabetes mellitus (HCC), HIV infection (HCC), HTN (hypertension) (08/19/2011), Thyroid disease, Tobacco use disorder affecting pregnancy, antepartum, and Tobacco use disorder affecting pregnancy, antepartum.     EMERGENCY DEPARTMENT COURSE    Vitals:    Vitals:    05/07/23 0956 05/07/23 1534 05/07/23 1607   BP: (!) 140/109 133/84 118/75   Pulse: 91 98 98   Resp: 18 16 23    Temp: 98.2 F (36.8 C)     TempSrc: Oral     SpO2: 100% 98% 97%   Weight: 44.3 kg (97 lb 9.6 oz)     Height: 1.575 m (5\' 2" )         Patient was given the following medications:  Medications    magnesium sulfate 4000 mg in 100 mL IVPB premix (4,000 mg IntraVENous New Bag 05/07/23 1605)   cefTRIAXone (ROCEPHIN) 2,000 mg in sterile water 20 mL IV syringe (has no administration in time range)   nicotine (NICODERM CQ) 14 MG/24HR 1 patch (has no administration in time range)   ondansetron (ZOFRAN) injection 4 mg (4 mg IntraVENous Given 05/07/23 1237)   ketorolac (TORADOL) injection 15 mg (15 mg IntraVENous Given 05/07/23 1237)   potassium chloride (KLOR-CON M) extended release tablet 40 mEq (40 mEq Oral Given 05/07/23 1533)   potassium chloride 10 mEq/100 mL IVPB (  Peripheral Line) (0 mEq IntraVENous Stopped 05/07/23 1602)   metoclopramide (REGLAN) injection 10 mg (10 mg IntraVENous Given 05/07/23 1435)   sodium chloride 0.9 % bolus 1,000 mL (0 mLs IntraVENous Stopped 05/07/23 1539)   iopamidol (ISOVUE-370) 76 % injection 75 mL (75 mLs IntraVENous Given 05/07/23 1512)   diphenhydrAMINE (BENADRYL) injection 25 mg (25 mg IntraVENous Given 05/07/23 1452)             Medical Decision Making/Differential Diagnosis:    CC/HPI Summary, Social Determinants of health, Records Reviewed, DDx, testing done/not done, ED Course, Reassessment, disposition considerations/shared decision making with patient, consults, disposition:      ED Course as of 05/07/23 1705   Wed May 07, 2023   1432 EKG:  This EKG is signed and interpreted by the EP.    Time: 14:24  Rate: 108  Rhythm: Sinus  Interpretation: prolonged QT interval and sinus tachycardia  Comparison: stable as compared to patient's most recent EKG   [CF]   1659 Spoke with Dr. Georgeann Oppenheim (IM). Discussed case. She will admit pt.  [CM]      ED Course User Index  [CF] Winn Jock, DO  [CM] Laverda Sorenson, MD        This is a 57 year old female with uncontrolled diabetes mellitus, HTN, HIV, thyroid disease who presents to the ED for intermittent ongoing nausea and vomiting.  Patient also reports extensive weight loss over the last few months.  Differential diagnosis  includes DKA, gastroparesis, gastroenteritis, malignancy, electrolyte abnormality, to name a few.  Patient is hemodynamically stable.  Dry mucous membranes appreciated.  Abdomen is soft, nondistended.  Lower abdominal tenderness to palpation appreciated.  No rebound or guarding.  Bilateral CVA tenderness.  Patient is hemodynamically stable.  Temp 98.2 F.  Heart is tachycardic, regular rhythm.  Lungs are clear to auscultation.   Imaging, UA and labs obtained and reviewed. Leukocytosis of 16.5. Na 129. K 3.0. Cl 84.  CO2 30. Venous pH 7.560. Findings consistent with metabolic alkalosis. Mg 1.0.  Potassium and magnesium repleted.  UA shows infection.  2g of IV Rocephin given. CT abdomen pelvis shows mild intrahepatic and extrahepatic ductal dilatation and postcholecystectomy patient.  Possible enteritis also noted.  Will plan to admit patient.  Results of today's workup discussed with patient.  Any questions or concerns were answered.  Patient is agreeable with plan.  Stable for admission.       CONSULTS: (Who and What was discussed)  IP CONSULT TO HOSPITALIST  Spoke with Dr Georgeann Oppenheim (IM). Discussed case. She will admit pt.       FINAL IMPRESSION      1. Acute pyelonephritis    2. Nausea and vomiting, unspecified vomiting type    3. Hypokalemia    4. Hypomagnesemia          DISPOSITION/PLAN     DISPOSITION Decision To Admit 05/07/2023 01:55:57 PM    DISPOSITION  Disposition: Admit to med/surg floor  Patient condition is stable    05/07/23, 11:20 AM EDT.    Laverda Sorenson, MD  Emergency Medicine    PATIENT REFERRED TO:  No follow-up provider specified.    DISCHARGE MEDICATIONS:  New Prescriptions    No medications on file       DISCONTINUED MEDICATIONS:  Discontinued Medications    No medications on file              (Please note that portions of this note were completed with a voice recognition program.  Efforts were  made to edit the dictations but occasionally words are mis-transcribed.)    Laverda Sorenson, MD  (electronically signed)

## 2023-05-07 NOTE — H&P (Signed)
[facility]ist [facility]   History and Physical      CHIEF COMPLAINT:  vomiting    History of Present Illness:  57 y.o. female with a history of HTN, thyroid disease, HIV infection, DM, tobacco use presents with nausea and vomiting for two months and lower abdominal pain. Patient also reports a weight loss of 30lbs this year. Patient reports she was treated outpatient for a UTI twice. CXR showed no acute cardiopulmonary disease. CT abdomen/pelvis showed mild intrahepatic and extrahepatic ductal dilation, hiatal hernia, fluid filled bowel loops. Abnormal labs:  BG 327, WBC 16.5, platelets 639, Na 129, potassium 3.0, CO2 30, chloride 84, alk phos 204, UA >1000 glucose, large hgb, small leukocyte esterase, 21-50 WBCs, 6-9 RBCs, 3+ bacteria, pH venous 7.560, beta hydroxy 0.53, magnesium 1.0. ED course included 2000mg  ceftriaxone, 25mg  benadryl, 15mg  toradol, 4000mg  magnesium sulfate, 10mg  reglan, 4mg  zofran, potassium chloride PO, Potassium Chloride IV, 1L NSS bolus.     Informant(s) for H&P:Patient and EMR    REVIEW OF SYSTEMS:  Abdominal pain, nausea, weight loss, dysuria. no fevers, chills, cp, sob, v, ha, vision/hearing changes, hot/cold flashes, other open skin lesions, diarrhea, constipation, hematuria unless noted in HPI. Complete ROS performed with the patient and is otherwise negative.      PMH:  Past Medical History:   Diagnosis Date    Diabetes mellitus (HCC)     HIV infection (HCC)     HTN (hypertension) 08/19/2011    Thyroid disease     Tobacco use disorder affecting pregnancy, antepartum     Tobacco use disorder affecting pregnancy, antepartum        Surgical History:  Past Surgical History:   Procedure Laterality Date    CHOLECYSTECTOMY      THYROID SURGERY         Medications Prior to Admission:    Prior to Admission medications    Medication Sig Start Date End Date Taking? Authorizing Provider   dicyclomine (BENTYL) 10 MG capsule Take 2 capsules by mouth 4 times daily  (before meals and nightly) 07/03/16 07/03/17  Delories Heinz, MD   famotidine (PEPCID) 20 MG tablet Take 1 tablet by mouth 2 times daily for 7 days 07/03/16 07/10/16  Delories Heinz, MD   metFORMIN (GLUCOPHAGE) 1000 MG tablet Take 1,000 mg by mouth    [provider]   methimazole (TAPAZOLE) 10 MG tablet Take 15 mg by mouth daily    [provider]   propranolol (INDERAL LA) 120 MG extended release capsule Take 120 mg by mouth daily    [provider]   insulin glargine (LANTUS) 100 UNIT/ML injection vial Inject 36 Units into the skin daily    [provider]   Elviteg-Cobic-Emtricit-TenofAF (GENVOYA) 150-150-200-10 MG TABS Take 1 tablet by mouth daily    [provider]       Allergies:    Sulfa antibiotics and Codeine    Social History:    reports that she has been smoking cigarettes. She has never used smokeless tobacco. She reports that she does not drink alcohol and does not use drugs.      Family History:   family history is not on file.   Mother- MI  Father- none    PHYSICAL EXAM:  Vitals:  BP 118/75   Pulse 98   Temp 98.2 F (36.8 C) (Oral)   Resp 23   Ht 1.575 m (5\' 2" )   Wt 44.3 kg (97 lb 9.6 oz)  SpO2 97%   BMI 17.85 kg/m     General Appearance: alert and oriented to person, place and time and in no acute distress. Poor dentition. Exophthalmus.   Skin: warm and dry  Head: normocephalic and atraumatic  Eyes: pupils equal, round, and reactive to light, extraocular eye movements intact, conjunctivae normal  Neck: neck supple and non tender without mass   Pulmonary/Chest: clear to auscultation bilaterally- no wheezes, rales or rhonchi, normal air movement, no respiratory distress  Cardiovascular: normal rate, normal S1 and S2 and no carotid bruits  Abdomen: soft, generalized tenderness, non-distended, normal bowel sounds, no masses or organomegaly  Extremities: no cyanosis, no clubbing and no edema  Neurologic: no cranial nerve deficit and speech  normal    LABS:  Recent Labs     05/07/23  1226   NA 129*   K 3.0*   CL 84*   CO2 30*   BUN 5*   CREATININE 0.5   GLUCOSE 328*   CALCIUM 9.0       Recent Labs     05/07/23  1226   WBC 16.5*   RBC 4.87   HGB 14.2   HCT 41.6   MCV 85.4   MCH 29.2   MCHC 34.1   RDW 12.9   PLT 639*   MPV 8.8       Recent Labs     05/07/23  1216   POCGLU 327*           Radiology: CT ABDOMEN PELVIS W IV CONTRAST Additional Contrast? None    Result Date: 05/07/2023  EXAMINATION: CT OF THE ABDOMEN AND PELVIS WITH CONTRAST 05/07/2023 2:47 pm TECHNIQUE: CT of the abdomen and pelvis was performed with the administration of intravenous contrast. Multiplanar reformatted images are provided for review. Automated exposure control, iterative reconstruction, and/or weight based adjustment of the mA/kV was utilized to reduce the radiation dose to as low as reasonably achievable. COMPARISON: None. HISTORY: ORDERING SYSTEM PROVIDED HISTORY: abd pain, vomiting TECHNOLOGIST PROVIDED HISTORY: Reason for exam:->abd pain, vomiting Additional Contrast?->None Decision Support Exception - unselect if not a suspected or confirmed emergency medical condition->Emergency Medical Condition (MA) FINDINGS: Lower Chest: Normal visualized lung bases. Cardiac size normal. No pericardial effusion. No coronary artery calcification.  Calcification mitral valve versus mitral valve annulus. Organs: Cranial caudal dimension of liver 19 cm.  Normal in contour and attenuation. Mild intrahepatic ductal dilatation.  Common duct measures 10 mm at level right hepatic artery, and portal vein. Gallbladder surgically absent. Spleen is normal in size.No splenic masses or calcification identified. Normal pancreas. No evidence of ductal dilatation. Normal adrenal glands. Kidneys normal in size. Kidneys enhance symmetrically and uniformly. No renal calculi.No hydronephrosis. GI/Bowel: Small hiatal hernia.  Several normal caliber fluid-filled small bowel loops identified. Normal in  caliber. Appendix without anomaly.. Pelvis: Normal bladder. No pelvic masses. Peritoneum/Retroperitoneum No free fluid, free air, organized fluid collection. Aorta is normal in course and caliber. No lymph node enlargement. Ureters normal in course and caliber. Soft tissues: No hernia identified. No edema or masses. Bones: No osteoblastic, and no osteolytic lesions. Posterior disc space narrowing, L5-S1.  Remote avulsion chip fracture versus limbus deformity posterior endplate S1. No postoperative changes. No fractures.     1. Mild intrahepatic and extrahepatic ductal dilatation in post cholecystectomy patient. 2. Small hiatal hernia. 3. Partially fluid filled normal caliber small bowel loops.  This finding is nonspecific, but may be seen in enteritis in the appropriate clinical setting     XR  CHEST PORTABLE    Result Date: 05/07/2023  EXAMINATION: ONE XRAY VIEW OF THE CHEST 05/07/2023 1:26 pm COMPARISON: None. HISTORY: ORDERING SYSTEM PROVIDED HISTORY: Chest Pain TECHNOLOGIST PROVIDED HISTORY: Reason for exam:->Chest Pain FINDINGS: Portable chest reveals cardiac and mediastinal silhouettes within normal limits.  The lung fields are grossly clear.  No focal parenchymal opacification present.  No pleural effusion or pneumothorax.  The bony structures reveal minimal multilevel degenerative changes seen within the fine.  Pulmonary vascularity is within normal limits.     No acute cardiopulmonary disease.       EKG:     ASSESSMENT:      Principal Problem:    UTI (urinary tract infection)  Active Problems:    Hypokalemia    Hypomagnesemia    Uncontrolled diabetes mellitus with hyperglycemia (HCC)  Resolved Problems:    * No resolved hospital problems. *    PLAN:    UTI- UA and UC ordered. Follow cultures. Rocephin daily. Procal and CROP ordered. US kidneys ordered.   Leukocytosis- WBC 16.5. abx. Monitor on daily CBC. Follow cultures.   Hypokalemia- K 3.0. replaced in ED. Monitor on daily BMP.   Hyponatremia- Na 129. IVF.  Monitor on daily BMP.  Hypomagnesemia- 1.0. replaced in ED. Monitor daily.   Uncontrolled DM with hyperglycemia- A1C ordered. Lantus 20u daily. SSI ordered. ACHS blood sugar checks. Hypoglycemic protocol. Consult to endocrinology, appreciate their input. CC diet.   HTN-stable. Monitor.   Thyroid disease- TSH, T4 free, and T3 ordered. Continue tapazole.   Tobacco use- nicotine patch ordered. Cessation education.     Code Status: Full  DVT prophylaxis: lovenox    NOTE: This report was transcribed using voice recognition software. Every effort was made to ensure accuracy; however, inadvertent computerized transcription errors may be present.     Electronically signed by Gerald Leitz, APRN - CNP on 05/07/2023 at 6:43 PM

## 2023-05-08 LAB — CBC WITH AUTO DIFFERENTIAL
Basophils %: 0 % (ref 0.0–2.0)
Basophils Absolute: 0.06 10*3/uL (ref 0.00–0.20)
Eosinophils %: 0 % (ref 0–6)
Eosinophils Absolute: 0.03 10*3/uL — ABNORMAL LOW (ref 0.05–0.50)
Hematocrit: 36.1 % (ref 34.0–48.0)
Hemoglobin: 11.9 g/dL (ref 11.5–15.5)
Immature Granulocytes %: 1 % (ref 0.0–5.0)
Immature Granulocytes Absolute: 0.14 10*3/uL (ref 0.00–0.58)
Lymphocytes %: 13 % — ABNORMAL LOW (ref 20.0–42.0)
Lymphocytes Absolute: 1.92 10*3/uL (ref 1.50–4.00)
MCH: 29 pg (ref 26.0–35.0)
MCHC: 33 g/dL (ref 32.0–34.5)
MCV: 87.8 fL (ref 80.0–99.9)
MPV: 9.3 fL (ref 7.0–12.0)
Monocytes %: 5 % (ref 2.0–12.0)
Monocytes Absolute: 0.65 10*3/uL (ref 0.10–0.95)
Neutrophils %: 81 % — ABNORMAL HIGH (ref 43.0–80.0)
Neutrophils Absolute: 11.5 10*3/uL — ABNORMAL HIGH (ref 1.80–7.30)
Platelets: 553 10*3/uL — ABNORMAL HIGH (ref 130–450)
RBC: 4.11 m/uL (ref 3.50–5.50)
RDW: 12.9 % (ref 11.5–15.0)
WBC: 14.3 10*3/uL — ABNORMAL HIGH (ref 4.5–11.5)

## 2023-05-08 LAB — COMPREHENSIVE METABOLIC PANEL W/ REFLEX TO MG FOR LOW K
ALT: <5 U/L (ref 0–32)
AST: 10 U/L (ref 0–31)
Albumin: 2.9 g/dL — ABNORMAL LOW (ref 3.5–5.2)
Alkaline Phosphatase: 163 U/L — ABNORMAL HIGH (ref 35–104)
Anion Gap: 10 mmol/L (ref 7–16)
BUN: 4 mg/dL — ABNORMAL LOW (ref 6–20)
CO2: 28 mmol/L (ref 22–29)
Calcium: 8.2 mg/dL — ABNORMAL LOW (ref 8.6–10.2)
Chloride: 94 mmol/L — ABNORMAL LOW (ref 98–107)
Creatinine: 0.4 mg/dL — ABNORMAL LOW (ref 0.50–1.00)
Est, Glom Filt Rate: >90 mL/min/{1.73_m2} (ref >60)
Glucose: 224 mg/dL — ABNORMAL HIGH (ref 74–99)
Potassium: 2.9 mmol/L — ABNORMAL LOW (ref 3.5–5.0)
Sodium: 132 mmol/L (ref 132–146)
Total Bilirubin: 0.3 mg/dL (ref 0.0–1.2)
Total Protein: 6.2 g/dL — ABNORMAL LOW (ref 6.4–8.3)

## 2023-05-08 LAB — BASIC METABOLIC PANEL
Anion Gap: 9 mmol/L (ref 7–16)
BUN: 6 mg/dL (ref 6–20)
CO2: 30 mmol/L — ABNORMAL HIGH (ref 22–29)
Calcium: 8.5 mg/dL — ABNORMAL LOW (ref 8.6–10.2)
Chloride: 96 mmol/L — ABNORMAL LOW (ref 98–107)
Creatinine: 0.4 mg/dL — ABNORMAL LOW (ref 0.50–1.00)
Est, Glom Filt Rate: >90 mL/min/{1.73_m2} (ref >60)
Glucose: 221 mg/dL — ABNORMAL HIGH (ref 74–99)
Potassium: 3.5 mmol/L (ref 3.5–5.0)
Sodium: 135 mmol/L (ref 132–146)

## 2023-05-08 LAB — PHOSPHORUS
Phosphorus: 2.2 mg/dL — ABNORMAL LOW (ref 2.5–4.5)
Phosphorus: 1.5 mg/dL — ABNORMAL LOW (ref 2.5–4.5)

## 2023-05-08 LAB — POCT GLUCOSE
POC Glucose: 419 mg/dL — ABNORMAL HIGH (ref 74–99)
POC Glucose: 340 mg/dL — ABNORMAL HIGH (ref 74–99)
POC Glucose: 176 mg/dL — ABNORMAL HIGH (ref 74–99)
POC Glucose: 278 mg/dL — ABNORMAL HIGH (ref 74–99)
POC Glucose: 355 mg/dL — ABNORMAL HIGH (ref 74–99)

## 2023-05-08 LAB — HEMOGLOBIN A1C: Hemoglobin A1C: 12.7 % — ABNORMAL HIGH (ref 4.0–5.6)

## 2023-05-08 LAB — C-REACTIVE PROTEIN: CRP: 134 mg/L — ABNORMAL HIGH (ref 0–5)

## 2023-05-08 LAB — T4, FREE: T4 Free: 1.3 ng/dL (ref 0.9–1.7)

## 2023-05-08 LAB — MAGNESIUM
Magnesium: 2.2 mg/dL (ref 1.6–2.6)
Magnesium: 1.8 mg/dL (ref 1.6–2.6)

## 2023-05-08 LAB — TSH: TSH: 0.74 u[IU]/mL (ref 0.27–4.20)

## 2023-05-08 LAB — T3: T3, Total: 88 ng/dL (ref 80–200)

## 2023-05-08 LAB — PROCALCITONIN: Procalcitonin: 0.65 ng/mL — ABNORMAL HIGH (ref 0.00–0.08)

## 2023-05-08 MED ORDER — INSULIN LISPRO 100 UNIT/ML IJ SOLN
100 | Freq: Four times a day (QID) | INTRAMUSCULAR | Status: DC
Start: 2023-05-08 — End: 2023-05-09
  Administered 2023-05-09: 2 [IU] via SUBCUTANEOUS
  Administered 2023-05-09: 11:00:00 4 [IU] via SUBCUTANEOUS

## 2023-05-08 MED ORDER — GLUCAGON HCL (DIAGNOSTIC) 1 MG IJ SOLR
1 | INTRAMUSCULAR | Status: DC | PRN
Start: 2023-05-08 — End: 2023-05-10

## 2023-05-08 MED ORDER — DEXTROSE 10 % IV BOLUS
INTRAVENOUS | Status: DC | PRN
Start: 2023-05-08 — End: 2023-05-10

## 2023-05-08 MED ORDER — LACTATED RINGERS IV SOLN
INTRAVENOUS | Status: DC
Start: 2023-05-08 — End: 2023-05-10
  Administered 2023-05-08 – 2023-05-09 (×2): via INTRAVENOUS

## 2023-05-08 MED ORDER — INSULIN LISPRO 100 UNIT/ML IJ SOLN
100 | Freq: Once | INTRAMUSCULAR | Status: AC
Start: 2023-05-08 — End: 2023-05-07
  Administered 2023-05-08: 03:00:00 4 [IU] via SUBCUTANEOUS

## 2023-05-08 MED ORDER — INSULIN LISPRO 100 UNIT/ML IJ SOLN
100 | Freq: Three times a day (TID) | INTRAMUSCULAR | Status: DC
Start: 2023-05-08 — End: 2023-05-10
  Administered 2023-05-08 – 2023-05-10 (×5): 5 [IU] via SUBCUTANEOUS

## 2023-05-08 MED ORDER — SENNA-DOCUSATE SODIUM 8.6-50 MG PO TABS
Freq: Two times a day (BID) | ORAL | Status: DC
Start: 2023-05-08 — End: 2023-05-10
  Administered 2023-05-08 – 2023-05-10 (×5): 2 via ORAL

## 2023-05-08 MED ORDER — DEXTROSE 10 % IV SOLN
10 | INTRAVENOUS | Status: DC | PRN
Start: 2023-05-08 — End: 2023-05-10

## 2023-05-08 MED ORDER — GLUCOSE 4 G PO CHEW
4 | ORAL | Status: DC | PRN
Start: 2023-05-08 — End: 2023-05-10

## 2023-05-08 MED ORDER — METHIMAZOLE 5 MG PO TABS
5 MG | Freq: Every day | ORAL | Status: AC
Start: 2023-05-08 — End: 2023-05-09
  Administered 2023-05-08 – 2023-05-09 (×3): 15 mg via ORAL

## 2023-05-08 MED ORDER — POTASSIUM CHLORIDE 10 MEQ/100ML IV SOLN
10 MEQ/0ML | INTRAVENOUS | Status: AC
Start: 2023-05-08 — End: 2023-05-08
  Administered 2023-05-08 (×4): 10 meq via INTRAVENOUS

## 2023-05-08 MED FILL — LOVENOX 30 MG/0.3ML IJ SOSY: 30 MG/0.3ML | INTRAMUSCULAR | Qty: 0.3

## 2023-05-08 MED FILL — CEFTRIAXONE SODIUM 1 G IJ SOLR: 1 g | INTRAMUSCULAR | Qty: 1000

## 2023-05-08 MED FILL — MAPAP 325 MG PO TABS: 325 MG | ORAL | Qty: 2

## 2023-05-08 MED FILL — POTASSIUM CHLORIDE 10 MEQ/100ML IV SOLN: 10 MEQ/0ML | INTRAVENOUS | Qty: 100

## 2023-05-08 MED FILL — NICOTINE 14 MG/24HR TD PT24: 14 MG/24HR | TRANSDERMAL | Qty: 1

## 2023-05-08 MED FILL — PROPRANOLOL HCL ER 60 MG PO CP24: 60 MG | ORAL | Qty: 2

## 2023-05-08 MED FILL — METHIMAZOLE 5 MG PO TABS: 5 MG | ORAL | Qty: 3

## 2023-05-08 MED FILL — DOK PLUS 50-8.6 MG PO TABS: ORAL | Qty: 2

## 2023-05-08 MED FILL — METHIMAZOLE 5 MG PO TABS: 5 MG | ORAL | Qty: 1

## 2023-05-08 NOTE — Care Coordination-Inpatient (Signed)
05/08/2023  Social Work Discharge Planning:UTI. This worker met with Pt to discuss social worker role and transition of care/discharge planning. Pt is independent and resides at home with her spouse and daughter. No DME. Room air. IVATB. Pt plans to return home. Currently no needs., Possible hiatal hernia? Pt is in between pharmacys at this time due to [facility] shutting down. PCP is Dr. Owens Shark. Electronically signed by Laverna Peace, LSW on 05/08/2023 at 1:41 PM

## 2023-05-08 NOTE — Progress Notes (Signed)
4 Eyes Skin Assessment     NAME:  Maria Valdez  DATE OF BIRTH:  09-14-1965  MEDICAL RECORD NUMBER:  [id number]    The patient is being assessed for  Admission    I agree that at least one RN has performed a thorough Head to Toe Skin Assessment on the patient. ALL assessment sites listed below have been assessed.      Areas assessed by both nurses:    Head, Face, Ears, Shoulders, Back, Chest, Arms, Elbows, Hands, Sacrum. Buttock, Coccyx, Ischium, Legs. Feet and Heels, Under Medical Devices , and Other          Does the Patient have a Wound? No noted wound(s)       Braden Prevention initiated by RN: No  Wound Care Orders initiated by RN: No    Pressure Injury (Stage 3,4, Unstageable, DTI, NWPT, and Complex wounds) if present, place Wound referral order by RN under ORDER ENTRY: No    New Ostomies, if present place, Ostomy referral order under ORDER ENTRY: No     Nurse 1 eSignature: Electronically signed by Durward Mallard, RN on 05/08/23 at 12:08 AM EDT    **SHARE this note so that the co-signing nurse can place an eSignature**    Nurse 2 eSignature: Electronically signed by Hartford Poli, RN on 05/08/23 at 12:09 AM EDT

## 2023-05-08 NOTE — Consults (Addendum)
ENDOCRINOLOGY INITIAL CONSULTATION NOTE      Date of admission: 05/07/2023  Date of service: 05/08/2023  Admitting physician: Lazarus Salines, MD   Primary Care Physician: Clovis Pu, DO  Consultant physician: Sharyn Blitz MD     Reason for the consultation:  Uncontrolled DM, hyperthyroidism    History of Present Illness:  The history is provided by the patient. Accuracy of the patient data is excellent    Maria Valdez is a very pleasant 57 y.o. old female with PMH of HTN, hyperthyroidism, type 2 diabetes mellitus, recurrent UTI, and other listed below admitted to [facility] on [facility]/07/2023 because of nausea and vomiting for two months, associated with lower abdominal pain. Patient also reported to have weight loss of ~30 lbs this year and has been treated twice as outpatient for UTI. On admission, initial labs revealed BG 327, WBC 16.5, platelets 639, Na 129. Potassium 3, anion gap 1, CO2 30, beta-hydroxybutyrate 0.53, HbA1C 12.7, endocrine service was consulted for diabetes management.    Prior to admission  The patient was diagnosed with diabetes nearly 8 years ago.  Diabetes was diagnosed at the same time when she was diagnosed with Graves' disease.  Prior to this admission she was on Lantus 20 units daily and Humalog per sliding scale.  She could not give me a set amount of Humalog with her meals. Patient follows with Endocrinology in [place]. Patient has had hypoglycemic episodes. Patient has not been eating consistent carbohydrate meals, self-blood glucose monitoring has been above goal prior to admission. In addition, patient denied macrovascular or microvascular complications. The patient is not up to date with yearly diabetic eye exam.     Lab Results   Component Value Date/Time    LABA1C 12.7 05/08/2023 03:15 AM     The patient was also diagnosed with Graves' disease in early 8 years ago.  She has been on methimazole all this years.  Prior to admission she was on methimazole 10 mg 1  tablet 5 days a week and skipping weekends.  The patient lost a significant amount of weight over the past 6 months    Inpatient diet:   Carb Restricted diet     Point of care glucose monitoring   (Independently reviewed)   Recent Labs     05/07/23  1216 05/07/23  2226 05/08/23  0029 05/08/23  0641 05/08/23  1100   POCGLU 327* 419* 340* 176* 278*       Past medical history:   Past Medical History:   Diagnosis Date    Diabetes mellitus (HCC)     HIV infection (HCC)     HTN (hypertension) 08/19/2011    Thyroid disease     Tobacco use disorder affecting pregnancy, antepartum     Tobacco use disorder affecting pregnancy, antepartum        Past surgical history:  Past Surgical History:   Procedure Laterality Date    CHOLECYSTECTOMY      THYROID SURGERY         Social history:   Tobacco:   reports that she has been smoking cigarettes. She has never used smokeless tobacco.  Alcohol:   reports no history of alcohol use.  Drugs:   reports no history of drug use.    Family history:    No family history on file.    Allergy and drug reactions:   Allergies   Allergen Reactions    Sulfa Antibiotics Hives and Swelling    Codeine Nausea Only  Scheduled Meds:   sennosides-docusate sodium  2 tablet Oral BID    insulin lispro  5 Units SubCUTAneous TID WC    nicotine  1 patch TransDERmal Daily    sodium chloride flush  5-40 mL IntraVENous 2 times per day    enoxaparin  30 mg SubCUTAneous Daily    cefTRIAXone (ROCEPHIN) IV  1,000 mg IntraVENous Q24H    insulin glargine  20 Units SubCUTAneous Daily    propranolol  120 mg Oral Daily    insulin lispro  0-4 Units SubCUTAneous 4x Daily AC & HS    methIMAzole  15 mg Oral Daily       PRN Meds:   glucose, 4 tablet, PRN  dextrose bolus, 125 mL, PRN   Or  dextrose bolus, 250 mL, PRN  glucagon (rDNA), 1 mg, PRN  dextrose, , Continuous PRN  sodium chloride flush, 5-40 mL, PRN  sodium chloride, , PRN  polyethylene glycol, 17 g, Daily PRN  acetaminophen, 650 mg, Q6H PRN   Or  acetaminophen, 650  mg, Q6H PRN  prochlorperazine, 10 mg, Q8H PRN   Or  prochlorperazine, 10 mg, Q6H PRN      Continuous Infusions:   lactated ringers IV soln 50 mL/hr at 05/08/23 1028    dextrose      sodium chloride         Review of Systems  All systems reviewed. All negative except for symptoms mentioned in HPI     OBJECTIVE    BP 109/72   Pulse 88   Temp 98.4 F (36.9 C) (Oral)   Resp 18   Ht 1.575 m (5\' 2" )   Wt 44.3 kg (97 lb 11.2 oz)   SpO2 99%   BMI 17.87 kg/m     Intake/Output Summary (Last 24 hours) at 05/08/2023 1437  Last data filed at 05/08/2023 0700  Gross per 24 hour   Intake 130 ml   Output --   Net 130 ml       Physical examination:  General: awake alert, oriented x3  HEENT: exophthalmos, normocephalic non traumatic  Neck: supple, left thyroid palpable  Pulm: good equal air entry no added sounds  CVS: S1 + S2  Abd: soft lax, no tenderness  Skin: warm, skin laxity present  Neuro: CN intact, sensation decreased bilateral , muscle power normal  Psych: normal mood, and affect    Review of Laboratory Data:  I personally reviewed the following labs:   Recent Labs     05/07/23  1226 05/08/23  0315   WBC 16.5* 14.3*   RBC 4.87 4.11   HGB 14.2 11.9   HCT 41.6 36.1   MCV 85.4 87.8   MCH 29.2 29.0   MCHC 34.1 33.0   RDW 12.9 12.9   PLT 639* 553*   MPV 8.8 9.3     Recent Labs     05/07/23  1226 05/08/23  0315 05/08/23  1257   NA 129* 132 135   K 3.0* 2.9* 3.5   CL 84* 94* 96*   CO2 30* 28 30*   BUN 5* 4* 6   CREATININE 0.5 0.4* 0.4*   GLUCOSE 328* 224* 221*   CALCIUM 9.0 8.2* 8.5*   BILITOT 0.5 0.3  --    ALKPHOS 204* 163*  --    AST 10 10  --    ALT <5 <5  --      Beta-Hydroxybutyrate   Date Value Ref Range Status   05/07/2023 0.53 (  H) 0.02 - 0.27 mmol/L Final     Lab Results   Component Value Date/Time    LABA1C 12.7 05/08/2023 03:15 AM     Lab Results   Component Value Date/Time    TSH 0.74 05/08/2023 03:15 AM    T4FREE 1.3 05/08/2023 03:15 AM     Lab Results   Component Value Date/Time    LABA1C 12.7 05/08/2023 03:15  AM    GLUCOSE 221 05/08/2023 12:57 PM    GLUCOSE 90 09/24/2010 11:30 AM     Lab Results   Component Value Date/Time    TRIG 108 05/21/2012 03:15 PM    HDL 67.0 05/21/2012 03:15 PM    CHOL 169 05/21/2012 03:15 PM       Blood culture   Lab Results   Component Value Date/Time    BC 5 Days- no growth 11/10/2014 08:00 PM       Radiology:  [place] RETROPERITONEAL COMPLETE   Final Result   1. Diffuse irregular urinary bladder wall thickening up to 5.7 mm with debris   within the urinary bladder.   2. Unremarkable ultrasound of the kidneys.         CT ABDOMEN PELVIS W IV CONTRAST Additional Contrast? None   Final Result   1. Mild intrahepatic and extrahepatic ductal dilatation in post   cholecystectomy patient.   2. Small hiatal hernia.   3. Partially fluid filled normal caliber small bowel loops.  This finding is   nonspecific, but may be seen in enteritis in the appropriate clinical setting         XR CHEST PORTABLE   Final Result   No acute cardiopulmonary disease.             Medical Records/Labs/Images review:   I personally reviewed and summarized previous records   All labs and imaging were reviewed independently     ASSESSMENT & PLAN   ANEISHA LOVERN, a 57 y.o.-old female seen today for inpatient diabetes management    Poorly controlled diabetes  Patient's diabetes is uncontrolled with A1C 12.7  We recommend the following DM regimen  Lantus 20 units daily  Humalog 5 units three times daily with meals  medium-dose sliding scale ACHS  Continue glucose check with meals and at bedtime  Will titrate insulin dose based on the blood glucose trend & insulin requirement  Given the history of autoimmune thyroid disease Graves' disease I am concerning about the possibility of Latent autoimmune diabetes of adults (LADA) diabetes.  We will obtain gad 65 antibodies.     Dietary noncompliance  Discussed with patient the importance of eating consistent carbohydrate meals, avoiding high glycemic index food. Also, discussed with  patient the risk and negative consequences of dietary noncompliance on blood glucose control, blood pressure and weight    Graves' disease  This was diagnosed at the same time of diabetes.  Prior to admission she was on methimazole 10 mg 5 days a week and none on Saturday and Sunday.  Thyroid function test this admission showed TSH 0.74, Free T4 1.3  For now, I am going to continue the same dose of methimazole.  I had a long discussion with the patient and explained all the side effects of methimazole in details.  Given the active eye disease, radioactive iodine will not be a good option in this patient.  She would like to stay on methimazole and is not interesting in thyroid surgery.  She actually had a partial of her thyroid removed  many years ago for large thyroid nodule    Acute pyelonephritis  Rocephin 1 g daily  Management per Primary team    Interdisciplinary plan for communication with healthcare providers:   Consult recommendations were discussed with the Primary Service/Nursing staff      The above issues were reviewed with the patient who understood and agreed with the plan.    Thank you for allowing [place] to participate in the care of this patient. Please do not hesitate to contact us with any additional questions.     Sherlean Foot, MD PGY-1   Endocrinology    I saw the patient and discussed the management with the resident physician Dr. Arlina Robes, Bjorn Loser, MD . I reviewed and agree with the findings and plan as documented in the resident's note     Sharyn Blitz MD  Endocrinologist, [facility] Diabetes Care and Endocrinology   [facility]  [facility] MED SURG/TELE  [address]  [place] [address]  Dept: [phone]  Loc: [phone]   Phone: [phone]  Fax: [phone]  --------------------------------------------  An electronic signature was used to authenticate this note. Shelly Flatten, MD on 05/08/2023 at 2:37 PM

## 2023-05-08 NOTE — Plan of Care (Signed)
Problem: Chronic Conditions and Co-morbidities  Goal: Patient's chronic conditions and co-morbidity symptoms are monitored and maintained or improved  Outcome: Progressing  Flowsheets (Taken 05/07/2023 2200)  Care Plan - Patient's Chronic Conditions and Co-Morbidity Symptoms are Monitored and Maintained or Improved: Monitor and assess patient's chronic conditions and comorbid symptoms for stability, deterioration, or improvement     Problem: Discharge Planning  Goal: Discharge to home or other facility with appropriate resources  Outcome: Progressing  Flowsheets (Taken 05/07/2023 2200)  Discharge to home or other facility with appropriate resources: Identify barriers to discharge with patient and caregiver     Problem: Pain  Goal: Verbalizes/displays adequate comfort level or baseline comfort level  Outcome: Progressing

## 2023-05-08 NOTE — Plan of Care (Signed)
Problem: Chronic Conditions and Co-morbidities  Goal: Patient's chronic conditions and co-morbidity symptoms are monitored and maintained or improved  Outcome: Progressing     Problem: Discharge Planning  Goal: Discharge to home or other facility with appropriate resources  Outcome: Progressing     Problem: Pain  Goal: Verbalizes/displays adequate comfort level or baseline comfort level  05/08/2023 2105 by Maggie Schwalbe., RN  Outcome: Progressing  05/08/2023 1058 by Early Chars, RN  Outcome: Progressing

## 2023-05-08 NOTE — Progress Notes (Signed)
[facility] Progress Note    Admitting Date and Time: 05/07/2023 11:01 AM  Admit Dx: Hypokalemia [E87.6]  Hypomagnesemia [E83.42]  UTI (urinary tract infection) [N39.0]  Acute pyelonephritis [N10]  Nausea and vomiting, unspecified vomiting type [R11.2]    Subjective:  Patient is being followed for Hypokalemia [E87.6]  Hypomagnesemia [E83.42]  UTI (urinary tract infection) [N39.0]  Acute pyelonephritis [N10]  Nausea and vomiting, unspecified vomiting type [R11.2]   Pt was seen and examined today. Denies any new issues    ROS: denies fever, chills, cp, sob, n/v, HA unless stated above.     nicotine  1 patch TransDERmal Daily    sodium chloride flush  5-40 mL IntraVENous 2 times per day    enoxaparin  30 mg SubCUTAneous Daily    cefTRIAXone (ROCEPHIN) IV  1,000 mg IntraVENous Q24H    insulin glargine  20 Units SubCUTAneous Daily    propranolol  120 mg Oral Daily    insulin lispro  0-4 Units SubCUTAneous 4x Daily AC & HS    methIMAzole  15 mg Oral Daily     sodium chloride flush, 5-40 mL, PRN  sodium chloride, , PRN  polyethylene glycol, 17 g, Daily PRN  acetaminophen, 650 mg, Q6H PRN   Or  acetaminophen, 650 mg, Q6H PRN  prochlorperazine, 10 mg, Q8H PRN   Or  prochlorperazine, 10 mg, Q6H PRN         Objective:    BP 109/72   Pulse 88   Temp 98.4 F (36.9 C) (Oral)   Resp 18   Ht 1.575 m (5\' 2" )   Wt 44.3 kg (97 lb 11.2 oz)   SpO2 99%   BMI 17.87 kg/m     General Appearance: alert and oriented to person, place and time and in no acute distress  Skin: warm and dry  Head: normocephalic and atraumatic  Pulmonary/Chest: clear to auscultation bilaterally- no wheezes, rales or rhonchi, normal air movement, no respiratory distress  Cardiovascular: normal rate, normal S1 and S2 and no carotid bruits  Abdomen: soft, tender over LLQ, non-distended, normal bowel sounds  Extremities: no cyanosis, no clubbing and no edema      Recent Labs     05/07/23  1226 05/08/23  0315   NA 129* 132   K 3.0* 2.9*   CL  84* 94*   CO2 30* 28   BUN 5* 4*   CREATININE 0.5 0.4*   GLUCOSE 328* 224*   CALCIUM 9.0 8.2*       Recent Labs     05/07/23  1226 05/08/23  0315   WBC 16.5* 14.3*   RBC 4.87 4.11   HGB 14.2 11.9   HCT 41.6 36.1   MCV 85.4 87.8   MCH 29.2 29.0   MCHC 34.1 33.0   RDW 12.9 12.9   PLT 639* 553*   MPV 8.8 9.3       Radiology:   [place] RETROPERITONEAL COMPLETE   Final Result   1. Diffuse irregular urinary bladder wall thickening up to 5.7 mm with debris   within the urinary bladder.   2. Unremarkable ultrasound of the kidneys.         CT ABDOMEN PELVIS W IV CONTRAST Additional Contrast? None   Final Result   1. Mild intrahepatic and extrahepatic ductal dilatation in post   cholecystectomy patient.   2. Small hiatal hernia.   3. Partially fluid filled normal caliber small bowel loops.  This finding is  nonspecific, but may be seen in enteritis in the appropriate clinical setting         XR CHEST PORTABLE   Final Result   No acute cardiopulmonary disease.             Assessment:    Principal Problem:    UTI (urinary tract infection)  Active Problems:    Hypokalemia    Hypomagnesemia    Uncontrolled diabetes mellitus with hyperglycemia (HCC)    Dysphagia  Resolved Problems:    * No resolved hospital problems. *      Plan:  Continue Rocephin  Follow cultures  Replace electrolytes  LR 50 mL/h  Endocrinology consulted, appreciate recommendations  SLP evaluation for dysphagia  Continue home medicaitons    Code Status: Full code  DVT/GI ppx: Lovenox/Diet  Dispo: Continue care    Time spent reviewing chart, clinical exam, discussing case and answering questions with staff/consultants/patient/family = 35 min    NOTE: This report was transcribed using voice recognition software. Every effort was made to ensure accuracy; however, inadvertent computerized transcription errors may be present.  Electronically signed by Aletha Halim, MD on 05/08/2023 at 12:49 PM

## 2023-05-08 NOTE — Progress Notes (Signed)
New consult sent to Dr. Abusag.

## 2023-05-08 NOTE — Plan of Care (Signed)
Problem: Chronic Conditions and Co-morbidities  Goal: Patient's chronic conditions and co-morbidity symptoms are monitored and maintained or improved  05/08/2023 0006 by Durward Mallard, RN  Outcome: Progressing  Flowsheets (Taken 05/07/2023 2200)  Care Plan - Patient's Chronic Conditions and Co-Morbidity Symptoms are Monitored and Maintained or Improved: Monitor and assess patient's chronic conditions and comorbid symptoms for stability, deterioration, or improvement     Problem: Discharge Planning  Goal: Discharge to home or other facility with appropriate resources  05/08/2023 0006 by Durward Mallard, RN  Outcome: Progressing  Flowsheets (Taken 05/07/2023 2200)  Discharge to home or other facility with appropriate resources: Identify barriers to discharge with patient and caregiver     Problem: Pain  Goal: Verbalizes/displays adequate comfort level or baseline comfort level  05/08/2023 1058 by Early Chars, RN  Outcome: Progressing  05/08/2023 0006 by Durward Mallard, RN  Outcome: Progressing

## 2023-05-09 LAB — CBC WITH AUTO DIFFERENTIAL
Basophils %: 0 % (ref 0.0–2.0)
Basophils Absolute: 0.05 10*3/uL (ref 0.00–0.20)
Eosinophils %: 1 % (ref 0–6)
Eosinophils Absolute: 0.06 10*3/uL (ref 0.05–0.50)
Hematocrit: 35.5 % (ref 34.0–48.0)
Hemoglobin: 11.5 g/dL (ref 11.5–15.5)
Immature Granulocytes %: 1 % (ref 0.0–5.0)
Immature Granulocytes Absolute: 0.14 10*3/uL (ref 0.00–0.58)
Lymphocytes %: 13 % — ABNORMAL LOW (ref 20.0–42.0)
Lymphocytes Absolute: 1.61 10*3/uL (ref 1.50–4.00)
MCH: 28.8 pg (ref 26.0–35.0)
MCHC: 32.4 g/dL (ref 32.0–34.5)
MCV: 89 fL (ref 80.0–99.9)
MPV: 9.1 fL (ref 7.0–12.0)
Monocytes %: 6 % (ref 2.0–12.0)
Monocytes Absolute: 0.72 10*3/uL (ref 0.10–0.95)
Neutrophils %: 80 % (ref 43.0–80.0)
Neutrophils Absolute: 10.25 10*3/uL — ABNORMAL HIGH (ref 1.80–7.30)
Platelets: 562 10*3/uL — ABNORMAL HIGH (ref 130–450)
RBC: 3.99 m/uL (ref 3.50–5.50)
RDW: 12.9 % (ref 11.5–15.0)
WBC: 12.8 10*3/uL — ABNORMAL HIGH (ref 4.5–11.5)

## 2023-05-09 LAB — COMPREHENSIVE METABOLIC PANEL W/ REFLEX TO MG FOR LOW K
ALT: <5 U/L (ref 0–32)
AST: 10 U/L (ref 0–31)
Albumin: 2.9 g/dL — ABNORMAL LOW (ref 3.5–5.2)
Alkaline Phosphatase: 164 U/L — ABNORMAL HIGH (ref 35–104)
Anion Gap: 9 mmol/L (ref 7–16)
BUN: 5 mg/dL — ABNORMAL LOW (ref 6–20)
CO2: 27 mmol/L (ref 22–29)
Calcium: 8.4 mg/dL — ABNORMAL LOW (ref 8.6–10.2)
Chloride: 97 mmol/L — ABNORMAL LOW (ref 98–107)
Creatinine: 0.3 mg/dL — ABNORMAL LOW (ref 0.50–1.00)
Est, Glom Filt Rate: >90 mL/min/{1.73_m2} (ref >60)
Glucose: 344 mg/dL — ABNORMAL HIGH (ref 74–99)
Potassium: 3.3 mmol/L — ABNORMAL LOW (ref 3.5–5.0)
Sodium: 133 mmol/L (ref 132–146)
Total Bilirubin: 0.2 mg/dL (ref 0.0–1.2)
Total Protein: 6.1 g/dL — ABNORMAL LOW (ref 6.4–8.3)

## 2023-05-09 LAB — BASIC METABOLIC PANEL W/ REFLEX TO MG FOR LOW K
Anion Gap: 12 mmol/L (ref 7–16)
BUN: 5 mg/dL — ABNORMAL LOW (ref 6–20)
CO2: 26 mmol/L (ref 22–29)
Calcium: 8.6 mg/dL (ref 8.6–10.2)
Chloride: 97 mmol/L — ABNORMAL LOW (ref 98–107)
Creatinine: 0.3 mg/dL — ABNORMAL LOW (ref 0.50–1.00)
Est, Glom Filt Rate: >90 mL/min/{1.73_m2} (ref >60)
Glucose: 284 mg/dL — ABNORMAL HIGH (ref 74–99)
Potassium: 3.3 mmol/L — ABNORMAL LOW (ref 3.5–5.0)
Sodium: 135 mmol/L (ref 132–146)

## 2023-05-09 LAB — EKG 12-LEAD
Atrial Rate: 108 {beats}/min
P Axis: 69 degrees
P-R Interval: 130 ms
Q-T Interval: 398 ms
QRS Duration: 80 ms
QTc Calculation (Bazett): 533 ms
R Axis: 67 degrees
T Axis: 33 degrees
Ventricular Rate: 108 {beats}/min

## 2023-05-09 LAB — POCT GLUCOSE
POC Glucose: 196 mg/dL — ABNORMAL HIGH (ref 74–99)
POC Glucose: 242 mg/dL — ABNORMAL HIGH (ref 74–99)
POC Glucose: 285 mg/dL — ABNORMAL HIGH (ref 74–99)
POC Glucose: 324 mg/dL — ABNORMAL HIGH (ref 74–99)

## 2023-05-09 LAB — MAGNESIUM
Magnesium: 1.5 mg/dL — ABNORMAL LOW (ref 1.6–2.6)
Magnesium: 1.5 mg/dL — ABNORMAL LOW (ref 1.6–2.6)

## 2023-05-09 LAB — PHOSPHORUS: Phosphorus: 2.7 mg/dL (ref 2.5–4.5)

## 2023-05-09 MED ORDER — METHIMAZOLE 5 MG PO TABS
5 | Freq: Every day | ORAL | Status: DC
Start: 2023-05-09 — End: 2023-05-10
  Administered 2023-05-10: 13:00:00 10 mg via ORAL

## 2023-05-09 MED ORDER — INSULIN LISPRO 100 UNIT/ML IJ SOLN
100 | Freq: Four times a day (QID) | INTRAMUSCULAR | Status: DC
Start: 2023-05-09 — End: 2023-05-10
  Administered 2023-05-09: 21:00:00 6 [IU] via SUBCUTANEOUS
  Administered 2023-05-10: 10:00:00 8 [IU] via SUBCUTANEOUS
  Administered 2023-05-10: 4 [IU] via SUBCUTANEOUS

## 2023-05-09 MED ORDER — POTASSIUM CHLORIDE CRYS ER 20 MEQ PO TBCR
20 | Freq: Once | ORAL | Status: AC
Start: 2023-05-09 — End: 2023-05-09
  Administered 2023-05-09: 15:00:00 40 meq via ORAL

## 2023-05-09 MED ORDER — POTASSIUM PHOSPHATE 3 MMOL/ML IV SOLN (MIXTURES ONLY)
15 | Freq: Once | INTRAVENOUS | Status: AC
Start: 2023-05-09 — End: 2023-05-09
  Administered 2023-05-09: 15:00:00 20 mmol via INTRAVENOUS

## 2023-05-09 MED ORDER — POTASSIUM CHLORIDE 10 MEQ/100ML IV SOLN
10 | INTRAVENOUS | Status: DC
Start: 2023-05-09 — End: 2023-05-09

## 2023-05-09 MED ORDER — MAGNESIUM SULFATE 2000 MG/50 ML IVPB PREMIX
2 | Freq: Once | INTRAVENOUS | Status: AC
Start: 2023-05-09 — End: 2023-05-09
  Administered 2023-05-09: 15:00:00 2000 mg via INTRAVENOUS

## 2023-05-09 MED FILL — DOK PLUS 50-8.6 MG PO TABS: ORAL | Qty: 2

## 2023-05-09 MED FILL — LOVENOX 30 MG/0.3ML IJ SOSY: 30 MG/0.3ML | INTRAMUSCULAR | Qty: 0.3

## 2023-05-09 MED FILL — NICOTINE 14 MG/24HR TD PT24: 14 MG/24HR | TRANSDERMAL | Qty: 1

## 2023-05-09 MED FILL — DOK PLUS 50-8.6 MG PO TABS: ORAL | Qty: 1

## 2023-05-09 MED FILL — MAPAP 325 MG PO TABS: 325 MG | ORAL | Qty: 2

## 2023-05-09 MED FILL — POTASSIUM CHLORIDE CRYS ER 20 MEQ PO TBCR: 20 MEQ | ORAL | Qty: 2

## 2023-05-09 MED FILL — POTASSIUM PHOSPHATES(66 MEQ K) 45 MMOLE/15ML IV SOLN: 45 MMOLE/15ML | INTRAVENOUS | Qty: 6.67

## 2023-05-09 MED FILL — CEFTRIAXONE SODIUM 1 G IJ SOLR: 1 g | INTRAMUSCULAR | Qty: 1000

## 2023-05-09 MED FILL — PROPRANOLOL HCL ER 60 MG PO CP24: 60 MG | ORAL | Qty: 2

## 2023-05-09 MED FILL — METHIMAZOLE 5 MG PO TABS: 5 MG | ORAL | Qty: 3

## 2023-05-09 MED FILL — MAGNESIUM SULFATE 2 GM/50ML IV SOLN: 2 GM/50ML | INTRAVENOUS | Qty: 50

## 2023-05-09 NOTE — Care Coordination-Inpatient (Signed)
05/09/2023  Social Work Discharge Planning: UTI. Marland Kitchen Pt is independent and resides at home with her spouse and daughter. No DME. Room air. Continues on IVATB. Pt plans to return home. Room air.Currently no needs.Electronically signed by Laverna Peace, LSW on 05/09/2023 at 9:08 AM

## 2023-05-09 NOTE — Progress Notes (Signed)
Per Dr Legrand Rams okay for IV potasium to be given orally

## 2023-05-09 NOTE — Progress Notes (Addendum)
ENDOCRINOLOGY PROGRESS NOTE    Date of Service: 05/09/2023  Date of Admission: 05/07/2023  Admitting Physician: Lazarus Salines, MD   Primary Care Physician: Clovis Pu, DO  Consultant physician: Sharyn Blitz MD     Reason for the consultation:  Uncontrolled DM, hyperthyroidism     History of Present Illness:  The history is provided by the patient. Accuracy of the patient data is excellent.    Maria Valdez is a very pleasant 57 y.o. old female with PMH of HTN, hyperthyroidism, type 2 diabetes mellitus, recurrent UTI, and other listed below admitted to [facility] on 05/07/2023 because of nausea and vomiting for two months, associated with lower abdominal pain. Patient also reported to have weight loss of ~30 lbs this year and has been treated twice as outpatient for UTI. On admission, initial labs revealed BG 327, WBC 16.5, platelets 639, Na 129. Potassium 3, anion gap 1, CO2 30, beta-hydroxybutyrate 0.53, HbA1C 12.7, endocrine service was consulted for diabetes management.     Subjective:  Patient seen and examined, no overnight major events. Appetite is good. Patient reports feeling better compared to yesterday. No hypoglycemia overnight. Denies nausea, vomiting, abdominal pain.    Inpatient diet:   Carb Restricted diet     Point of care glucose monitoring (Independently reviewed)   Recent Labs     05/08/23  0029 05/08/23  0641 05/08/23  1100 05/08/23  1650 05/08/23  1959 05/09/23  0621 05/09/23  1115 05/09/23  1604   POCGLU 340* 176* 278* 355* 242* 324* 196* 285*       Scheduled Meds:   potassium phosphate IVPB (PERIPHERAL LINE)  20 mmol IntraVENous Once    insulin lispro  0-12 Units SubCUTAneous 4x Daily AC & HS    sennosides-docusate sodium  2 tablet Oral BID    insulin lispro  5 Units SubCUTAneous TID WC    nicotine  1 patch TransDERmal Daily    sodium chloride flush  5-40 mL IntraVENous 2 times per day    enoxaparin  30 mg SubCUTAneous Daily    cefTRIAXone (ROCEPHIN) IV  1,000 mg IntraVENous  Q24H    insulin glargine  20 Units SubCUTAneous Daily    propranolol  120 mg Oral Daily    methIMAzole  15 mg Oral Daily     PRN Meds:   glucose, 4 tablet, PRN  dextrose bolus, 125 mL, PRN   Or  dextrose bolus, 250 mL, PRN  glucagon (rDNA), 1 mg, PRN  dextrose, , Continuous PRN  sodium chloride flush, 5-40 mL, PRN  sodium chloride, , PRN  polyethylene glycol, 17 g, Daily PRN  acetaminophen, 650 mg, Q6H PRN   Or  acetaminophen, 650 mg, Q6H PRN  prochlorperazine, 10 mg, Q8H PRN   Or  prochlorperazine, 10 mg, Q6H PRN      Continuous Infusions:   lactated ringers IV soln 50 mL/hr at 05/09/23 0612    dextrose      sodium chloride         Review of Systems  All systems reviewed. All negative except for symptoms mentioned in HPI     OBJECTIVE    BP 137/85   Pulse 85   Temp 98.1 F (36.7 C) (Oral)   Resp 16   Ht 1.575 m (5\' 2" )   Wt 45.6 kg (100 lb 8 oz)   SpO2 97%   BMI 18.38 kg/m     Intake/Output Summary (Last 24 hours) at 05/09/2023 1255  Last data filed at  05/09/2023 0611  Gross per 24 hour   Intake 1126.47 ml   Output --   Net 1126.47 ml       Physical examination:  General: awake alert, oriented x3  HEENT: exophthalmos, normocephalic non traumatic  Neck: supple, left thyroid palpable  Pulm: good equal air entry no added sounds  CVS: S1 + S2  Abd: soft lax, no tenderness  Skin: warm, skin laxity present  Neuro: CN intact, sensation decreased bilateral , muscle power normal  Psych: normal mood, and affect    Review of Laboratory Data:  I have reviewed the following:  Recent Labs     05/07/23  1226 05/08/23  0315 05/09/23  0624   WBC 16.5* 14.3* 12.8*   RBC 4.87 4.11 3.99   HGB 14.2 11.9 11.5   HCT 41.6 36.1 35.5   MCV 85.4 87.8 89.0   MCH 29.2 29.0 28.8   MCHC 34.1 33.0 32.4   RDW 12.9 12.9 12.9   PLT 639* 553* 562*   MPV 8.8 9.3 9.1     Recent Labs     05/07/23  1226 05/08/23  0315 05/08/23  1257 05/09/23  0624   NA 129* 132 135 133   K 3.0* 2.9* 3.5 3.3*   CL 84* 94* 96* 97*   CO2 30* 28 30* 27   BUN 5* 4* 6  5*   CREATININE 0.5 0.4* 0.4* 0.3*   GLUCOSE 328* 224* 221* 344*   CALCIUM 9.0 8.2* 8.5* 8.4*   BILITOT 0.5 0.3  --  0.2   ALKPHOS 204* 163*  --  164*   AST 10 10  --  10   ALT <5 <5  --  <5     Beta-Hydroxybutyrate   Date Value Ref Range Status   05/07/2023 0.53 (H) 0.02 - 0.27 mmol/L Final     Lab Results   Component Value Date/Time    LABA1C 12.7 05/08/2023 03:15 AM     Lab Results   Component Value Date/Time    TSH 0.74 05/08/2023 03:15 AM    T4FREE 1.3 05/08/2023 03:15 AM    T3TOTAL 88 05/08/2023 03:15 AM     Lab Results   Component Value Date/Time    LABA1C 12.7 05/08/2023 03:15 AM    GLUCOSE 344 05/09/2023 06:24 AM    GLUCOSE 90 09/24/2010 11:30 AM     Lab Results   Component Value Date/Time    TRIG 108 05/21/2012 03:15 PM    HDL 67.0 05/21/2012 03:15 PM    CHOL 169 05/21/2012 03:15 PM       Blood culture   Lab Results   Component Value Date/Time    BC 5 Days- no growth 11/10/2014 08:00 PM       Radiology:  [place] RETROPERITONEAL COMPLETE   Final Result   1. Diffuse irregular urinary bladder wall thickening up to 5.7 mm with debris   within the urinary bladder.   2. Unremarkable ultrasound of the kidneys.         CT ABDOMEN PELVIS W IV CONTRAST Additional Contrast? None   Final Result   1. Mild intrahepatic and extrahepatic ductal dilatation in post   cholecystectomy patient.   2. Small hiatal hernia.   3. Partially fluid filled normal caliber small bowel loops.  This finding is   nonspecific, but may be seen in enteritis in the appropriate clinical setting         XR CHEST PORTABLE   Final Result  No acute cardiopulmonary disease.             Medical Records/Labs/Images review:   I personally reviewed and summarized previous records   All labs and imaging were reviewed independently     ASSESSMENT & PLAN   Maria Valdez, a 57 y.o.-old female seen today for inpatient diabetes management     Diabetes Mellitus type 2  Patient's diabetes is uncontrolled with A1C 12.7  Glucose level is improving but still above  goal.  No hypoglycemia  For now, I recommend the following diabetes regimen  Lantus 20 units daily  Humalog 5 units three times daily with meals  Changed to medium-dose sliding scale ACHS  Continue glucose check with meals and at bedtime  Will titrate insulin dose based on the blood glucose trend & insulin requirement  With history of Graves' disease, I am concerning about the possibility of 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  Will obtain C-peptide and gad 65 antibody  The patient is an excellent candidate for an insulin pump.     Dietary noncompliance  Discussed with patient the importance of eating consistent carbohydrate meals, avoiding high glycemic index food. 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 cortisol to rule out adrenal insufficiency in this patient    Graves' disease  Thyroid function test on admission showed TSH 0.74, Free T4 1.3  Will continue methimazole 10 mg daily  Reviewed potential side effects of Methimazole, including agranulocytosis and liver dysfunction (cholestasis).     Acute pyelonephritis  Rocephin 1 g daily  Management per Primary team     Interdisciplinary plan for communication with healthcare providers:   Consult recommendations were discussed with the Primary Service/Nursing staff       The above issues were reviewed with the patient who understood and agreed with the plan.     Thank you for allowing [place] to participate in the care of this patient. Please do not hesitate to contact us with any additional questions.      Sherlean Foot, MD PGY-1   Endocrinology     I saw the patient and discussed the management with the resident physician Dr. Arlina Robes, Bjorn Loser, MD . I reviewed and agree with the findings and  plan as documented in the resident's note        Sharyn Blitz MD  Endocrinologist, [facility]   [address], [place] [address]   Phone: [phone]  Fax: [phone]  --------------------------------------------  An electronic signature was used to authenticate this note. Shelly Flatten, MD on 05/09/2023 at 12:55 PM

## 2023-05-09 NOTE — Progress Notes (Signed)
[facility] Progress Note    Admitting Date and Time: 05/07/2023 11:01 AM  Admit Dx: Hypokalemia [E87.6]  Hypomagnesemia [E83.42]  UTI (urinary tract infection) [N39.0]  Acute pyelonephritis [N10]  Nausea and vomiting, unspecified vomiting type [R11.2]    Subjective:  Patient is being followed for Hypokalemia [E87.6]  Hypomagnesemia [E83.42]  UTI (urinary tract infection) [N39.0]  Acute pyelonephritis [N10]  Nausea and vomiting, unspecified vomiting type [R11.2]   Pt was seen and examined today. Denies any new issues    ROS: denies fever, chills, cp, sob, n/v, HA unless stated above.     potassium phosphate IVPB (PERIPHERAL LINE)  20 mmol IntraVENous Once    insulin lispro  0-12 Units SubCUTAneous 4x Daily AC & HS    sennosides-docusate sodium  2 tablet Oral BID    insulin lispro  5 Units SubCUTAneous TID WC    nicotine  1 patch TransDERmal Daily    sodium chloride flush  5-40 mL IntraVENous 2 times per day    enoxaparin  30 mg SubCUTAneous Daily    cefTRIAXone (ROCEPHIN) IV  1,000 mg IntraVENous Q24H    insulin glargine  20 Units SubCUTAneous Daily    propranolol  120 mg Oral Daily    methIMAzole  15 mg Oral Daily     glucose, 4 tablet, PRN  dextrose bolus, 125 mL, PRN   Or  dextrose bolus, 250 mL, PRN  glucagon (rDNA), 1 mg, PRN  dextrose, , Continuous PRN  sodium chloride flush, 5-40 mL, PRN  sodium chloride, , PRN  polyethylene glycol, 17 g, Daily PRN  acetaminophen, 650 mg, Q6H PRN   Or  acetaminophen, 650 mg, Q6H PRN  prochlorperazine, 10 mg, Q8H PRN   Or  prochlorperazine, 10 mg, Q6H PRN         Objective:    BP 137/85   Pulse 85   Temp 98.1 F (36.7 C) (Oral)   Resp 16   Ht 1.575 m (5\' 2" )   Wt 45.6 kg (100 lb 8 oz)   SpO2 97%   BMI 18.38 kg/m     General Appearance: alert and oriented to person, place and time and in no acute distress  Skin: warm and dry  Head: normocephalic and atraumatic  Pulmonary/Chest: clear to auscultation bilaterally- no wheezes, rales or rhonchi, normal  air movement, no respiratory distress  Cardiovascular: normal rate, normal S1 and S2 and no carotid bruits  Abdomen: soft, tender over LLQ, non-distended, normal bowel sounds  Extremities: no cyanosis, no clubbing and no edema      Recent Labs     05/08/23  0315 05/08/23  1257 05/09/23  0624   NA 132 135 133   K 2.9* 3.5 3.3*   CL 94* 96* 97*   CO2 28 30* 27   BUN 4* 6 5*   CREATININE 0.4* 0.4* 0.3*   GLUCOSE 224* 221* 344*   CALCIUM 8.2* 8.5* 8.4*       Recent Labs     05/07/23  1226 05/08/23  0315 05/09/23  0624   WBC 16.5* 14.3* 12.8*   RBC 4.87 4.11 3.99   HGB 14.2 11.9 11.5   HCT 41.6 36.1 35.5   MCV 85.4 87.8 89.0   MCH 29.2 29.0 28.8   MCHC 34.1 33.0 32.4   RDW 12.9 12.9 12.9   PLT 639* 553* 562*   MPV 8.8 9.3 9.1       Radiology:   [place] RETROPERITONEAL COMPLETE   Final  Result   1. Diffuse irregular urinary bladder wall thickening up to 5.7 mm with debris   within the urinary bladder.   2. Unremarkable ultrasound of the kidneys.         CT ABDOMEN PELVIS W IV CONTRAST Additional Contrast? None   Final Result   1. Mild intrahepatic and extrahepatic ductal dilatation in post   cholecystectomy patient.   2. Small hiatal hernia.   3. Partially fluid filled normal caliber small bowel loops.  This finding is   nonspecific, but may be seen in enteritis in the appropriate clinical setting         XR CHEST PORTABLE   Final Result   No acute cardiopulmonary disease.             Assessment:    Principal Problem:    UTI (urinary tract infection)  Active Problems:    Hypokalemia    Hypomagnesemia    Poorly controlled diabetes mellitus (HCC)    Dietary noncompliance    Hyperthyroidism    Refeeding syndrome  Resolved Problems:    * No resolved hospital problems. *      Plan:  Continue Rocephin  Follow cultures  Replace electrolytes again today  Recheck electrolytes in the afternoon, likely refeeding syndrome  LR 50 mL/h  Endocrinology consulted, appreciate recommendations  Had SLP evaluation, no dysphagia, questionable  esophageal dysmotility  Continue home medications    Code Status: Full code  DVT/GI ppx: Lovenox/Diet  Dispo: Continue care, plan for discharge tomorrow around noon    Time spent reviewing chart, clinical exam, discussing case and answering questions with staff/consultants/patient/family = 50 min    NOTE: This report was transcribed using voice recognition software. Every effort was made to ensure accuracy; however, inadvertent computerized transcription errors may be present.  Electronically signed by Aletha Halim, MD on 05/09/2023 at 1:27 PM

## 2023-05-09 NOTE — Progress Notes (Signed)
Perfect served NP Dia Sitter Mcdonald updated her on magnesium and potassium levels. Orders to replace both.

## 2023-05-09 NOTE — Progress Notes (Addendum)
SPEECH/LANGUAGE PATHOLOGY  CLINICAL ASSESSMENT OF SWALLOWING FUNCTION   and PLAN OF CARE      PATIENT NAME:  Maria Valdez  (female)     MRN:  [id number]    DOB:  [birth year]  (56 y.o.)  STATUS:  Inpatient: Room 0538/0538-A    TODAY'S DATE:  05/09/2023  ORDER DATE, DESCRIPTION AND REFERRING PROVIDER:  Dr. Legrand Rams  REASON FOR REFERRAL: Evaluate swallowing function    EVALUATING THERAPIST: Daphene Jaeger, SLP                 ASSESSMENT:    DYSPHAGIA DIAGNOSIS:   normal swallow function; questionable esophageal motility concerns      DIET RECOMMENDATIONS:  Regular consistency solids (IDDSI level 7) with  thin liquids (IDDSI level 0)     FEEDING RECOMMENDATIONS:     Assistance level:  No assistance needed      Compensatory strategies recommended: No strategies are recommended at this time      Discussed recommendations with:  patient nurse in person    SPEECH THERAPY  PLAN OF CARE   The dysphagia POC is established based on physician order, dysphagia diagnosis and results of clinical assessment     Dysphagia therapy is not recommended     Conditions Requiring Skilled Therapeutic Intervention for dysphagia:    not applicable    Specific dysphagia interventions to include:     Not applicable    Specific instructions for next treatment:  not applicable   Patient Treatment Goals:    Short Term Goals:  Not applicable no therapy warranted     Long Term Goals:   Not applicable no therapy warranted      Patient/family Goal:    not applicable                    ADMITTING DIAGNOSIS: Hypokalemia [E87.6]  Hypomagnesemia [E83.42]  UTI (urinary tract infection) [N39.0]  Acute pyelonephritis [N10]  Nausea and vomiting, unspecified vomiting type [R11.2]    VISIT DIAGNOSIS:   Visit Diagnoses         Codes    Nausea and vomiting, unspecified vomiting type     R11.2             PATIENT REPORT/COMPLAINT: food sticking in the esophagus  nursing not available at time of evaluation chart reviewed and patient is not NPO for any procedures  per current documentation.     PRIOR LEVEL OF SWALLOW FUNCTION:    PAST HISTORY OF DYSPHAGIA?: none reported    Home diet: Regular consistency solids (IDDSI level 7) with  thin liquids (IDDSI level 0)    Current Diet Order:  ADULT DIET; Regular; 4 carb choices (60 gm/meal)    PROCEDURE:  Consistencies Administered During the Evaluation   Liquids: thin liquid   Solids:  pureed foods and soft solid foods      Method of Intake:   cup, straw, spoon  Self fed      Position:   Seated, upright    CLINICAL ASSESSMENT  Oral Stage:       Patient reports poor dentition, which may impact mastication.     Pharyngeal Stage:    No signs of aspiration were noted during this evaluation however, silent aspiration cannot be ruled out at bedside.  If silent aspiration is suspected, a Videofluoroscopic Study of Swallowing (MBS) is recommended and requires a physician order.    Cognition:   Within functional limits for this exam    Oral  Peripheral Examination   Adequate lingual/labial strength     Current Respiratory Status    room air     Parameters of Speech Production  Respiration:  Adequate for speech production  Quality:   Within functional limits  Intensity: Within functional limits    Volitional Swallow: Present    Volitional Cough:    Present    Pain: No pain reported.     EDUCATION:   The Warehouse manager (SLP) completed education regarding results of evaluation and that intervention is not warranted at this time.  Learner: Patient  Education:  Reviewed results and recommendations of this evaluation  Evaluation of Education: Verbalizes understanding    This plan will be re-evaluated and revised in 1 week  if warranted.      CPT code:  16109  bedside swallow eval      [x] The admitting diagnosis and active problem list, have been reviewed prior to initiation of this evaluation.        ACTIVE PROBLEM LIST:   Patient Active Problem List   Diagnosis    AMA (advanced maternal age) multigravida 35+    Asymptomatic human  immunodeficiency virus (HIV) infection status (HCC)    Viral disease complicating pregnancy, antepartum    Tobacco use disorder affecting pregnancy, antepartum    Suspected damage to fetus from drugs( combivir, Kaletra, and levaquin) affecting management of mother, antepartum    Obesity complicating pregnancy    HTN (hypertension)    UTI (urinary tract infection)    Hypokalemia    Hypomagnesemia    Poorly controlled diabetes mellitus (HCC)    Dysphagia    Dietary noncompliance    Hyperthyroidism    Refeeding syndrome      INTERVENTION  Doctor, general practice (SLP) completed education with the patient/family regarding type of swallowing impairment. Reviewed current solid/liquid consistency diet recommendations and discussed compensatory strategies to ensure safe PO intake. Reviewed aspiration precautions. Encouraged patient and/or family to engage SLP in unstructured Q&A session relative to identified deficit areas; indicated understanding of all information provided via satisfactory verbal response.    CPT Code: 60454  dysphagia tx         Arville Lime  Speech Pathology Graduate Student    Daphene Jaeger, M.S. CCC-SLP/L  Speech Language Pathologist  [phone]

## 2023-05-09 NOTE — Plan of Care (Signed)
Problem: ABCDS Injury Assessment  Goal: Absence of physical injury  Outcome: Progressing

## 2023-05-10 LAB — COMPREHENSIVE METABOLIC PANEL W/ REFLEX TO MG FOR LOW K
ALT: 6 U/L (ref 0–32)
AST: 15 U/L (ref 0–31)
Albumin: 2.7 g/dL — ABNORMAL LOW (ref 3.5–5.2)
Alkaline Phosphatase: 153 U/L — ABNORMAL HIGH (ref 35–104)
Anion Gap: 8 mmol/L (ref 7–16)
BUN: 5 mg/dL — ABNORMAL LOW (ref 6–20)
CO2: 26 mmol/L (ref 22–29)
Calcium: 8.9 mg/dL (ref 8.6–10.2)
Chloride: 102 mmol/L (ref 98–107)
Creatinine: 0.4 mg/dL — ABNORMAL LOW (ref 0.50–1.00)
Est, Glom Filt Rate: >90 mL/min/{1.73_m2} (ref >60)
Glucose: 287 mg/dL — ABNORMAL HIGH (ref 74–99)
Potassium: 4.5 mmol/L (ref 3.5–5.0)
Sodium: 136 mmol/L (ref 132–146)
Total Bilirubin: <0.2 mg/dL (ref 0.0–1.2)
Total Protein: 5.8 g/dL — ABNORMAL LOW (ref 6.4–8.3)

## 2023-05-10 LAB — CBC WITH AUTO DIFFERENTIAL
Basophils %: 1 % (ref 0.0–2.0)
Basophils Absolute: 0.05 10*3/uL (ref 0.00–0.20)
Eosinophils %: 1 % (ref 0–6)
Eosinophils Absolute: 0.11 10*3/uL (ref 0.05–0.50)
Hematocrit: 33.4 % — ABNORMAL LOW (ref 34.0–48.0)
Hemoglobin: 10.9 g/dL — ABNORMAL LOW (ref 11.5–15.5)
Immature Granulocytes %: 1 % (ref 0.0–5.0)
Immature Granulocytes Absolute: 0.1 10*3/uL (ref 0.00–0.58)
Lymphocytes %: 24 % (ref 20.0–42.0)
Lymphocytes Absolute: 2.3 10*3/uL (ref 1.50–4.00)
MCH: 29.2 pg (ref 26.0–35.0)
MCHC: 32.6 g/dL (ref 32.0–34.5)
MCV: 89.5 fL (ref 80.0–99.9)
MPV: 9 fL (ref 7.0–12.0)
Monocytes %: 5 % (ref 2.0–12.0)
Monocytes Absolute: 0.49 10*3/uL (ref 0.10–0.95)
Neutrophils %: 68 % (ref 43.0–80.0)
Neutrophils Absolute: 6.57 10*3/uL (ref 1.80–7.30)
Platelets: 591 10*3/uL — ABNORMAL HIGH (ref 130–450)
RBC: 3.73 m/uL (ref 3.50–5.50)
RDW: 13 % (ref 11.5–15.0)
WBC: 9.6 10*3/uL (ref 4.5–11.5)

## 2023-05-10 LAB — CULTURE, URINE

## 2023-05-10 LAB — MAGNESIUM: Magnesium: 1.6 mg/dL (ref 1.6–2.6)

## 2023-05-10 LAB — POCT GLUCOSE
POC Glucose: 214 mg/dL — ABNORMAL HIGH (ref 74–99)
POC Glucose: 314 mg/dL — ABNORMAL HIGH (ref 74–99)

## 2023-05-10 LAB — CORTISOL TOTAL: Cortisol: 11.6 ug/dL (ref 2.7–18.4)

## 2023-05-10 MED ORDER — POTASSIUM BICARB-CITRIC ACID 20 MEQ PO TBEF
20 | ORAL | Status: DC | PRN
Start: 2023-05-10 — End: 2023-05-10

## 2023-05-10 MED ORDER — POTASSIUM CHLORIDE CRYS ER 20 MEQ PO TBCR
20 | ORAL | Status: DC | PRN
Start: 2023-05-10 — End: 2023-05-10

## 2023-05-10 MED ORDER — MAGNESIUM SULFATE 2000 MG/50 ML IVPB PREMIX
2 | INTRAVENOUS | Status: DC | PRN
Start: 2023-05-10 — End: 2023-05-10

## 2023-05-10 MED ORDER — CEFDINIR 300 MG PO CAPS
300 | ORAL_CAPSULE | Freq: Every day | ORAL | 0 refills | Status: AC
Start: 2023-05-10 — End: 2023-05-14

## 2023-05-10 MED ORDER — INSULIN GLARGINE 100 UNIT/ML SC SOLN
100 | Freq: Every day | SUBCUTANEOUS | Status: DC
Start: 2023-05-10 — End: 2023-05-10

## 2023-05-10 MED ORDER — INSULIN LISPRO 100 UNIT/ML IJ SOLN
100 | Freq: Three times a day (TID) | INTRAMUSCULAR | Status: DC
Start: 2023-05-10 — End: 2023-05-10

## 2023-05-10 MED ORDER — POTASSIUM CHLORIDE CRYS ER 20 MEQ PO TBCR
20 | Freq: Once | ORAL | Status: AC
Start: 2023-05-10 — End: 2023-05-09
  Administered 2023-05-10: 02:00:00 40 meq via ORAL

## 2023-05-10 MED ORDER — POTASSIUM CHLORIDE 10 MEQ/100ML IV SOLN
10 | INTRAVENOUS | Status: DC | PRN
Start: 2023-05-10 — End: 2023-05-10

## 2023-05-10 MED ORDER — MAGNESIUM SULFATE 2000 MG/50 ML IVPB PREMIX
2 | Freq: Once | INTRAVENOUS | Status: AC
Start: 2023-05-10 — End: 2023-05-09
  Administered 2023-05-10: 02:00:00 2000 mg via INTRAVENOUS

## 2023-05-10 MED FILL — DOK PLUS 50-8.6 MG PO TABS: ORAL | Qty: 2

## 2023-05-10 MED FILL — METHIMAZOLE 5 MG PO TABS: 5 MG | ORAL | Qty: 2

## 2023-05-10 MED FILL — MAPAP 325 MG PO TABS: 325 MG | ORAL | Qty: 2

## 2023-05-10 MED FILL — MAGNESIUM SULFATE 2 GM/50ML IV SOLN: 2 GM/50ML | INTRAVENOUS | Qty: 50

## 2023-05-10 MED FILL — NICOTINE 14 MG/24HR TD PT24: 14 MG/24HR | TRANSDERMAL | Qty: 1

## 2023-05-10 MED FILL — POTASSIUM CHLORIDE CRYS ER 20 MEQ PO TBCR: 20 MEQ | ORAL | Qty: 2

## 2023-05-10 MED FILL — LOVENOX 30 MG/0.3ML IJ SOSY: 30 MG/0.3ML | INTRAMUSCULAR | Qty: 0.3

## 2023-05-10 MED FILL — METHIMAZOLE 10 MG PO TABS: 10 MG | ORAL | Qty: 1

## 2023-05-10 MED FILL — PROPRANOLOL HCL ER 60 MG PO CP24: 60 MG | ORAL | Qty: 2

## 2023-05-10 NOTE — Progress Notes (Signed)
ENDOCRINOLOGY PROGRESS NOTE    Date of Service: 05/10/2023  Date of Admission: 05/07/2023  Admitting Physician: Lazarus Salines, MD   Primary Care Physician: Clovis Pu, DO  Consultant physician: Sharyn Blitz MD     Reason for the consultation:  Uncontrolled DM, hyperthyroidism     History of Present Illness:  The history is provided by the patient. Accuracy of the patient data is excellent.    Maria Valdez is a very pleasant 57 y.o. old female female with PMH of HTN, hyperthyroidism, type 2 diabetes mellitus, recurrent UTI, and other listed below admitted to [facility] on 05/07/2023 because of nausea and vomiting, endocrine service was consulted for diabetes management.     Subjective:  The patient was seen today, no acute events overnight, glucose level still running high.  No hypoglycemia.    Inpatient diet:   Carb Restricted diet     Point of care glucose monitoring (Independently reviewed)   Recent Labs     05/08/23  1100 05/08/23  1650 05/08/23  1959 05/09/23  0621 05/09/23  1115 05/09/23  1604 05/09/23  2017 05/10/23  0523   POCGLU 278* 355* 242* 324* 196* 285* 214* 314*       Scheduled Meds:   insulin lispro  6 Units SubCUTAneous TID WC    [START ON 05/11/2023] insulin glargine  26 Units SubCUTAneous Daily    insulin lispro  0-12 Units SubCUTAneous 4x Daily AC & HS    methIMAzole  10 mg Oral Daily    sennosides-docusate sodium  2 tablet Oral BID    nicotine  1 patch TransDERmal Daily    sodium chloride flush  5-40 mL IntraVENous 2 times per day    enoxaparin  30 mg SubCUTAneous Daily    cefTRIAXone (ROCEPHIN) IV  1,000 mg IntraVENous Q24H    propranolol  120 mg Oral Daily     PRN Meds:   potassium chloride, 40 mEq, PRN   Or  potassium alternative oral replacement, 40 mEq, PRN   Or  potassium chloride, 10 mEq, PRN  magnesium sulfate, 2,000 mg, PRN  glucose, 4 tablet, PRN  dextrose bolus, 125 mL, PRN   Or  dextrose bolus, 250 mL, PRN  glucagon (rDNA), 1 mg, PRN  dextrose, , Continuous PRN  sodium  chloride flush, 5-40 mL, PRN  sodium chloride, , PRN  polyethylene glycol, 17 g, Daily PRN  acetaminophen, 650 mg, Q6H PRN   Or  acetaminophen, 650 mg, Q6H PRN  prochlorperazine, 10 mg, Q8H PRN   Or  prochlorperazine, 10 mg, Q6H PRN      Continuous Infusions:   lactated ringers IV soln 50 mL/hr at 05/09/23 0612    dextrose      sodium chloride         Review of Systems  All systems reviewed. All negative except for symptoms mentioned in HPI     OBJECTIVE    BP 138/81   Pulse 93   Temp 98.2 F (36.8 C) (Oral)   Resp 18   Ht 1.575 m (5\' 2" )   Wt 45.6 kg (100 lb 8 oz)   SpO2 98%   BMI 18.38 kg/m   No intake or output data in the 24 hours ending 05/10/23 0955      Physical examination:  General: awake alert, oriented x3  HEENT: exophthalmos, normocephalic non traumatic  Neck: supple, left thyroid palpable  Pulm: good equal air entry no added sounds  CVS: S1 + S2  Abd: soft lax,  no tenderness  Skin: warm, skin laxity present  Neuro: CN intact, sensation decreased bilateral , muscle power normal  Psych: normal mood, and affect    Review of Laboratory Data:  I have reviewed the following:  Recent Labs     05/08/23  0315 05/09/23  0624 05/10/23  0530   WBC 14.3* 12.8* 9.6   RBC 4.11 3.99 3.73   HGB 11.9 11.5 10.9*   HCT 36.1 35.5 33.4*   MCV 87.8 89.0 89.5   MCH 29.0 28.8 29.2   MCHC 33.0 32.4 32.6   RDW 12.9 12.9 13.0   PLT 553* 562* 591*   MPV 9.3 9.1 9.0     Recent Labs     05/08/23  0315 05/08/23  1257 05/09/23  0624 05/09/23  1745 05/10/23  0530   NA 132   < > 133 135 136   K 2.9*   < > 3.3* 3.3* 4.5   CL 94*   < > 97* 97* 102   CO2 28   < > 27 26 26    BUN 4*   < > 5* 5* 5*   CREATININE 0.4*   < > 0.3* 0.3* 0.4*   GLUCOSE 224*   < > 344* 284* 287*   CALCIUM 8.2*   < > 8.4* 8.6 8.9   BILITOT 0.3  --  0.2  --  <0.2   ALKPHOS 163*  --  164*  --  153*   AST 10  --  10  --  15   ALT <5  --  <5  --  6    < > = values in this interval not displayed.     Beta-Hydroxybutyrate   Date Value Ref Range Status   05/07/2023  0.53 (H) 0.02 - 0.27 mmol/L Final     Lab Results   Component Value Date/Time    LABA1C 12.7 05/08/2023 03:15 AM     Lab Results   Component Value Date/Time    TSH 0.74 05/08/2023 03:15 AM    T4FREE 1.3 05/08/2023 03:15 AM    T3TOTAL 88 05/08/2023 03:15 AM     Lab Results   Component Value Date/Time    LABA1C 12.7 05/08/2023 03:15 AM    GLUCOSE 287 05/10/2023 05:30 AM    GLUCOSE 90 09/24/2010 11:30 AM     Lab Results   Component Value Date/Time    TRIG 108 05/21/2012 03:15 PM    HDL 67.0 05/21/2012 03:15 PM    CHOL 169 05/21/2012 03:15 PM       Blood culture   Lab Results   Component Value Date/Time    BC 5 Days- no growth 11/10/2014 08:00 PM       Radiology:  [place] RETROPERITONEAL COMPLETE   Final Result   1. Diffuse irregular urinary bladder wall thickening up to 5.7 mm with debris   within the urinary bladder.   2. Unremarkable ultrasound of the kidneys.         CT ABDOMEN PELVIS W IV CONTRAST Additional Contrast? None   Final Result   1. Mild intrahepatic and extrahepatic ductal dilatation in post   cholecystectomy patient.   2. Small hiatal hernia.   3. Partially fluid filled normal caliber small bowel loops.  This finding is   nonspecific, but may be seen in enteritis in the appropriate clinical setting         XR CHEST PORTABLE   Final Result   No acute cardiopulmonary disease.  Medical Records/Labs/Images review:   I personally reviewed and summarized previous records   All labs and imaging were reviewed independently     ASSESSMENT & PLAN   Maria Valdez, a 57 y.o.-old female seen today for inpatient diabetes management     Diabetes Mellitus type 2  Patient's diabetes is uncontrolled with A1C 12.7  For now, I recommend the following diabetes regimen  Lantus 26 units daily  Humalog 6 units three times daily with meals  Medium dose sliding scale ACHS  Continue glucose check with meals and at bedtime  Will titrate insulin dose based on the blood glucose trend & insulin requirement  With history of  Graves' disease, I am concerning about the possibility of 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 C-peptide and gad 65 antibody in process  The patient is an excellent candidate for an insulin pump.     Dietary noncompliance  Discussed with patient the importance of eating consistent carbohydrate meals, avoiding high glycemic index food. 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 cortisol to rule out adrenal insufficiency in this patient  Cortisol level from this morning still pending    Graves' disease  Thyroid function test on admission showed TSH 0.74, Free T4 1.3  Will continue methimazole 10 mg daily  Reviewed potential side effects of Methimazole, including agranulocytosis and liver dysfunction (cholestasis).     Acute pyelonephritis  Rocephin 1 g daily  Management per Primary team     Interdisciplinary plan for communication with healthcare providers:   Consult recommendations were discussed with the Primary Service/Nursing staff       The above issues were reviewed with the patient who understood and agreed with the plan.     Thank you for allowing [place] to participate in the care of this patient. Please do not hesitate to contact us with any additional questions.     Sharyn Blitz MD  Endocrinologist, [facility]   [address], [place] [address]   Phone: [phone]  Fax: [phone]  --------------------------------------------  An electronic signature was used to authenticate this note. Shelly Flatten, MD on 05/10/2023 at 9:55 AM

## 2023-05-10 NOTE — Plan of Care (Signed)
Problem: Chronic Conditions and Co-morbidities  Goal: Patient's chronic conditions and co-morbidity symptoms are monitored and maintained or improved  05/10/2023 0919 by Chloe Baig, RN  Outcome: Progressing     Problem: Discharge Planning  Goal: Discharge to home or other facility with appropriate resources  05/10/2023 0919 by Zaden Sako, RN  Outcome: Progressing     Problem: Pain  Goal: Verbalizes/displays adequate comfort level or baseline comfort level  05/10/2023 0919 by Destina Mantei, RN  Outcome: Progressing     Problem: ABCDS Injury Assessment  Goal: Absence of physical injury  05/10/2023 0919 by Cherisa Brucker, RN  Outcome: Progressing

## 2023-05-10 NOTE — Plan of Care (Signed)
Problem: Chronic Conditions and Co-morbidities  Goal: Patient's chronic conditions and co-morbidity symptoms are monitored and maintained or improved  Outcome: Progressing  Flowsheets (Taken 05/10/2023 0308)  Care Plan - Patient's Chronic Conditions and Co-Morbidity Symptoms are Monitored and Maintained or Improved: Monitor and assess patient's chronic conditions and comorbid symptoms for stability, deterioration, or improvement     Problem: Discharge Planning  Goal: Discharge to home or other facility with appropriate resources  Outcome: Progressing  Flowsheets (Taken 05/10/2023 0308)  Discharge to home or other facility with appropriate resources:   Identify barriers to discharge with patient and caregiver   Arrange for needed discharge resources and transportation as appropriate   Identify discharge learning needs (meds, wound care, etc)   Refer to discharge planning if patient needs post-hospital services based on physician order or complex needs related to functional status, cognitive ability or social support system     Problem: Pain  Goal: Verbalizes/displays adequate comfort level or baseline comfort level  Outcome: Progressing     Problem: ABCDS Injury Assessment  Goal: Absence of physical injury  05/10/2023 0310 by Arabella Merles, RN  Outcome: Progressing  05/09/2023 1405 by Early Chars, RN  Outcome: Progressing

## 2023-05-10 NOTE — Discharge Summary (Signed)
[facility] Physician Discharge Summary       Gilman Buttner Roberto Scales, MD  [address]  [address]  [place] [address]  [phone]    Follow up      [facility]  [address]  [place] [address]  [phone]  Schedule an appointment as soon as possible for a visit in 1 week(s)  Hospital Follow up      Activity level: As tolerated     Dispo: Home      Condition on discharge: Stable     Patient ID:  Maria Valdez  [id number]  57 y.o.  03-02-1966    Admit date: 05/07/2023    Discharge date and time:  05/10/2023  11:16 AM    Admission Diagnoses: Principal Problem:    UTI (urinary tract infection)  Active Problems:    Hypokalemia    Hypomagnesemia    Poorly controlled diabetes mellitus (HCC)    Dietary noncompliance    Hyperthyroidism    Refeeding syndrome  Resolved Problems:    * No resolved hospital problems. *      Discharge Diagnoses: Principal Problem:    UTI (urinary tract infection)  Active Problems:    Hypokalemia    Hypomagnesemia    Poorly controlled diabetes mellitus (HCC)    Dietary noncompliance    Hyperthyroidism    Refeeding syndrome  Resolved Problems:    * No resolved hospital problems. *      Consults:  IP CONSULT TO HOSPITALIST  IP CONSULT TO ENDOCRINOLOGY    Procedures:     Hospital Course:   Patient Maria Valdez is a 57 y.o. with PMHx HTN, thyroid disease, HIV infection, DM, tobacco use who presented with Hypokalemia [E87.6]  Hypomagnesemia [E83.42]  UTI (urinary tract infection) [N39.0]  Acute pyelonephritis [N10]  Nausea and vomiting, unspecified vomiting type [R11.2]  Patient presented with nausea and vomiting, she had been recently treated for UTI as an outpatient without success, continues to have symptoms, UA consistent with UTI, urine culture was taken which grew E. coli that was pansensitive, she was initially treated with Rocephin and will be transition to cefdinir on discharge.  She mentions [place] and she wishes to establish with someone closer by, she was seen here by Dr. Gilman Buttner and will be referred to his office on discharge.  She also needs a primary care doctor, her previous primary care doctor is not around anymore, she will be provided a number to call to schedule an appointment.  She was discharged home in stable condition.     Discharge Exam:    General Appearance: alert and oriented to person, place and time and in no acute distress  Skin: warm and dry  Head: normocephalic and atraumatic  Pulmonary/Chest: clear to auscultation bilaterally- no wheezes, rales or rhonchi, normal air movement, no respiratory distress  Cardiovascular: normal rate, normal S1 and S2 and no carotid bruits  Abdomen: soft, tender over LLQ, non-distended, normal bowel sounds  Extremities: no cyanosis, no clubbing and no edema    I/O last 3 completed shifts:  In: 1006.5 [I.V.:1006.5]  Out: -   No intake/output data recorded.      LABS:  Recent Labs     05/09/23  0624 05/09/23  1745 05/10/23  0530   NA 133 135 136   K 3.3* 3.3* 4.5   CL 97* 97* 102   CO2 27 26 26    BUN  5* 5* 5*   CREATININE 0.3* 0.3* 0.4*   GLUCOSE 344* 284* 287*   CALCIUM 8.4* 8.6 8.9       Recent Labs     05/08/23  0315 05/09/23  0624 05/10/23  0530   WBC 14.3* 12.8* 9.6   RBC 4.11 3.99 3.73   HGB 11.9 11.5 10.9*   HCT 36.1 35.5 33.4*   MCV 87.8 89.0 89.5   MCH 29.0 28.8 29.2   MCHC 33.0 32.4 32.6   RDW 12.9 12.9 13.0   PLT 553* 562* 591*   MPV 9.3 9.1 9.0       Recent Labs     05/09/23  1115 05/09/23  1604 05/09/23  2017 05/10/23  0523   POCGLU 196* 285* 214* 314*       Imaging:  [place] RETROPERITONEAL COMPLETE    Result Date: 05/07/2023  EXAMINATION: RETROPERITONEAL ULTRASOUND OF THE KIDNEYS AND URINARY BLADDER 05/07/2023 COMPARISON: None HISTORY: ORDERING SYSTEM PROVIDED HISTORY: recurrent UTI TECHNOLOGIST PROVIDED HISTORY: Reason for exam:->recurrent UTI What reading provider will be dictating this exam?->CRC FINDINGS: Kidneys: The right kidney  measures 12.7 cm in length and the left kidney measures 12.3 cm in length. Kidneys demonstrate normal cortical echogenicity.  No evidence of hydronephrosis or intrarenal stones. Bladder: Diffuse 2 irregular urinary bladder wall thickening up to 5.7 mm with debris within the urinary bladder     1. Diffuse irregular urinary bladder wall thickening up to 5.7 mm with debris within the urinary bladder. 2. Unremarkable ultrasound of the kidneys.     CT ABDOMEN PELVIS W IV CONTRAST Additional Contrast? None    Result Date: 05/07/2023  EXAMINATION: CT OF THE ABDOMEN AND PELVIS WITH CONTRAST 05/07/2023 2:47 pm TECHNIQUE: CT of the abdomen and pelvis was performed with the administration of intravenous contrast. Multiplanar reformatted images are provided for review. Automated exposure control, iterative reconstruction, and/or weight based adjustment of the mA/kV was utilized to reduce the radiation dose to as low as reasonably achievable. COMPARISON: None. HISTORY: ORDERING SYSTEM PROVIDED HISTORY: abd pain, vomiting TECHNOLOGIST PROVIDED HISTORY: Reason for exam:->abd pain, vomiting Additional Contrast?->None Decision Support Exception - unselect if not a suspected or confirmed emergency medical condition->Emergency Medical Condition (MA) FINDINGS: Lower Chest: Normal visualized lung bases. Cardiac size normal. No pericardial effusion. No coronary artery calcification.  Calcification mitral valve versus mitral valve annulus. Organs: Cranial caudal dimension of liver 19 cm.  Normal in contour and attenuation. Mild intrahepatic ductal dilatation.  Common duct measures 10 mm at level right hepatic artery, and portal vein. Gallbladder surgically absent. Spleen is normal in size.No splenic masses or calcification identified. Normal pancreas. No evidence of ductal dilatation. Normal adrenal glands. Kidneys normal in size. Kidneys enhance symmetrically and uniformly. No renal calculi.No hydronephrosis. GI/Bowel: Small hiatal  hernia.  Several normal caliber fluid-filled small bowel loops identified. Normal in caliber. Appendix without anomaly.. Pelvis: Normal bladder. No pelvic masses. Peritoneum/Retroperitoneum No free fluid, free air, organized fluid collection. Aorta is normal in course and caliber. No lymph node enlargement. Ureters normal in course and caliber. Soft tissues: No hernia identified. No edema or masses. Bones: No osteoblastic, and no osteolytic lesions. Posterior disc space narrowing, L5-S1.  Remote avulsion chip fracture versus limbus deformity posterior endplate S1. No postoperative changes. No fractures.     1. Mild intrahepatic and extrahepatic ductal dilatation in post cholecystectomy patient. 2. Small hiatal hernia. 3. Partially fluid filled normal caliber small bowel loops.  This finding is nonspecific, but may be seen in enteritis  in the appropriate clinical setting     XR CHEST PORTABLE    Result Date: 05/07/2023  EXAMINATION: ONE XRAY VIEW OF THE CHEST 05/07/2023 1:26 pm COMPARISON: None. HISTORY: ORDERING SYSTEM PROVIDED HISTORY: Chest Pain TECHNOLOGIST PROVIDED HISTORY: Reason for exam:->Chest Pain FINDINGS: Portable chest reveals cardiac and mediastinal silhouettes within normal limits.  The lung fields are grossly clear.  No focal parenchymal opacification present.  No pleural effusion or pneumothorax.  The bony structures reveal minimal multilevel degenerative changes seen within the fine.  Pulmonary vascularity is within normal limits.     No acute cardiopulmonary disease.       Patient Instructions:      Medication List        START taking these medications      cefdinir 300 MG capsule  Commonly known as: OMNICEF  Take 2 capsules by mouth daily for 4 days            CONTINUE taking these medications      Genvoya 150-150-200-10 MG TABLET  Generic drug: elvitegravir-cobicistat-emtricitabine-tenofovir alafenamide     Inderal LA 120 MG extended release capsule  Generic drug: propranolol     insulin glargine  100 UNIT/ML injection vial  Commonly known as: LANTUS     metFORMIN 1000 MG tablet  Commonly known as: GLUCOPHAGE     methIMAzole 10 MG tablet  Commonly known as: TAPAZOLE            STOP taking these medications      dicyclomine 10 MG capsule  Commonly known as: Bentyl     famotidine 20 MG tablet  Commonly known as: Pepcid               Where to Get Your Medications        These medications were sent to [id number] - [place], [place] - [address] - P [phone] - F [phone]  [address], [place] [address]      Phone: [phone]   cefdinir 300 MG capsule           Note that 35 minutes were spent in preparing discharge papers, discussing discharge with patient, medication review, etc.    Signed:  Electronically signed by Aletha Halim, MD on 05/10/2023 at 11:16 AM

## 2023-05-10 NOTE — Discharge Instructions (Signed)
Take Cefdinir as prescribed, take the first dose today. Resume your other home medications.    Please make sure to establish and follow up with a primary care doctor and endocrinology.

## 2023-05-10 NOTE — Progress Notes (Signed)
Physician Progress Note      PATIENT:               WANDER, GENSON  CSN #:                  [id number]  DOB:                       Feb 29, 1966  ADMIT DATE:       05/07/2023 11:01 AM  DISCH DATE:        05/10/2023 1:04 PM  RESPONDING  PROVIDER #:        Morrie Sheldon Jazline Cumbee MD          QUERY TEXT:    Pt admitted with UTI. Pt noted to have Wbc-16.5,14.3 HR-98,102 CRP-124   Procalcitonin-0.65. If possible, please document in the progress notes and   discharge summary if you are evaluating and /or treating any of the following:  The medical record reflects the following:  Risk Factors: UTI,DM,HTN  Clinical Indicators:H&P on 10/23  UTI- UA and UC ordered. Follow cultures.   Rocephin daily. Procal and CRP ordered.  Urine culture on 10/23  ESCHERICHIA COLI >100,000 CFU/ML Abnormal  Wbc-16.5(10/23),14.3(10/24),12.8(10/25)  HR-98,93,91,102  RR-23,20  Procalcitonin-0.65(10/24)  CRP-124(10/24)    Treatment: IV Rocephin,Urine culture,IVF,serial labs    Thank you,  [facility]  Options provided:  -- Sepsis due to UTI   present on admission  -- UTI  without Sepsis  -- Other - I will add my own diagnosis  -- Disagree - Not applicable / Not valid  -- Disagree - Clinically unable to determine / Unknown  -- Refer to Clinical Documentation Reviewer    PROVIDER RESPONSE TEXT:    This patient has Sepsis due to UTI present on admission    Query created by: Hart Robinsons on 05/12/2023 3:23 AM      Electronically signed by:  Marcene Brawn MD 05/12/2023 5:54 PM

## 2023-05-13 LAB — ACTH: ACTH: 21 pg/mL (ref 7–63)

## 2023-05-13 LAB — C-PEPTIDE: C-Peptide: <0.1 ng/mL — ABNORMAL LOW (ref 1.1–4.4)

## 2023-05-15 LAB — GLUTAMIC ACID DECARBOXYLASE: Glutamic Acid Decarb Ab: >250 [IU]/mL — ABNORMAL HIGH (ref 0.0–5.0)

## 2023-05-16 NOTE — Telephone Encounter (Signed)
Notify patient  As expected, your blood work confirm that you have late onset type 1 diabetes and you need to be on insulin all the time.  Metformin is not going to work in your case because your C-peptide is completely undetectable.    Please check if she was approved for insulin pump.  If not, we will need to add short acting insulin to the Lantus.    Please let me know

## 2023-05-21 NOTE — Telephone Encounter (Signed)
Stop metformin and Trulicity  Decrease Lantus to 15 units daily  Started Humalog 4 units 3 times daily with meals plus low-dose sliding scale  Please send a prescription for short acting insulin to her pharmacy

## 2023-05-21 NOTE — Telephone Encounter (Signed)
Patient has an apt 11/25 does not want to come in any early   she really understand, she would like if you can call her, I will check on the pump    Humalog sliding scale  low sliding scale  Lantus 24 unit daily  Trulicity once a week  Metformin 1000 mg        Please advise

## 2023-05-22 NOTE — Telephone Encounter (Signed)
Spoke to patient she understood and she dont need any refill

## 2023-06-09 ENCOUNTER — Encounter: Admit: 2023-06-09 | Payer: PRIVATE HEALTH INSURANCE | Admitting: Clinical Nurse Specialist | Primary: Family Medicine

## 2023-06-09 VITALS — BP 167/93 | HR 70 | Ht 62.0 in | Wt 102.0 lb

## 2023-06-09 DIAGNOSIS — E1065 Type 1 diabetes mellitus with hyperglycemia: Principal | ICD-10-CM

## 2023-06-09 MED ORDER — HUMALOG KWIKPEN 100 UNIT/ML SC SOPN
100 | SUBCUTANEOUS | 4 refills | 37.00 days | Status: DC
Start: 2023-06-09 — End: 2023-11-19

## 2023-06-09 MED ORDER — INSULIN GLARGINE 100 UNIT/ML SC SOLN
100 | Freq: Every day | SUBCUTANEOUS | 4 refills | Status: AC
Start: 2023-06-09 — End: ?

## 2023-06-09 NOTE — Progress Notes (Signed)
 [facility] Department of Endocrinology Diabetes and Metabolism   [address], [address], [place], [place], [address]  Phone: [phone]  Fax: [phone]    Date of Service: 06/09/2023    Primary Care Physician: Theressa Millard, Ve

## 2023-06-09 NOTE — Addendum Note (Signed)
 Addended by: Serafina Mitchell on: 06/09/2023 10:48 AM     Modules accepted: Orders

## 2023-06-17 ENCOUNTER — Encounter: Admit: 2023-06-17 | Admitting: Clinical Nurse Specialist

## 2023-06-17 DIAGNOSIS — E1065 Type 1 diabetes mellitus with hyperglycemia: Secondary | ICD-10-CM

## 2023-06-17 MED ORDER — INSULIN LISPRO 100 UNIT/ML IJ SOLN
100 | INTRAMUSCULAR | 5 refills | 62.00000 days | Status: DC
Start: 2023-06-17 — End: 2024-04-06

## 2023-06-17 NOTE — Telephone Encounter (Signed)
New pump start.

## 2023-07-07 ENCOUNTER — Encounter: Admit: 2023-07-07 | Admitting: Clinical Nurse Specialist

## 2023-07-07 DIAGNOSIS — E1065 Type 1 diabetes mellitus with hyperglycemia: Secondary | ICD-10-CM

## 2023-07-07 MED ORDER — EXACTECH TEST VI STRP
Freq: Every day | 3 refills | Status: DC
Start: 2023-07-07 — End: 2023-11-19

## 2023-07-11 LAB — HM DIABETES EYE EXAM: Diabetic Retinopathy: POSITIVE

## 2023-08-21 MED ORDER — GLUCOSE MONITORING KIT
PACK | Freq: Every day | 0 refills | 27.50 days | Status: DC
Start: 2023-08-21 — End: 2023-11-19

## 2023-08-21 MED ORDER — INSULIN LISPRO 100 UNIT/ML IJ SOLN
100 | INTRAMUSCULAR | 1 refills | Status: AC
Start: 2023-08-21 — End: ?

## 2023-08-21 MED ORDER — BAQSIMI ONE PACK 3 MG/DOSE NA POWD
3 | NASAL | 1 refills | Status: AC
Start: 2023-08-21 — End: ?

## 2023-08-21 MED ORDER — LANCETS MISC
Freq: Four times a day (QID) | 3 refills | Status: DC
Start: 2023-08-21 — End: 2023-11-19

## 2023-08-21 MED ORDER — BLOOD GLUCOSE TEST VI STRP
ORAL_STRIP | 3 refills | 27.50 days | Status: DC
Start: 2023-08-21 — End: 2023-11-19

## 2023-08-21 NOTE — Telephone Encounter (Signed)
Needs vials for insulin pump     Needs emergency glucagon baqsimi is covered by insurance     One touch verio meter

## 2023-09-10 LAB — T4, FREE: T4 Free: 1.3

## 2023-09-10 LAB — TSH: TSH: 2.57 u[IU]/mL

## 2023-09-16 ENCOUNTER — Ambulatory Visit: Admit: 2023-09-16 | Discharge: 2023-09-16 | Payer: PRIVATE HEALTH INSURANCE | Attending: "Endocrinology

## 2023-09-16 DIAGNOSIS — E1065 Type 1 diabetes mellitus with hyperglycemia: Secondary | ICD-10-CM

## 2023-09-16 MED ORDER — METHIMAZOLE 10 MG PO TABS
10 | ORAL_TABLET | ORAL | 3 refills | Status: DC
Start: 2023-09-16 — End: 2024-01-21

## 2023-09-16 NOTE — Progress Notes (Signed)
 [facility] Department of Endocrinology Diabetes and Metabolism   [address], [address], [place], [place], [address]  Phone: [phone]  Fax: [phone]    Date of Service: 09/16/2023  Primary Care Physician: Clovis Pu, DO   Provider: Shelly Flatten, MD     Reason for the visit:  Dm type 1, Graves disease     History of Present Illness:  The history is provided by the patient. No language interpreter was used. Accuracy of the patient data is excellent.  Maria Valdez is a very pleasant 58 y.o. female seen today for diabetes management     Maria Valdez is a very pleasant 58 y.o. old female with PMH of HTN, hyperthyroidism, type 2 diabetes mellitus, recurrent UTI, and other listed below seen today for follow up visit   DM type 1  Diagnosed in 2016, currently on Medtronic insulin pump . Basal rate 0.95, CR 13, ISF 57, Goal 100-150, AIT 2:45     A1c in October 2024 was 12.7 but we expect next A1c will be much better    Lab Results   Component Value Date/Time    LABA1C 12.7 05/08/2023 03:15 AM     Patient currently on: Medtronic insulin pump 780g with   Basal rate 0.95, CR 13, ISF 57, Goal 100-150, AIT 2:45     Ambulatory continuous glucose monitoring of interstitial tissue fluid via a subcutaneous     A1c 09/12/2023 - A1c 9.6    Lab Results   Component Value Date/Time    LABA1C 12.7 05/08/2023 03:15 AM       PAST MEDICAL HISTORY   Past Medical History:   Diagnosis Date    Diabetes mellitus (HCC)     HIV infection (HCC)     HTN (hypertension) 08/19/2011    Thyroid disease     Tobacco use disorder affecting pregnancy, antepartum     Tobacco use disorder affecting pregnancy, antepartum        PAST SURGICAL HISTORY   Past Surgical History:   Procedure Laterality Date    CHOLECYSTECTOMY      THYROID SURGERY         SOCIAL HISTORY   Tobacco:   reports that she has been smoking cigarettes. She has never used smokeless tobacco.  Alcohol:   reports no history of alcohol use.  Drugs:    reports no history of drug use.    FAMILY HISTORY   No family history on file.    ALLERGIES AND DRUG REACTIONS   Allergies   Allergen Reactions    Sulfa Antibiotics Hives and Swelling    Codeine Nausea Only       CURRENT MEDICATIONS   Current Outpatient Medications   Medication Sig Dispense Refill    insulin lispro (HUMALOG,ADMELOG) 100 UNIT/ML SOLN injection vial Inject as instructed via oump max daily dose 100u 90 mL 1    Glucagon (BAQSIMI ONE PACK) 3 MG/DOSE POWD For severe hypoglycemia 1 each 1    glucose monitoring kit 1 kit by Does not apply route daily 1 kit 0    blood glucose monitor strips Test 4 times a day & as needed for symptoms of irregular blood glucose. Dispense sufficient amount for indicated testing frequency plus additional to accommodate PRN testing needs. 400 strip 3    Lancets MISC 1 each by Does not apply route 4 times daily 400 each 3    blood glucose test strips (EXACTECH TEST) strip 1 each by  In Vitro route daily As needed. 100 each 3    insulin lispro (HUMALOG,ADMELOG) 100 UNIT/ML SOLN injection vial Via insulin pump max dose 40 units daily. 20 mL 5    atorvastatin (LIPITOR) 20 MG tablet Take 1 tablet by mouth daily      vitamin D 50 MCG (2000 UT) CAPS capsule Take 1 capsule by mouth daily      ferrous sulfate (IRON 325) 325 (65 Fe) MG tablet Take 1 tablet by mouth daily (with breakfast)      HUMALOG KWIKPEN 100 UNIT/ML SOPN Inject 6 units 3 times daily with meals plus low-dose insulin sliding scale 5 Adjustable Dose Pre-filled Pen Syringe 4    metFORMIN (GLUCOPHAGE) 1000 MG tablet Take 1 tablet by mouth      methimazole (TAPAZOLE) 10 MG tablet Take 1 tablet by mouth daily 1 tablets 5 days a week      propranolol (INDERAL LA) 120 MG extended release capsule Take 1 capsule by mouth daily      Elviteg-Cobic-Emtricit-TenofAF (GENVOYA) 150-150-200-10 MG TABS Take 1 tablet by mouth daily      Continuous Glucose Sensor (DEXCOM G7 SENSOR) MISC  (Patient not taking: Reported on 09/16/2023)       insulin glargine (LANTUS) 100 UNIT/ML injection vial Inject 18 Units into the skin daily (Patient not taking: Reported on 09/16/2023) 10 mL 4     No current facility-administered medications for this visit.       Review of Systems  Constitutional: No fever, no chills, no diaphoresis, no generalized weakness.  HEENT: No blurred vision, No sore throat, no ear pain, no hair loss  Neck: denied any neck swelling, difficulty swallowing,   Cardio-pulmonary: No CP, SOB or palpitation, No orthopnea or PND. No cough or wheezing.  GI: No N/V/D, no constipation, No abdominal pain, no melena or hematochezia   GU: Denied any dysuria, hematuria, flank pain, discharge, or incontinence.   Skin: denied any rash, ulcer, Hirsute, or hyperpigmentation.   MSK: denied any joint deformity, joint pain/swelling, muscle pain, or back pain.  Neuro: no numbness, no tingling, no weakness, _    OBJECTIVE    BP (!) 148/88   Pulse 73   Temp 98.4 F (36.9 C)   Resp 18   Ht 1.575 m (5\' 2" )   Wt 59.4 kg (131 lb)   SpO2 99%   BMI 23.96 kg/m   BP Readings from Last 4 Encounters:   09/16/23 (!) 148/88   06/09/23 (!) 167/93   05/10/23 138/81   09/09/18 (!) 142/91     Wt Readings from Last 6 Encounters:   09/16/23 59.4 kg (131 lb)   06/09/23 46.3 kg (102 lb)   05/09/23 45.6 kg (100 lb 8 oz)   09/09/18 61.2 kg (135 lb)   07/03/16 59 kg (130 lb)   05/26/16 60.8 kg (134 lb)       Physical examination:  General: awake alert, oriented x3, no abnormal position or movements.  HEENT: normocephalic non-traumatic, no exophthalmos   Neck: supple, no LN enlargement, no thyromegaly, no thyroid tenderness, no JVD.  Pulm: Clear equal air entry no added sounds, no wheezing or rhonchi    CVS: S1 + S2, no murmur, no heave. Dorsalis pedis pulse palpable   Abd: soft lax, no tenderness, no organomegaly, audible bowel sounds.   Skin: warm, no lesions, no rash. No callus, no Ulcers, No acanthosis nigricans  Musculoskeletal: No back tenderness, no kyphosis/scoliosis     Neuro: CN intact, Monofilament sensation  decreased bilateral , muscle power normal  Psych: normal mood, and affect      Review of Laboratory Data:  I personally reviewed the following lab:  Lab Results   Component Value Date/Time    WBC 9.6 05/10/2023 05:30 AM    RBC 3.73 05/10/2023 05:30 AM    HGB 10.9 (L) 05/10/2023 05:30 AM    HCT 33.4 (L) 05/10/2023 05:30 AM    MCV 89.5 05/10/2023 05:30 AM    MCH 29.2 05/10/2023 05:30 AM    MCHC 32.6 05/10/2023 05:30 AM    RDW 13.0 05/10/2023 05:30 AM    PLT 591 (H) 05/10/2023 05:30 AM    MPV 9.0 05/10/2023 05:30 AM      Lab Results   Component Value Date/Time    NA 136 05/10/2023 05:30 AM    K 4.5 05/10/2023 05:30 AM    CO2 26 05/10/2023 05:30 AM    BUN 5 (L) 05/10/2023 05:30 AM    CREATININE 0.4 (L) 05/10/2023 05:30 AM    CALCIUM 8.9 05/10/2023 05:30 AM    LABGLOM >90 05/10/2023 05:30 AM    GFRAA >60 09/09/2018 08:42 PM      Lab Results   Component Value Date/Time    TSH 0.74 05/08/2023 03:15 AM    T4FREE 1.3 05/08/2023 03:15 AM    T3TOTAL 88 05/08/2023 03:15 AM     Lab Results   Component Value Date/Time    LABA1C 12.7 05/08/2023 03:15 AM    GLUCOSE 287 05/10/2023 05:30 AM    GLUCOSE 90 09/24/2010 11:30 AM     Lab Results   Component Value Date/Time    LABA1C 12.7 05/08/2023 03:15 AM     Lab Results   Component Value Date/Time    TRIG 108 05/21/2012 03:15 PM    HDL 67.0 05/21/2012 03:15 PM    CHOL 169 05/21/2012 03:15 PM     Lab Results   Component Value Date/Time    VITD25 11 09/24/2010 11:30 AM       ASSESSMENT & RECOMMENDATIONS   BECKA LAGASSE, a 58 y.o.-old female seen in for the following issues       Assessment:      Diagnosis Orders   1. Type 1 diabetes mellitus with hyperglycemia (HCC)  GLUCOSE MONITOR, 72 HOUR, PHYS INTERP      2. Hyperthyroidism  TSH    T4, Free    methIMAzole (TAPAZOLE) 10 MG tablet      3. Graves disease        4. Insulin pump in place  GLUCOSE MONITOR, 72 HOUR, PHYS INTERP      5. 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 Graves disease   On  methimazole  10 mg 1 tablet 5 days a week and then on weekends  Pt s/o partial thyroidectomy long time ago (~ 30 years ago)   She still have hyperthyroidism      3. Chronic fatigue   Cortisol level WNL   Last TFTs within normal limits  4. Vitamin D deficiency   On vitamin d supplementation     I personally reviewed external notes from PCP and other patient's care team providers, and personally interpreted labs associated with the above diagnosis. I also ordered labs to further assess and manage the above addressed medical conditions    Return in about 3 months (around 12/17/2023) for DM type 1, Hyperthyroidism, VitD deficiency.    The above issues were reviewed with the patient who understood and agreed with the plan.    Thank you for allowing [place] to participate in the care of this patient. Please do not hesitate to contact us with any additional questions.     Devesh Monforte De Blanch, MD     [facility]   [address], [address], [place], [place], [address]  Phone: [phone]  Fax: [phone]  --------------------------------------------  An electronic signature was used to authenticate this note. Shelly Flatten, MD on 09/16/2023 at 10:01 AM

## 2023-09-19 ENCOUNTER — Encounter

## 2023-09-21 ENCOUNTER — Inpatient Hospital Stay
Admit: 2023-09-21 | Discharge: 2023-09-22 | Disposition: A | Discharge status: Home / Self Care | Payer: PRIVATE HEALTH INSURANCE

## 2023-09-21 ENCOUNTER — Emergency Department: Admit: 2023-09-21 | Payer: PRIVATE HEALTH INSURANCE

## 2023-09-21 DIAGNOSIS — N3001 Acute cystitis with hematuria: Secondary | ICD-10-CM

## 2023-09-21 LAB — CBC WITH AUTO DIFFERENTIAL
Basophils %: 1 % (ref 0.0–2.0)
Basophils Absolute: 0.06 10*3/uL (ref 0.00–0.20)
Eosinophils %: 3 % (ref 0–6)
Eosinophils Absolute: 0.23 10*3/uL (ref 0.05–0.50)
Hematocrit: 40.1 % (ref 34.0–48.0)
Hemoglobin: 13.2 g/dL (ref 11.5–15.5)
Immature Granulocytes %: 0 % (ref 0.0–5.0)
Immature Granulocytes Absolute: 0.03 10*3/uL (ref 0.00–0.58)
Lymphocytes %: 32 % (ref 20.0–42.0)
Lymphocytes Absolute: 2.81 10*3/uL (ref 1.50–4.00)
MCH: 29.8 pg (ref 26.0–35.0)
MCHC: 32.9 g/dL (ref 32.0–34.5)
MCV: 90.5 fL (ref 80.0–99.9)
MPV: 10.7 fL (ref 7.0–12.0)
Monocytes %: 6 % (ref 2.0–12.0)
Monocytes Absolute: 0.57 10*3/uL (ref 0.10–0.95)
Neutrophils %: 59 % (ref 43.0–80.0)
Neutrophils Absolute: 5.23 10*3/uL (ref 1.80–7.30)
Platelets: 294 10*3/uL (ref 130–450)
RBC: 4.43 m/uL (ref 3.50–5.50)
RDW: 13.4 % (ref 11.5–15.0)
WBC: 8.9 10*3/uL (ref 4.5–11.5)

## 2023-09-21 LAB — COMPREHENSIVE METABOLIC PANEL
ALT: 70 U/L — ABNORMAL HIGH (ref 0–32)
AST: 59 U/L — ABNORMAL HIGH (ref 0–31)
Albumin: 3.9 g/dL (ref 3.5–5.2)
Alkaline Phosphatase: 168 U/L — ABNORMAL HIGH (ref 35–104)
Anion Gap: 10 mmol/L (ref 7–16)
BUN: 14 mg/dL (ref 6–20)
CO2: 25 mmol/L (ref 22–29)
Calcium: 9.2 mg/dL (ref 8.6–10.2)
Chloride: 99 mmol/L (ref 98–107)
Creatinine: 0.7 mg/dL (ref 0.50–1.00)
Est, Glom Filt Rate: >90 mL/min/{1.73_m2} (ref >60)
Glucose: 255 mg/dL — ABNORMAL HIGH (ref 74–99)
Potassium: 3.7 mmol/L (ref 3.5–5.0)
Sodium: 134 mmol/L (ref 132–146)
Total Bilirubin: 0.5 mg/dL (ref 0.0–1.2)
Total Protein: 7.1 g/dL (ref 6.4–8.3)

## 2023-09-21 LAB — URINALYSIS WITH MICROSCOPIC
Bilirubin, Urine: NEGATIVE
Epithelial Cells, UA: 3 /HPF
Glucose, Ur: 250 mg/dL — AB
Ketones, Urine: NEGATIVE mg/dL
Nitrite, Urine: POSITIVE — AB
Protein, UA: NEGATIVE mg/dL
RBC, UA: 0 /HPF
Specific Gravity, UA: 1.02 (ref 1.005–1.030)
Urobilinogen, Urine: 0.2 EU/dL (ref 0.0–1.0)
WBC, UA: 21 /HPF — AB
pH, Urine: 6.5 (ref 5.0–8.0)

## 2023-09-21 LAB — TROPONIN
Troponin, High Sensitivity: 6 ng/L (ref 0–9)
Troponin, High Sensitivity: <6 ng/L (ref 0–9)

## 2023-09-21 LAB — INFLUENZA A + B, PCR
Influenza A by PCR: NOT DETECTED
Influenza B by PCR: NOT DETECTED

## 2023-09-21 LAB — COVID-19, RAPID: SARS-CoV-2, Rapid: NOT DETECTED

## 2023-09-21 MED ORDER — OXYCODONE-ACETAMINOPHEN 5-325 MG PO TABS
5-325 | Freq: Once | ORAL | Status: AC
Start: 2023-09-21 — End: 2023-09-21
  Administered 2023-09-21: 21:00:00 1 via ORAL

## 2023-09-21 MED ORDER — LIDOCAINE 4 % EX PTCH
4 | MEDICATED_PATCH | Freq: Every day | CUTANEOUS | 0 refills | Status: AC
Start: 2023-09-21 — End: 2023-10-21

## 2023-09-21 MED ORDER — KETOROLAC TROMETHAMINE 30 MG/ML IJ SOLN
30 | Freq: Once | INTRAMUSCULAR | Status: AC
Start: 2023-09-21 — End: 2023-09-21
  Administered 2023-09-21: 21:00:00 30 mg via INTRAVENOUS

## 2023-09-21 MED ORDER — IBUPROFEN 800 MG PO TABS
800 | ORAL_TABLET | Freq: Two times a day (BID) | ORAL | 0 refills | 14.00 days | Status: DC | PRN
Start: 2023-09-21 — End: 2023-11-19

## 2023-09-21 MED ORDER — CEFTRIAXONE SODIUM 1 G IJ SOLR
1 | Freq: Once | INTRAMUSCULAR | Status: AC
Start: 2023-09-21 — End: 2023-09-21
  Administered 2023-09-21: 22:00:00 1000 mg via INTRAVENOUS

## 2023-09-21 MED ORDER — CEFDINIR 300 MG PO CAPS
300 | ORAL_CAPSULE | Freq: Two times a day (BID) | ORAL | 0 refills | Status: DC
Start: 2023-09-21 — End: 2023-09-21

## 2023-09-21 MED ORDER — DEXAMETHASONE SODIUM PHOSPHATE 10 MG/ML IJ SOLN
10 | Freq: Once | INTRAMUSCULAR | Status: AC
Start: 2023-09-21 — End: 2023-09-21
  Administered 2023-09-21: 22:00:00 10 mg via ORAL

## 2023-09-21 MED ORDER — HYDROXYZINE PAMOATE 25 MG PO CAPS
25 | Freq: Once | ORAL | Status: AC
Start: 2023-09-21 — End: 2023-09-21
  Administered 2023-09-21: 23:00:00 25 mg via ORAL

## 2023-09-21 MED ORDER — TIZANIDINE HCL 2 MG PO CAPS
2 | ORAL_CAPSULE | Freq: Every evening | ORAL | 0 refills | Status: AC
Start: 2023-09-21 — End: 2023-10-01

## 2023-09-21 MED ORDER — CEFDINIR 300 MG PO CAPS
300 | ORAL_CAPSULE | Freq: Two times a day (BID) | ORAL | 0 refills | Status: AC
Start: 2023-09-21 — End: 2023-10-01

## 2023-09-21 MED ORDER — HYDROXYZINE HCL 25 MG PO TABS
25 | ORAL_TABLET | Freq: Three times a day (TID) | ORAL | 0 refills | Status: AC | PRN
Start: 2023-09-21 — End: 2023-10-01

## 2023-09-21 MED FILL — OXYCODONE-ACETAMINOPHEN 5-325 MG PO TABS: 5-325 MG | ORAL | Qty: 1

## 2023-09-21 MED FILL — CEFTRIAXONE SODIUM 1 G IJ SOLR: 1 g | INTRAMUSCULAR | Qty: 1000

## 2023-09-21 MED FILL — HYDROXYZINE PAMOATE 25 MG PO CAPS: 25 MG | ORAL | Qty: 1

## 2023-09-21 MED FILL — DEXAMETHASONE SODIUM PHOSPHATE 10 MG/ML IJ SOLN: 10 MG/ML | INTRAMUSCULAR | Qty: 1

## 2023-09-21 MED FILL — KETOROLAC TROMETHAMINE 30 MG/ML IJ SOLN: 30 MG/ML | INTRAMUSCULAR | Qty: 1

## 2023-09-21 NOTE — Discharge Instructions (Addendum)
 Your CT scan shows that you have a stone in your left kidney which does not cause any pain.  It also shows that you have some degenerative changes concerning for arthritis.  You also have diverticulosis which are small pockets in your colon that could become infected but it is not currently infected.  At some point you must get an endoscopy and an colonoscopy.  You do have a bladder infection.  You will be treated with antibiotics 1 pill in the morning 1 pill at night.  I did check your previous cultures and the antibiotic you were started on is sensitive to this medication.  Please take 1 pill in the morning 1 pill at night for the next 10 days.  Please drink plenty of fluids.  You were sent for your hip pain and arthritis lidocaine patches to place 12 hours on 12 hours off, you are also sent ibuprofen 800 mg alternate with Tylenol every 6-8 hours with food.  Please also use the muscle relaxers at night.  If you develop worsening symptoms always return back to the emergency department.

## 2023-09-21 NOTE — ED Provider Notes (Signed)
 Independent APP Visit.         [facility] EMERGENCY DEPARTMENT  ED  Encounter Note  Admit Date/RoomTime: 09/21/2023  3:58 PM  ED Room: 33/33  NAME: Maria Valdez  DOB: 26-Nov-1965  MRN: [id number]  PCP: Theressa Millard, Venus, DO    CHIEF COMPLAINT     Back Pain (Lower back pain started a few days ago. Same feeling when diagnosed with a UTI. ), Hip Pain (R side that radiates down leg), and Chest Pain (achy)    HISTORY OF PRESENT ILLNESS        Maria Valdez is a 58 y.o. female who presents to the ED by private vehicle for hip pain that started 3 weeks ago and back pain that started two days ago.  Patient states that hip pain has been going on for the last 3 weeks and is made better by movement.  Patient states that she has generalized body aching has been occurring for the last few weeks as well.  Patient reports that the pain is worsened over the last 3 weeks in her hip that when she tries to sleep on her side it wakes her up.  Patient states she has not tried any medication or over-the-counter remedies to help with the pain.  Patient reports that 2 days ago the back pain started that reminded her of when she was hospitalized for a UTI in October 2024.  Patient reported that her UTI began with low back pain and she was worried that this may be the start of another one.  Patient also reports that she is having substernal chest pain.  When asked for clarification, patient says that she is at not having pain like a heart attack but that her entire chest wall is achy.  Patient denies palpitations, shortness of breath, cough, saddle anesthesia, extremity numbness or weakness, loss of bowel or bladder function, fever, chills, abdominal pain. The complaint has been persistent and moderate in severity.  The patient states that she is a type I diabetic and was previously uncontrolled, having A1c's up to 14% and now A1c's are now better at 9.2%.      HEART Score For Major Cardiac Events  (Max Score  10 Points)  HISTORY       [x]    Slightly Suspicious  0       []    Moderately Suspicious  +1       []    Highly Suspicious  +2    EKG:   1 point: No ST deviation but LBBB, LVH repolarization changes (UX:NATFTDD);  2 points: ST deviation not due to LBBB, LVH or digoxin         [x]    Normal  0       []    Nonspecific Repolarization Disturbance  +1       []    Significant ST Depression  +2    AGE       []    <45  0       [x]    45-64  +1       []     >65  +2    RISK FACTORS:  1. HTN    2. Hypercholesterolemia    3. DM     4. Cigarette smoking (current or cessation < 3 mos)    5.  Positive family history  (parent or sibling with CVD before age 54).   6. Obesity (BMI >30kg/m2)         []   No Risk factors Known  0       []    1-2 Risk Factors  +1       [x]    >3 Risk Factors or History of Atherosclerotic Disease  +2    INITIAL TROPONIN       [x]    < Normal Limit   0       []    1-3 x Normal Limit   +1       []    >3 x Normal Limit   +2     -----------------------------------------------------------------------------------------------------------------  SCORE TOTAL:  3 POINTS     Low Score:         (0-3 Points), risk of MACE of 0.9-1.7% (discuss d/c home with f/u)  Mod Score:         (4-6 Points), risk of MACE of 12-16.6% (discuss admission for further testing)  High Score:        (7-10 Points), risk of MACE of 50-65% (Admit ALL as they are candidates for early invasive measures)     REVIEW OF SYSTEMS     Pertinent positives and negatives are stated within HPI, all other systems reviewed and are negative.    Past Medical History:  has a past medical history of Diabetes mellitus (HCC), HIV infection (HCC), HTN (hypertension), Thyroid disease, Tobacco use disorder affecting pregnancy, antepartum, and Tobacco use disorder affecting pregnancy, antepartum.  Surgical History:  has a past surgical history that includes Thyroid surgery and Cholecystectomy.  Social History:  reports that she has been smoking cigarettes. She has never used  smokeless tobacco. She reports that she does not drink alcohol and does not use drugs.  Family History: family history is not on file.   Allergies: Sulfa antibiotics and Codeine  CURRENT MEDICATIONS       Previous Medications    ATORVASTATIN (LIPITOR) 20 MG TABLET    Take 1 tablet by mouth daily    BLOOD GLUCOSE MONITOR STRIPS    Test 4 times a day & as needed for symptoms of irregular blood glucose. Dispense sufficient amount for indicated testing frequency plus additional to accommodate PRN testing needs.    BLOOD GLUCOSE TEST STRIPS (EXACTECH TEST) STRIP    1 each by In Vitro route daily As needed.    ELVITEG-COBIC-EMTRICIT-TENOFAF (GENVOYA) 150-150-200-10 MG TABS    Take 1 tablet by mouth daily    FERROUS SULFATE (IRON 325) 325 (65 FE) MG TABLET    Take 1 tablet by mouth daily (with breakfast)    GLUCAGON (BAQSIMI ONE PACK) 3 MG/DOSE POWD    For severe hypoglycemia    GLUCOSE MONITORING KIT    1 kit by Does not apply route daily    HUMALOG KWIKPEN 100 UNIT/ML SOPN    Inject 6 units 3 times daily with meals plus low-dose insulin sliding scale    INSULIN LISPRO (HUMALOG,ADMELOG) 100 UNIT/ML SOLN INJECTION VIAL    Via insulin pump max dose 40 units daily.    LANCETS MISC    1 each by Does not apply route 4 times daily    METFORMIN (GLUCOPHAGE) 1000 MG TABLET    Take 1 tablet by mouth    METHIMAZOLE (TAPAZOLE) 10 MG TABLET    1 tablets 5 days a week, none on Saturday and Sunday    PROPRANOLOL (INDERAL LA) 120 MG EXTENDED RELEASE CAPSULE    Take 1 capsule by mouth daily    VITAMIN D 50 MCG (2000 UT) CAPS CAPSULE    Take  1 capsule by mouth daily       SCREENINGS     Glasgow Coma Scale  Eye Opening: Spontaneous  Best Verbal Response: Oriented  Best Motor Response: Obeys commands  Glasgow Coma Scale Score: 15         CIWA Assessment  BP: (!) 124/98  Pulse: 80       PHYSICAL EXAM   Oxygen Saturation Interpretation: Normal on room air analysis.        ED Triage Vitals   BP Systolic BP Percentile Diastolic BP Percentile  Temp Temp Source Pulse Respirations SpO2   09/21/23 1557 -- -- 09/21/23 1557 09/21/23 1557 09/21/23 1557 09/21/23 1557 09/21/23 1557   (!) 122/109   97.7 F (36.5 C) Oral 77 18 99 %      Height Weight - Scale         09/21/23 1557 09/21/23 1552         1.575 m (5\' 2" ) 59.4 kg (131 lb)             Physical Exam  Constitutional/General: Alert and oriented x3, well appearing, non toxic  HEENT:  NC/NT. PERRLA,  Airway patent.  Neck: Supple, full ROM, non tender to palpation in the midline, no stridor, no crepitus, no meningeal signs  Respiratory: Lungs clear to auscultation bilaterally, no wheezes, rales, or rhonchi. Not in respiratory distress  CV:  Regular rate. Regular rhythm. No murmurs, gallops, or rubs. 2+ distal pulses  Chest: No chest wall tenderness  GI:  Abdomen Soft, Non tender, Non distended.  +BS.   No rebound, guarding, or rigidity. No pulsatile masses.  Musculoskeletal: Moves all extremities x 4. Warm and well perfused, no clubbing, cyanosis, or edema. Capillary refill <3 seconds.  Tender to palpation of the lateral right thigh and lateral right buttock.  Full flexion and extension of lower extremities.  No erythema.  No edema.  No vesicular rash.   Integument: skin warm and dry. No rashes.   Lymphatic: no lymphadenopathy noted  Neurologic: GCS 15, no focal deficits, symmetric strength 5/5 in the upper and lower extremities bilaterally  Psychiatric: Normal Affect    DIAGNOSTIC RESULTS   (All laboratory and radiology results have been personally reviewed by myself)  Labs:  Results for orders placed or performed during the hospital encounter of 09/21/23   COVID-19, Rapid    Specimen: Nasopharyngeal Swab   Result Value Ref Range    Specimen Description .NASOPHARYNGEAL SWAB     SARS-CoV-2, Rapid Not Detected Not Detected   Influenza A+B, PCR    Specimen: Nasopharyngeal   Result Value Ref Range    Influenza A by PCR Not Detected Not Detected    Influenza B by PCR Not Detected Not Detected   Urinalysis with  Microscopic   Result Value Ref Range    Color, UA Yellow Yellow    Turbidity UA Clear Clear    Glucose, Ur 250 (A) NEGATIVE mg/dL    Bilirubin, Urine NEGATIVE NEGATIVE    Ketones, Urine NEGATIVE NEGATIVE mg/dL    Specific Gravity, UA 1.020 1.005 - 1.030    Urine Hgb TRACE (A) NEGATIVE    pH, Urine 6.5 5.0 - 8.0    Protein, UA NEGATIVE NEGATIVE mg/dL    Urobilinogen, Urine 0.2 0.0 - 1.0 EU/dL    Nitrite, Urine POSITIVE (A) NEGATIVE    Leukocyte Esterase, Urine SMALL (A) NEGATIVE    WBC, UA 21 TO 50 (A) 0 TO 5 /HPF    RBC, UA 0  TO 2 0 TO 2 /HPF    Epithelial Cells, UA 3 to 5 /HPF    Bacteria, UA 3+ (A) None   CBC with Auto Differential   Result Value Ref Range    WBC 8.9 4.5 - 11.5 k/uL    RBC 4.43 3.50 - 5.50 m/uL    Hemoglobin 13.2 11.5 - 15.5 g/dL    Hematocrit 44.0 34.7 - 48.0 %    MCV 90.5 80.0 - 99.9 fL    MCH 29.8 26.0 - 35.0 pg    MCHC 32.9 32.0 - 34.5 g/dL    RDW 42.5 95.6 - 38.7 %    Platelets 294 130 - 450 k/uL    MPV 10.7 7.0 - 12.0 fL    Neutrophils % 59 43.0 - 80.0 %    Lymphocytes % 32 20.0 - 42.0 %    Monocytes % 6 2.0 - 12.0 %    Eosinophils % 3 0 - 6 %    Basophils % 1 0.0 - 2.0 %    Immature Granulocytes % 0 0.0 - 5.0 %    Neutrophils Absolute 5.23 1.80 - 7.30 k/uL    Lymphocytes Absolute 2.81 1.50 - 4.00 k/uL    Monocytes Absolute 0.57 0.10 - 0.95 k/uL    Eosinophils Absolute 0.23 0.05 - 0.50 k/uL    Basophils Absolute 0.06 0.00 - 0.20 k/uL    Immature Granulocytes Absolute 0.03 0.00 - 0.58 k/uL   Comprehensive Metabolic Panel   Result Value Ref Range    Sodium 134 132 - 146 mmol/L    Potassium 3.7 3.5 - 5.0 mmol/L    Chloride 99 98 - 107 mmol/L    CO2 25 22 - 29 mmol/L    Anion Gap 10 7 - 16 mmol/L    Glucose 255 (H) 74 - 99 mg/dL    BUN 14 6 - 20 mg/dL    Creatinine 0.7 5.64 - 1.00 mg/dL    Est, Glom Filt Rate >90 >60 mL/min/1.31m2    Calcium 9.2 8.6 - 10.2 mg/dL    Total Protein 7.1 6.4 - 8.3 g/dL    Albumin 3.9 3.5 - 5.2 g/dL    Total Bilirubin 0.5 0.0 - 1.2 mg/dL    Alkaline Phosphatase 168  (H) 35 - 104 U/L    ALT 70 (H) 0 - 32 U/L    AST 59 (H) 0 - 31 U/L   Troponin   Result Value Ref Range    Troponin, High Sensitivity <6 0 - 9 ng/L   EKG 12 Lead   Result Value Ref Range    Ventricular Rate 70 BPM    Atrial Rate 70 BPM    P-R Interval 150 ms    QRS Duration 72 ms    Q-T Interval 420 ms    QTc Calculation (Bazett) 453 ms    P Axis 67 degrees    R Axis 46 degrees    T Axis 58 degrees     Imaging:  All Radiology results interpreted by Radiologist unless otherwise noted.  XR HIP 2-3 VW W PELVIS RIGHT   Final Result   Mild osteoarthritic changes of the hips.         XR CHEST (2 VW)   Final Result   No acute cardiopulmonary process.         CT ABDOMEN PELVIS WO CONTRAST Additional Contrast? None   Final Result   1. No evidence of obstructive uropathy.   2. Small 1 mm nonobstructing  upper pole left renal calculus.   3. Diverticulosis without evidence of diverticulitis.   4. Status post cholecystectomy.         CT LUMBAR SPINE WO CONTRAST   Final Result   1. No acute osseous abnormality involving the lumbar spine.   2. Moderate right neural foraminal narrowing at L5/S1.   3. No significant central canal stenosis.  MRI may be warranted for further   evaluation.   4. 2 mm nonobstructing left renal calculus.             ED COURSE   Vitals:    Vitals:    09/21/23 1552 09/21/23 1557 09/21/23 1827   BP:  (!) 122/109 (!) 124/98   Pulse:  77 80   Resp:  18    Temp:  97.7 F (36.5 C)    TempSrc:  Oral    SpO2:  99%    Weight: 59.4 kg (131 lb) 57.6 kg (127 lb)    Height:  1.575 m (5\' 2" )        Patient was given the following medications:  Medications   ketorolac (TORADOL) injection 30 mg (30 mg IntraVENous Given 09/21/23 1726)   dexAMETHasone (DECADRON) injection 10 mg (10 mg Oral Given 09/21/23 1731)   oxyCODONE-acetaminophen (PERCOCET) 5-325 MG per tablet 1 tablet (1 tablet Oral Given 09/21/23 1724)   cefTRIAXone (ROCEPHIN) 1,000 mg in sterile water 10 mL IV syringe (1,000 mg IntraVENous Given 09/21/23 1813)       ED  Course as of 09/21/23 1841   Sun Sep 21, 2023   1747 Patient's previous acute cystitis was positive for E. coli.  Patient is sensitive to cephalosporin.  1 g of Rocephin ordered [AM]      ED Course User Index  [AM] Haevyn Ury, Gordy Councilman, PA-C     EKG #1:  Interpreted by emergency department attending physician unless otherwise noted.    09/21/23  Time: 1735    Rhythm: normal sinus   Rate: 70-80  Axis: normal  Conduction: normal  ST Segments: normal  T Waves: no acute change    Clinical Impression: NSR, Normal ecg  Comparison to Prior tracings:  Today's ECG is unchanged from previous tracings.      PROCEDURES   none    REASSESSMENT   09/21/23       Time: 1833 patient notified of results of imaging and labs.  Patient states that she feels a little anxious but the pain has decreased a little since treatment    CONSULTS:  None    MEDICAL DECISION MAKING:   I considered, but did not perform, additional testing such CT Angiogram, as well as admission or transfer to a higher level of care.     I utilized an evidence-based risk rating tool (CMT) along with my training and experience to weigh the risk of discharge against the risks of further testing, imaging, or hospitalization. At this time, I estimate the risks of additional testing, imaging, or hospitalization to be equal to or greater than the risk of discharge(in the case of discharge home).      The patient's HEART Score is 3. In rare cases, I give patients with HEART Score of 4 the option of discharge, but only when they meet criteria for "Low 4," meaning that HST was used, and the 4 is not from a highly suspicious story, highly suspicious EKG, or positive cardiac enzymes.  In these selected cases, the risk of a "Low 4" is still most likely  lower than the risk of admission and further testing/imaging. [id number]    SHARED DECISION MAKING:   I discussed my risk assessment with the patient. The patient understands and consents to the risk of disposition/plan, as well  as the risk of uncertainty in estimating outcomes. [id number]           DIFFERENTIAL DX_MDM   MDM:   Social Determinants : None    Records Reviewed : Source  and Inpatient Notes   3//2025 patient seen at [facility] endocrinology for type 1 diabetes and Graves' disease  06/09/2023 patient seen at [facility] endocrinology for initial visit to discuss type 1 diabetes mellitus     CC/HPI Summary, DDx, ED Course, and Reassessment: Patient presents with Back Pain (Lower back pain started a few days ago. Same feeling when diagnosed with a UTI. ), Hip Pain (R side that radiates down leg), and Chest Pain (achy)     Maria Valdez is a 58 y.o. female who presents to the ED by private vehicle for hip pain that started 3 weeks ago and back pain that started two days ago.  Patient states that hip pain has been going on for the last 3 weeks and is made better by movement.  Patient states that she has generalized body aching has been occurring for the last few weeks as well.  Patient reports that the pain is worsened over the last 3 weeks in her hip that when she tries to sleep on her side it wakes her up.  Patient states she has not tried any medication or over-the-counter remedies to help with the pain.  Patient reports that 2 days ago the back pain started that reminded her of when she was hospitalized for a UTI in October 2024.  Patient reported that her UTI began with low back pain and she was worried that this may be the start of another one.  Patient also reports that she is having substernal chest pain.  When asked for clarification, patient says that she is at not having pain like a heart attack but that her entire chest wall is achy.  Patient denies palpitations, shortness of breath, cough, saddle anesthesia, extremity numbness or weakness, loss of bowel or bladder function, fever, chills, abdominal pain. The complaint has been persistent and moderate in severity.  The patient states that she is a type II  diabetic and was previously uncontrolled, having A1c's up to 14% and now A1c's are now better at 9.2%.    DIFFERENTIALS Include but not limited to: Diabetic ketoacidosis, pyelonephritis, acute cystitis, trochanteric bursitis, iliotibial band syndrome, lumbar back strain, myocardial infarction    RESULTS/INTERVENTIONS  Urinalysis, urine culture, CBC, CMP, COVID-19, influenza A, troponin, XR chest, CT abdomen pelvis without contrast, CT lumbar spine without contrast, x-ray hip with pelvis, ekg    Urinalysis shows evidence of a urinary tract infection.  Patient will be treated with cefdinir.  CBC shows no signs of anemia or leukocytosis.  CMP showed no no signs of AKI.  CMP showed ALT of 70, AST of 59 and alkaline phosphatase of 168.  First troponin was less than 6.  COVID and flu were both negative.  X-ray of hip shows mild arthritic changes of the hips.  X-ray of chest shows no acute cardiopulmonary changes.  CT abdomen pelvis without contrast show small 1 mm nonobstructing stone in the kidney and diverticulosis without evidence of diverticulitis.  CT of the lumbar spine shows moderate right foraminal narrowing at L5/S1.  Patient notified of all the lab and imaging results.  Patient states that she is feeling a bit better after treatment but she is very anxious.  Patient would like something for anxiety if possible.  Patient notified she will be sent cefdinir 300 mg, hydroxyzine 25 mg, ibuprofen 800 mg, lidocaine 4% patches and tizanidine 2 mg to the pharmacy.  Patient educated that she should only take hydroxyzine when she feels anxious.  Advised to only use lidocaine patches for a period of 12 hours on and 12 hours off.  Patient advised to use the tizanidine for muscle spasms when she feels the pain is increasing and should not drive or operate heavy machinery while using.  Patient shows understanding of plan and is agreeable.  Patient advised to follow-up with PCP and endocrinology to continue with treatment.   Patient states that if she does not feel like her symptoms are improving in 2 to 3 days she should return to the emergency department for admission.  Advised return to the emergency department if she notices loss of bowel or bladder function, fever, or chills.  Patient shows understanding and is stable.  Patient to be discharged to home.    Interpreted by me, Janeth Rase [facility] (unless otherwise specified)    I am primary clinician of record and case was not discussed with ED physician    Patient was explicitly instructed on specific signs and symptoms on which to return to the emergency room for. Patient was instructed to return to the ER for any new or worsening symptoms. Additional discharge instructions were given verbally. All questions were answered. Patient is comfortable and agreeable with discharge plan. Patient in no acute distress and non-toxic in appearance.       Plan of Care/Counseling:  Janeth Rase, PA-C reviewed today's visit with the patient in addition to providing specific details for the plan of care and counseling regarding the diagnosis and prognosis.  Questions are answered at this time and are agreeable with the plan.    ASSESSMENT     1. Acute cystitis with hematuria    2. Right hip pain    3. Kidney stone on left side    4. Diverticulosis    5. Strain of lumbar region, initial encounter    6. Hip arthritis    7. Anxiety state        DISPOSITION   Discharged home.  Patient condition is stable    Discharge Instructions:   Patient referred to  Quinton, Venus, DO  [address]  [address] [place] [address]    In 1 week  For Follow Up    NEW MEDICATIONS     New Prescriptions    CEFDINIR (OMNICEF) 300 MG CAPSULE    Take 1 capsule by mouth 2 times daily for 10 days    HYDROXYZINE HCL (ATARAX) 25 MG TABLET    Take 1 tablet by mouth every 8 hours as needed for Anxiety    IBUPROFEN (ADVIL;MOTRIN) 800 MG TABLET    Take 1 tablet by mouth 2 times daily as needed for Pain    LIDOCAINE 4 %  EXTERNAL PATCH    Place 1 patch onto the skin daily    TIZANIDINE (ZANAFLEX) 2 MG CAPSULE    Take 1 capsule by mouth nightly for 10 days For muscle spasm     Electronically signed by Janeth Rase, PA-C   DD: 09/21/23  **This report was transcribed using voice recognition software. Every effort was made  to ensure accuracy; however, inadvertent computerized transcription errors may be present.  END OF ED PROVIDER NOTE       Janeth Rase, PA-C  09/21/23 1841

## 2023-09-22 NOTE — Telephone Encounter (Signed)
-----   Message from Pasty Arch sent at 09/22/2023  8:06 AM EDT -----  Please call patient and inform her thyroid function is normal.  This is an excellent result.  Continue same

## 2023-09-22 NOTE — Telephone Encounter (Signed)
 Pt notified via mychart

## 2023-09-24 LAB — CULTURE, URINE

## 2023-09-25 LAB — EKG 12-LEAD
Atrial Rate: 70 {beats}/min
P Axis: 67 degrees
P-R Interval: 150 ms
Q-T Interval: 420 ms
QRS Duration: 72 ms
QTc Calculation (Bazett): 453 ms
R Axis: 46 degrees
T Axis: 58 degrees
Ventricular Rate: 70 {beats}/min

## 2023-09-28 ENCOUNTER — Emergency Department: Admit: 2023-09-28 | Payer: PRIVATE HEALTH INSURANCE

## 2023-09-28 ENCOUNTER — Inpatient Hospital Stay
Admit: 2023-09-28 | Discharge: 2023-09-29 | Disposition: A | Discharge status: Home / Self Care | Payer: PRIVATE HEALTH INSURANCE | Attending: Emergency Medicine

## 2023-09-28 DIAGNOSIS — M545 Low back pain, unspecified: Secondary | ICD-10-CM

## 2023-09-28 LAB — CBC WITH AUTO DIFFERENTIAL
Basophils %: 0 % (ref 0.0–2.0)
Basophils Absolute: 0.04 10*3/uL (ref 0.00–0.20)
Eosinophils %: 2 % (ref 0–6)
Eosinophils Absolute: 0.22 10*3/uL (ref 0.05–0.50)
Hematocrit: 41 % (ref 34.0–48.0)
Hemoglobin: 13.6 g/dL (ref 11.5–15.5)
Immature Granulocytes %: 1 % (ref 0.0–5.0)
Immature Granulocytes Absolute: 0.06 10*3/uL (ref 0.00–0.58)
Lymphocytes %: 28 % (ref 20.0–42.0)
Lymphocytes Absolute: 2.7 10*3/uL (ref 1.50–4.00)
MCH: 29.9 pg (ref 26.0–35.0)
MCHC: 33.2 g/dL (ref 32.0–34.5)
MCV: 90.1 fL (ref 80.0–99.9)
MPV: 9.4 fL (ref 7.0–12.0)
Monocytes %: 7 % (ref 2.0–12.0)
Monocytes Absolute: 0.65 10*3/uL (ref 0.10–0.95)
Neutrophils %: 62 % (ref 43.0–80.0)
Neutrophils Absolute: 6.03 10*3/uL (ref 1.80–7.30)
Platelets: 319 10*3/uL (ref 130–450)
RBC: 4.55 m/uL (ref 3.50–5.50)
RDW: 13.4 % (ref 11.5–15.0)
WBC: 9.7 10*3/uL (ref 4.5–11.5)

## 2023-09-28 LAB — URINALYSIS WITH MICROSCOPIC
Bilirubin, Urine: NEGATIVE
Epithelial Cells, UA: 10 /HPF
Glucose, Ur: >=1000 mg/dL — AB
Leukocyte Esterase, Urine: NEGATIVE
Nitrite, Urine: NEGATIVE
Protein, UA: NEGATIVE mg/dL
RBC, UA: 0 /HPF
Specific Gravity, UA: 1.025 (ref 1.005–1.030)
Urine Hgb: NEGATIVE
Urobilinogen, Urine: 0.2 EU/dL (ref 0.0–1.0)
WBC, UA: 0 /HPF
pH, Urine: 6 (ref 5.0–8.0)

## 2023-09-28 LAB — COMPREHENSIVE METABOLIC PANEL
ALT: 65 U/L — ABNORMAL HIGH (ref 0–32)
AST: 41 U/L — ABNORMAL HIGH (ref 0–31)
Albumin: 3.9 g/dL (ref 3.5–5.2)
Alkaline Phosphatase: 153 U/L — ABNORMAL HIGH (ref 35–104)
Anion Gap: 12 mmol/L (ref 7–16)
BUN: 17 mg/dL (ref 6–20)
CO2: 22 mmol/L (ref 22–29)
Calcium: 9.4 mg/dL (ref 8.6–10.2)
Chloride: 105 mmol/L (ref 98–107)
Creatinine: 0.8 mg/dL (ref 0.50–1.00)
Est, Glom Filt Rate: 84 mL/min/{1.73_m2} (ref >60)
Glucose: 232 mg/dL — ABNORMAL HIGH (ref 74–99)
Potassium: 3.8 mmol/L (ref 3.5–5.0)
Sodium: 139 mmol/L (ref 132–146)
Total Bilirubin: 0.3 mg/dL (ref 0.0–1.2)
Total Protein: 6.6 g/dL (ref 6.4–8.3)

## 2023-09-28 LAB — MAGNESIUM: Magnesium: 1.7 mg/dL (ref 1.6–2.6)

## 2023-09-28 LAB — TROPONIN: Troponin, High Sensitivity: 7 ng/L (ref 0–9)

## 2023-09-28 MED ORDER — KETOROLAC TROMETHAMINE 15 MG/ML IJ SOLN
15 | Freq: Once | INTRAMUSCULAR | Status: AC
Start: 2023-09-28 — End: 2023-09-28
  Administered 2023-09-28: 23:00:00 15 mg via INTRAVENOUS

## 2023-09-28 MED FILL — KETOROLAC TROMETHAMINE 15 MG/ML IJ SOLN: 15 MG/ML | INTRAMUSCULAR | Qty: 1 | Fill #0

## 2023-09-28 NOTE — ED Provider Notes (Signed)
 HPI:  09/28/23, Time: 7:06 PM EDT         Maria Valdez is a 58 y.o. female presenting to the ED for atraumatic bilateral flank pain and generalized fatigue beginning last week.  Patient was seen in the ED for the same symptoms last week.  She had extensive workup including blood work and urinalysis with urine culture which grew E. coli sensitive to [employer].  Patient was discharged on Omnicef.  She states she has been taking it without relief.  Patient also underwent CT abdomen and pelvis which was negative for acute findings as well as CT lumbar spine which was reassuring.  Patient presents today reporting persistent pain.  No bowel or bladder incontinence or retention, saddle anesthesia, lower extremity numbness or weakness.  She states that when the pain comes on she feels heaviness in her chest.  Denies fevers, nausea, vomiting, or diarrhea.    Review of Systems:  Pertinent positives and negatives as per HPI.    --------------------------------------------- PAST HISTORY ---------------------------------------------  Past Medical History:  has a past medical history of Diabetes mellitus (HCC), HIV infection (HCC), HTN (hypertension), Thyroid disease, Tobacco use disorder affecting pregnancy, antepartum, and Tobacco use disorder affecting pregnancy, antepartum.    Past Surgical History:  has a past surgical history that includes Thyroid surgery and Cholecystectomy.    Social History:  reports that she has been smoking cigarettes. She has never used smokeless tobacco. She reports that she does not drink alcohol and does not use drugs.    Family History: family history is not on file.     The patient's home medications have been reviewed.    Allergies: Sulfa antibiotics and Codeine    -------------------------------------------------- RESULTS -------------------------------------------------  All laboratory and radiology results have been personally reviewed by myself   LABS:  Results for orders placed or  performed during the hospital encounter of 09/28/23   Urinalysis with Microscopic   Result Value Ref Range    Color, UA Yellow Yellow    Turbidity UA Clear Clear    Glucose, Ur >=1000 (A) NEGATIVE mg/dL    Bilirubin, Urine NEGATIVE NEGATIVE    Ketones, Urine TRACE (A) NEGATIVE mg/dL    Specific Gravity, UA 1.025 1.005 - 1.030    Urine Hgb NEGATIVE NEGATIVE    pH, Urine 6.0 5.0 - 8.0    Protein, UA NEGATIVE NEGATIVE mg/dL    Urobilinogen, Urine 0.2 0.0 - 1.0 EU/dL    Nitrite, Urine NEGATIVE NEGATIVE    Leukocyte Esterase, Urine NEGATIVE NEGATIVE    WBC, UA 0 TO 5 0 TO 5 /HPF    RBC, UA 0 TO 2 0 TO 2 /HPF    Epithelial Cells, UA 10 TO 20 /HPF    Bacteria, UA 1+ (A) None   CBC with Auto Differential   Result Value Ref Range    WBC 9.7 4.5 - 11.5 k/uL    RBC 4.55 3.50 - 5.50 m/uL    Hemoglobin 13.6 11.5 - 15.5 g/dL    Hematocrit 16.1 09.6 - 48.0 %    MCV 90.1 80.0 - 99.9 fL    MCH 29.9 26.0 - 35.0 pg    MCHC 33.2 32.0 - 34.5 g/dL    RDW 04.5 40.9 - 81.1 %    Platelets 319 130 - 450 k/uL    MPV 9.4 7.0 - 12.0 fL    Neutrophils % 62 43.0 - 80.0 %    Lymphocytes % 28 20.0 - 42.0 %  Monocytes % 7 2.0 - 12.0 %    Eosinophils % 2 0 - 6 %    Basophils % 0 0.0 - 2.0 %    Immature Granulocytes % 1 0.0 - 5.0 %    Neutrophils Absolute 6.03 1.80 - 7.30 k/uL    Lymphocytes Absolute 2.70 1.50 - 4.00 k/uL    Monocytes Absolute 0.65 0.10 - 0.95 k/uL    Eosinophils Absolute 0.22 0.05 - 0.50 k/uL    Basophils Absolute 0.04 0.00 - 0.20 k/uL    Immature Granulocytes Absolute 0.06 0.00 - 0.58 k/uL   CMP   Result Value Ref Range    Sodium 139 132 - 146 mmol/L    Potassium 3.8 3.5 - 5.0 mmol/L    Chloride 105 98 - 107 mmol/L    CO2 22 22 - 29 mmol/L    Anion Gap 12 7 - 16 mmol/L    Glucose 232 (H) 74 - 99 mg/dL    BUN 17 6 - 20 mg/dL    Creatinine 0.8 8.41 - 1.00 mg/dL    Est, Glom Filt Rate 84 >60 mL/min/1.84m2    Calcium 9.4 8.6 - 10.2 mg/dL    Total Protein 6.6 6.4 - 8.3 g/dL    Albumin 3.9 3.5 - 5.2 g/dL    Total Bilirubin 0.3 0.0 - 1.2  mg/dL    Alkaline Phosphatase 153 (H) 35 - 104 U/L    ALT 65 (H) 0 - 32 U/L    AST 41 (H) 0 - 31 U/L   Troponin   Result Value Ref Range    Troponin, High Sensitivity 7 0 - 9 ng/L   Magnesium   Result Value Ref Range    Magnesium 1.7 1.6 - 2.6 mg/dL   EKG 12 Lead   Result Value Ref Range    Ventricular Rate 88 BPM    Atrial Rate 88 BPM    P-R Interval 130 ms    QRS Duration 84 ms    Q-T Interval 382 ms    QTc Calculation (Bazett) 462 ms    P Axis 77 degrees    R Axis 83 degrees    T Axis 50 degrees       RADIOLOGY:  Interpreted by Radiologist.  XR CHEST (2 VW)   Final Result   No acute cardiopulmonary pathology seen.             ------------------------- NURSING NOTES AND VITALS REVIEWED ---------------------------   The nursing notes within the ED encounter and vital signs as below have been reviewed.   BP (!) 151/82   Pulse 80   Temp 97.6 F (36.4 C) (Temporal)   Resp 18   Ht 1.575 m (5\' 2" )   Wt 57.6 kg (127 lb)   SpO2 100%   BMI 23.23 kg/m   Oxygen Saturation Interpretation: Normal      ---------------------------------------------------PHYSICAL EXAM--------------------------------------      Constitutional/General: Alert and oriented x3, appears uncomfortable, non toxic in NAD  Head: Normocephalic and atraumatic  Mouth: Oropharynx clear, handling secretions, no trismus  Neck: Supple, full ROM,   Pulmonary: Lungs clear to auscultation bilaterally, no wheezes, rales, or rhonchi. Not in respiratory distress  Cardiovascular:  Regular rate and rhythm. 2+ distal pulses  Abdomen: Soft, non tender, non distended  Back: no spinal ttp  Extremities: Moves all extremities x 4. Warm and well perfused  Skin: warm and dry without rash  Neurologic: GCS 15, no focal motor or sensory deficits   Psych: Normal  Affect. Behavior normal.      ------------------------------ ED COURSE/MEDICAL DECISION MAKING----------------------  Medications   ketorolac (TORADOL) injection 15 mg (15 mg IntraVENous Given 09/28/23 1914)        Medical Decision Making/ED COURSE:     Patient is a 58 y.o. female presenting to the ED for acute onset flank pain, moderate in severity. In the ED, patient was hemodynamically stable and afebrile. Labs and imaging obtained. Differential diagnosis includes electrolyte abnormality, dehydration, UTI, musculoskeletal pain. Patient administered IV Toradol.    I reviewed and interpreted labs. CBC and CMP was unremarkable with no acute findings. No leukocytosis or anemia. CMP showed normal electrolytes and baseline kidney function.  Mild elevation in LFTs does not appear to be significantly changed when compared to prior labs.  No reproducible abdominal tenderness.  Hyperglycemia of 232 but no evidence of DKA.  Urinalysis shows 1+ bacteria but no white cells.  Cardiac enzymes negative.  No acute ischemic changes on EKG.  ACS not suspected.    Chest xray interpreted by me. Interpretation-lungs clear, no infiltrate. Radiologist confirms read.    Please see remainder of MDM below in ED course.      CONSULTS: (Who and What was discussed)  None    Social Determinants of Health : None    Chronic Conditions affecting care:    has a past medical history of Diabetes mellitus (HCC), HIV infection (HCC), HTN (hypertension) (08/19/2011), Thyroid disease, Tobacco use disorder affecting pregnancy, antepartum, and Tobacco use disorder affecting pregnancy, antepartum.     Medical Decision Making  Problems Addressed:  Acute bilateral low back pain without sciatica: acute illness or injury    Amount and/or Complexity of Data Reviewed  External Data Reviewed: ECG and notes.  Labs: ordered. Decision-making details documented in ED Course.  Radiology: ordered and independent interpretation performed. Decision-making details documented in ED Course.  ECG/medicine tests: ordered and independent interpretation performed. Decision-making details documented in ED Course.    Risk  Prescription drug management.           ED Course as of 09/28/23  2129   Wynelle Link Sep 28, 2023   1831 I reviewed the patient's chart.    Patient admitted on 05/07/2023 for UTI. [JA]   2041 I reevaluated the patient and discussed reassuring workup.  Patient complains of pain in her lower back which is better with a hot bath or heating pad.  She has has had no recent falls or trauma to her back.  No bowel or bladder incontinence or retention, saddle anesthesia, or extremity numbness or weakness.  She has no signs or symptoms of spinal cord on exam.  She is ambulatory without difficulty.  She had a recent CT lumbar spine as well as CT abdomen pelvis for the same symptoms without acute findings.  I discussed with the patient that she may need outpatient MRI to further evaluate her symptoms but she does not need an emergent MRI at this time.  She is afebrile with no leukocytosis and stable vital signs.  Patient states she does not have a PCP.  She will be provided with a referral to a PCP.  She would like a Medrol Dosepak.  She is diabetic so I told her that she needs to monitor her blood sugars closely while being on the steroid.  Advised taking Motrin or Tylenol as needed for pain but advised caution when taking frequent NSAIDs especially with steroids.  Patient's urinalysis appears to be improving on Omnicef.  Recent urine  culture shows that she is on the appropriate antibiotic.  I advised that she continue taking her Omnicef.  Strict ED return precautions discussed. [JA]   2045 EKG:  This EKG is signed and interpreted by me, Dr. Nicoletta Ba, MD    Rate: 88  Rhythm: Sinus  Interpretation: Normal sinus rhythm, normal PR interval, normal QRS, normal axis, normal QT interval, no acute ST or T wave changes, nonspecific T wave abnormalities do not appear to be significantly changed when compared to prior EKG  Comparison: stable as compared to patient's most recent EKG [JA]      ED Course User Index  [JA] Awad-Spirtos, Yehuda Mao, MD       Patient remained hemodynamically stable throughout ED  course.     Discharge Medication List as of 09/28/2023  8:49 PM        START taking these medications    Details   methylPREDNISolone (MEDROL, PAK,) 4 MG tablet Take by mouth., Disp-1 kit, R-0Normal               Counseling:   The emergency provider has spoken with the patient and discussed today's results, in addition to providing specific details for the plan of care and counseling regarding the diagnosis and prognosis.  Questions are answered at this time and they are agreeable with the plan.      --------------------------------- IMPRESSION AND DISPOSITION ---------------------------------    IMPRESSION  1. Acute bilateral low back pain without sciatica        DISPOSITION  Disposition: Discharge to home  Patient condition is stable      NOTE: This report was transcribed using voice recognition software. Every effort was made to ensure accuracy; however, inadvertent computerized transcription errors may be present    I, Minus Breeding, MD, am the primary provider of this record        Minus Breeding, MD  09/28/23 2129

## 2023-09-29 LAB — EKG 12-LEAD
Atrial Rate: 88 {beats}/min
P Axis: 77 degrees
P-R Interval: 130 ms
Q-T Interval: 382 ms
QRS Duration: 84 ms
QTc Calculation (Bazett): 462 ms
R Axis: 83 degrees
T Axis: 50 degrees
Ventricular Rate: 88 {beats}/min

## 2023-09-29 MED ORDER — METHYLPREDNISOLONE 4 MG PO TBPK
4 | PACK | ORAL | 0 refills | Status: AC
Start: 2023-09-29 — End: 2023-10-04

## 2023-09-30 LAB — CULTURE, URINE: Culture: NO GROWTH

## 2023-10-08 NOTE — Telephone Encounter (Signed)
-----   Message from [place] sent at 10/07/2023 12:48 PM EDT -----  Regarding: [facility] Appointment Request  [facility] Appointment Request    Patient needs appointment for [facility] Appoint[facility] Type: New Patient.    Patient Requested Dates(s): as soon as possible not between April 9-14, [address]2025  Patient Requested Time:  Afternoon  Provider Name: Benard Halsted, MD      Reason for Appointment Request: New Patient - No appointments available during search  --------------------------------------------------------------------------------------------------------------------------    Relationship to Patient: Self     Call Back Information: OK to leave message on voicemail  Preferred Call Back Number: Phone [phone]

## 2023-10-08 NOTE — Telephone Encounter (Signed)
 Patient scheduled with Dr Lorie Apley - May 7th.

## 2023-10-08 NOTE — Telephone Encounter (Signed)
 Before I call [name], just confirming that you are not able to accept any New Patients at this time. Please advise.

## 2023-10-08 NOTE — Telephone Encounter (Signed)
 Correct.  I would recommend Dr. Lorie Apley.

## 2023-10-27 ENCOUNTER — Encounter

## 2023-10-27 ENCOUNTER — Ambulatory Visit: Admit: 2023-10-27 | Discharge: 2023-10-27 | Payer: PRIVATE HEALTH INSURANCE | Attending: Family Medicine

## 2023-10-27 VITALS — BP 142/98 | HR 79 | Temp 97.90000°F | Resp 16 | Ht 62.0 in | Wt 133.0 lb

## 2023-10-27 DIAGNOSIS — M4807 Spinal stenosis, lumbosacral region: Secondary | ICD-10-CM

## 2023-10-27 LAB — POCT URINALYSIS DIPSTICK
Bilirubin, UA: NEGATIVE
Glucose, UA POC: NEGATIVE mg/dL
Ketones, UA: NEGATIVE mg/dL
Spec Grav, UA: 1.02
Urobilinogen, UA: 0.2 mg/dL
pH, UA: 7.5

## 2023-10-27 MED ORDER — GABAPENTIN 100 MG PO CAPS
100 MG | ORAL_CAPSULE | Freq: Two times a day (BID) | ORAL | 0 refills | Status: DC
Start: 2023-10-27 — End: 2023-11-19

## 2023-10-27 MED ORDER — CIPROFLOXACIN HCL 500 MG PO TABS
500 | ORAL_TABLET | Freq: Two times a day (BID) | ORAL | 0 refills | Status: AC
Start: 2023-10-27 — End: 2023-11-06

## 2023-10-27 NOTE — Progress Notes (Signed)
 [facility]    Maria Valdez Valdez presents to the office today for   Chief Complaint   Patient presents with    Urinary Tract Infection    Lower Back Pain     Low back pain:  unchanged, for weeks, reports her back feels numb, abdominal pain patient thinks from back pain, stopped at Dr. Harrie Limb office today and asked if could be from insulin pump and patient was told unlikely  Pain described as "pinchy," will come around to front of hips, describes as itchy numbness that comes to the top of her back  Denies radiation into back of leg, will have some in front of leg; denies fevers or chills  Chiropractor with several visits, minimal relief  Denies taking muscle relaxer, reports taking combo Advil/Tylenol pill  Denies changes to bowel or bladder habits; denies saddle paresthesia  Pain improved with standing, moving, pain currently 12/10  Does not tolerate heating pad for long  Patient reports chest pain and shortness of breath secondary to the back pain, she reports she will "work herself up into an episode"    Reports hx UTIs requiring hospitalization  End of February/March started getting low back ache again, seen in ED 09/21/23 and dc with omnicef, hydroxyzine, ibuprofen, lidocaine patches, tizanidine  After 7 days no relief in back, diagnosed with acute bilateral low back pain without sciatica, given steroids which relieved back pain; patient reports as soon as she finished steroid pack her pain returned  CT lumbar spine 09/21/23 demonstrated no acute osseous abnormality involving the lumbar spine, moderate right neural foraminal narrowing L5/S1, MRI may be warranted for further eval    Appt to establish with Dr. Annabelle Barrack 11/19/23.    Review of Systems   As above.    BP (!) 142/98   Pulse 79   Temp 97.9 F (36.6 C) (Temporal)   Resp 16   Ht 1.575 m (5\' 2" )   Wt 60.3 kg (133 lb)   BMI 24.33 kg/m   Physical Exam  Constitutional:       General: She is not in acute distress.  HENT:      Head:  Normocephalic and atraumatic.   Eyes:      Extraocular Movements: Extraocular movements intact.   Cardiovascular:      Rate and Rhythm: Normal rate and regular rhythm.      Heart sounds: No murmur heard.     No friction rub. No gallop.   Pulmonary:      Breath sounds: Normal breath sounds. No wheezing, rhonchi or rales.   Abdominal:      Tenderness: There is no abdominal tenderness. There is no guarding.   Musculoskeletal:      Right lower leg: No edema.      Left lower leg: No edema.      Comments: Patient with tenderness to palpation of bilateral low back   Neurological:      Mental Status: She is alert.      Sensory: No sensory deficit.      Motor: No weakness.            Current Outpatient Medications:     gabapentin (NEURONTIN) 100 MG capsule, Take 1 capsule by mouth 2 times daily for 30 days. Intended supply: 30 days, Disp: 60 capsule, Rfl: 0    ciprofloxacin (CIPRO) 500 MG tablet, Take 1 tablet by mouth 2 times daily for 10 days, Disp: 20 tablet, Rfl: 0    methIMAzole (TAPAZOLE) 10 MG  tablet, 1 tablets 5 days a week, none on Saturday and Sunday, Disp: 90 tablet, Rfl: 3    Glucagon (BAQSIMI ONE PACK) 3 MG/DOSE POWD, For severe hypoglycemia, Disp: 1 each, Rfl: 1    glucose monitoring kit, 1 kit by Does not apply route daily, Disp: 1 kit, Rfl: 0    blood glucose monitor strips, Test 4 times a day & as needed for symptoms of irregular blood glucose. Dispense sufficient amount for indicated testing frequency plus additional to accommodate PRN testing needs., Disp: 400 strip, Rfl: 3    Lancets MISC, 1 each by Does not apply route 4 times daily, Disp: 400 each, Rfl: 3    blood glucose test strips (EXACTECH TEST) strip, 1 each by In Vitro route daily As needed., Disp: 100 each, Rfl: 3    vitamin D 50 MCG (2000 UT) CAPS capsule, Take 1 capsule by mouth daily, Disp: , Rfl:     HUMALOG KWIKPEN 100 UNIT/ML SOPN, Inject 6 units 3 times daily with meals plus low-dose insulin sliding scale, Disp: 5 Adjustable Dose  Pre-filled Pen Syringe, Rfl: 4    propranolol (INDERAL LA) 120 MG extended release capsule, Take 1 capsule by mouth daily, Disp: , Rfl:     Elviteg-Cobic-Emtricit-TenofAF (GENVOYA) 150-150-200-10 MG TABS, Take 1 tablet by mouth daily, Disp: , Rfl:     ibuprofen (ADVIL;MOTRIN) 800 MG tablet, Take 1 tablet by mouth 2 times daily as needed for Pain (Patient not taking: Reported on 10/27/2023), Disp: 20 tablet, Rfl: 0    insulin lispro (HUMALOG,ADMELOG) 100 UNIT/ML SOLN injection vial, Via insulin pump max dose 40 units daily. (Patient not taking: Reported on 10/27/2023), Disp: 20 mL, Rfl: 5    atorvastatin (LIPITOR) 20 MG tablet, Take 1 tablet by mouth daily (Patient not taking: Reported on 10/27/2023), Disp: , Rfl:     ferrous sulfate (IRON 325) 325 (65 Fe) MG tablet, Take 1 tablet by mouth daily (with breakfast) (Patient not taking: Reported on 10/27/2023), Disp: , Rfl:     metFORMIN (GLUCOPHAGE) 1000 MG tablet, Take 1 tablet by mouth (Patient not taking: Reported on 10/27/2023), Disp: , Rfl:      Past Medical History:   Diagnosis Date    Diabetes mellitus (HCC)     HIV infection (HCC)     HTN (hypertension) 08/19/2011    Thyroid disease     Tobacco use disorder affecting pregnancy, antepartum     Tobacco use disorder affecting pregnancy, antepartum        Maria Valdez Valdez was seen today for urinary tract infection and lower back pain.    Diagnoses and all orders for this visit:    Foraminal stenosis of lumbosacral region    Acute low back pain, unspecified back pain laterality, unspecified whether sciatica present  -     POCT Urinalysis no Micro  -     gabapentin (NEURONTIN) 100 MG capsule; Take 1 capsule by mouth 2 times daily for 30 days. Intended supply: 30 days    Urinary tract infection with hematuria, site unspecified  -     ciprofloxacin (CIPRO) 500 MG tablet; Take 1 tablet by mouth 2 times daily for 10 days  -     Culture, Urine; Future       Explained limitations of express  Escalate antibiotic treatment  Start  Gabapentin  Back to ER if symptoms worsen    [name], MD

## 2023-10-29 LAB — CULTURE, URINE

## 2023-11-14 ENCOUNTER — Inpatient Hospital Stay
Admit: 2023-11-14 | Discharge: 2023-11-15 | Disposition: A | Discharge status: Home / Self Care | Payer: PRIVATE HEALTH INSURANCE | Arrived: WI | Attending: Emergency Medicine

## 2023-11-14 ENCOUNTER — Emergency Department: Admit: 2023-11-15 | Payer: PRIVATE HEALTH INSURANCE

## 2023-11-14 DIAGNOSIS — M545 Low back pain, unspecified: Secondary | ICD-10-CM

## 2023-11-14 DIAGNOSIS — G8929 Other chronic pain: Secondary | ICD-10-CM

## 2023-11-14 LAB — URINALYSIS WITH MICROSCOPIC
Bilirubin, Urine: NEGATIVE
Epithelial Cells, UA: 3 /HPF
Glucose, Ur: NEGATIVE mg/dL
Nitrite, Urine: NEGATIVE
Protein, UA: NEGATIVE mg/dL
RBC, UA: 0 /HPF
Specific Gravity, UA: 1.025 (ref 1.005–1.030)
Urine Hgb: NEGATIVE
Urobilinogen, Urine: 0.2 EU/dL (ref 0.0–1.0)
WBC, UA: 0 /HPF
pH, Urine: 6 (ref 5.0–8.0)

## 2023-11-14 NOTE — ED Provider Notes (Signed)
 Department of Emergency Medicine     Written by: Allyson Jack, DO  Patient Name: Maria Valdez  Admit Date: 11/14/2023  8:08 PM  MRN: [id number]                   DOB: January 13, 1966      CHIEF COMPLAINT     Chief Complaint   Patient presents with    Back Pain     Chronic uti. Reports kidney stones. Reports increased pains.      HPI     Maria Valdez is a 58 y.o. female who presents to the emergency department today complaining of bilateral flank and back pain.  Patient states that the beginning of March, she was having some back pain and dysuria and was seen in the emergency department and was diagnosed with a kidney stone that was still in the kidney and a urinary infection.  She was put on antibiotics at that time.  She states her pain really did not get any better.  She then went back to an urgent care and was told that the urine infection had cleared up but she was given a dose of steroids and then a short course of the to take at home and states that these did really help her pain although she is diabetic and she was not given a long course because of this.  Patient denies any lightheadedness or dizziness, fever or chills, nausea or vomiting, chest pain, shortness of breath, hematuria, constipation or diarrhea.    Nursing notes were all reviewed and agreed with or any disagreements were addressed in the HPI.    REVIEW OF SYSTEMS:    Pertinent positives and negatives mentioned in the HPI/MDM.     REVIEW OF OUTSIDE RECORDS: Chart reviewed from 10/27/2023 family medicine walk-in visit where patient was seen for low back pain.    SOCIAL DETERMINANTS OF HEALTH: Patient has good medical compliance and fluency as well as established follow-up with family physician.    PAST HISTORY     I personally reviewed the following history:    Past Medical History:  has a past medical history of Diabetes mellitus (HCC), HIV infection (HCC), HTN (hypertension), Thyroid disease, Tobacco use disorder affecting pregnancy,  antepartum, and Tobacco use disorder affecting pregnancy, antepartum.    Past Surgical History:  has a past surgical history that includes Thyroid surgery and Cholecystectomy.    Social History:  reports that she has been smoking cigarettes. She has never used smokeless tobacco. She reports that she does not drink alcohol and does not use drugs.    Family History: family history is not on file.     The patient's home medications have been reviewed.    Allergies: Sulfa antibiotics and Codeine    NURSING NOTES AND VITALS REVIEWED     Date / Time Roomed:  11/14/2023  8:08 PM  ED Bed Assignment:  [id number]/HALL-12    The nursing notes within the ED encounter and initial vital signs as below have been reviewed.   Patient Vitals for the past 24 hrs:   BP Temp Temp src Pulse Resp SpO2 Height Weight   11/15/23 0231 128/87 97.9 F (36.6 C) Oral 80 18 96 % -- --   11/14/23 2210 -- -- -- -- 20 -- -- --   11/14/23 2139 -- -- -- -- -- -- 1.575 m (5\' 2" ) 59 kg (130 lb)   11/14/23 2130 (!) 155/74 97.9 F (36.6 C) Oral 83 18  99 % -- --   11/14/23 1830 101/63 97.9 F (36.6 C) Oral 98 18 96 % -- --   11/14/23 1744 -- -- -- (!) 104 20 99 % -- --       PHYSICAL EXAM     Physical Exam  Vitals and nursing note reviewed.   Constitutional:       General: She is not in acute distress.  HENT:      Head: Normocephalic and atraumatic.   Eyes:      Extraocular Movements: Extraocular movements intact.      Pupils: Pupils are equal, round, and reactive to light.   Cardiovascular:      Rate and Rhythm: Normal rate and regular rhythm.   Pulmonary:      Effort: Pulmonary effort is normal.      Breath sounds: Normal breath sounds. No stridor. No wheezing, rhonchi or rales.   Abdominal:      General: Abdomen is flat. There is no distension.      Palpations: Abdomen is soft.      Tenderness: There is no guarding.   Musculoskeletal:         General: No deformity. Normal range of motion.      Cervical back: Normal range of motion.   Skin:     Capillary  Refill: Capillary refill takes less than 2 seconds.      Coloration: Skin is not jaundiced.      Findings: No rash.   Neurological:      General: No focal deficit present.      Mental Status: She is oriented to person, place, and time.   Psychiatric:         Mood and Affect: Mood normal.         Behavior: Behavior normal.         DIAGNOSTIC RESULTS     I have personally interpreted all laboratory, EKG, and imaging results for this patient. Results are listed below.     LABS:  Labs Reviewed   URINALYSIS WITH MICROSCOPIC - Abnormal; Notable for the following components:       Result Value    Ketones, Urine TRACE (*)     Leukocyte Esterase, Urine TRACE (*)     All other components within normal limits   COMPREHENSIVE METABOLIC PANEL - Abnormal; Notable for the following components:    Glucose 118 (*)     Alkaline Phosphatase 130 (*)     All other components within normal limits   CULTURE, URINE   CBC WITH AUTO DIFFERENTIAL   LACTIC ACID       As interpreted by me, the above displayed labs are abnormal. All other labs obtained during this visit were within normal range or not returned as of this dictation.    No EKG obtained during this patient's visit.    RADIOLOGY:  Non-plain film images such as CT, Ultrasound and MRI are read by the radiologist. Plain radiographic images are visualized and preliminarily interpreted by the ED Provider as mentioned in the MDM section below.    Interpretation per the Radiologist below, if available at the time of this note:    CT ABDOMEN PELVIS W IV CONTRAST Additional Contrast? None   Final Result   1. No acute intra-abdominal or pelvic abnormality.   2. Punctate nonobstructing left renal calculus.   3. Diverticulosis.           No results found.    No results found.  MDM     History is obtained from patient.  58 y.o. female presented to the emergency department today because she had concerns including Back Pain (Chronic uti. Reports kidney stones. Reports increased pains. ).  On  exam, patient with stable vital signs.  No significant tenderness to palpation.  No rash noted.  Differential diagnoses considered include but not limited to nephrolithiasis, pyelonephritis, electrolyte abnormality, anemia, musculoskeletal back pain.  CBC with no leukocytosis or anemia.  CMP is reassuring.  Urinalysis with no signs of acute infection.  Lactic acid normal at 0.8.  Urine culture was sent.  Patient given Toradol  and Norflex initially without much improvement.  Then given a dose of Norco and patient feeling improved.  CT abdomen pelvis shows a punctate left kidney stone.  No other concerning findings.  Patient has had multiple extensive workup for this in the recent past as well.  Do not feel any acute emergent pathology is ongoing at this time.  Patient given another dose of Norco here in the ED.  She does have appointment with PCP scheduled in the next couple of days.    Patient has been closely monitored with multiple reevaluations and has remained hemodynamically stable.  They have clinically improved and through shared decision making, patient is to be discharged to home.  Patient is understanding and agreeable with this plan.  They have been instructed to follow-up with PCP as soon as possible and are provided with strict return precautions as to which they should return to the nearest available emergency department.  I have discussed with the patient the use and purpose of any prescription medications outpatient after they pick it up from their pharmacy.  Patient acknowledges understanding of all of these instructions and is to be discharged at this time.    OUTPATIENT MEDICATIONS PRESCRIBED, IF APPLICABLE:  Current Discharge Medication List        START taking these medications    Details   HYDROcodone -acetaminophen  (NORCO) 5-325 MG per tablet Take 1 tablet by mouth every 6 hours as needed for Pain for up to 3 days. Intended supply: 3 days. Take lowest dose possible to manage pain Max Daily  Amount: 4 tablets  Qty: 10 tablet, Refills: 0    Associated Diagnoses: Chronic bilateral low back pain, unspecified whether sciatica present             CONSULTS: discussion with bolded "IP consult", otherwise consult was likely placed by admitting service. Details of conversations below in ED Course.  None    Please see ED Course section below for further MDM       DECISION MAKING TOOLS/RISK STRATIFICATION   Is this patient to be included in the SEP-1 core measure? No Exclusion criteria - the patient is NOT to be included for SEP-1 Core Measure due to: Infection is not suspected    PROCEDURES   Unless otherwise listed below, none    ADDITIONAL PROVIDER NOTES     I have spoken to the patient and/or family regarding results and the proposed plan for disposition.  They are comfortable with management and treatment plans as discussed.    DIAGNOSIS & DISPOSITION     CLINICAL IMPRESSION:  1. Chronic bilateral low back pain, unspecified whether sciatica present          DISPOSITION:  Patient's disposition: Decision To Discharge 11/15/2023 02:24:45 AM  Patient condition is stable    (Please note that portions of this note were completed with a voice recognition program.  Efforts were made to edit the dictations but occasionally words are mis-transcribed.)     Allyson Jack, DO

## 2023-11-14 NOTE — ED Notes (Signed)
 Department of Emergency Medicine  FIRST PROVIDER TRIAGE NOTE             [facility]           11/14/23  6:32 PM EDT    Date of Encounter: 11/14/23   MRN: [id number]      HPI: LARICA YOGI is a 58 y.o. female who presents to the ED for Back Pain (Chronic uti. Reports kidney stones. Reports increased pains. )     REPORTS BEING ON STEROIDS FOR 6 DAYS IN THE PAST. WHILE ON STEROIDS, THE PAIN WAS GONE.      ROS: Negative for fever or cough.    PE: Gen Appearance/Constitutional: alert  HEENT: NC/NT. PERRLA,  Airway patent.    Provider-Patient relationship only established for Provider In Triage (PIT)  Full assessment, HPI and examination not performed, therefore, it is not yet possible to state whether or not an emergency medical condition exists   Focused assessment only during triage. This is not a comprehensive evaluation due to no physical ER space, staff shortage and high numbers of boarding patients in the ER.       Initial Plan of Care: All treatment areas with department are currently occupied. Plan to order/Initiate the following while awaiting opening in ED.  Initiate Treatment-Testing, Proceed toTreatment Area When Bed Available for ED Attending/MLP to Continue Care    Electronically signed by Burnis Carver, APRN - CNP   DD: 11/14/23      Burnis Carver, APRN - CNP  11/14/23 [phone]

## 2023-11-14 NOTE — ED Notes (Signed)
 Safe EnvironmentArm Bands On: ID; AllergiesPatient has limb restriction?: NoSafety Measures: Standard Safety Measures; Bed/Chair-Wheels locked; Bed in low position; Gripper socks; Caregiver at bedside  Fall Risk InterventionsToilet Every 2 Hours-In Advance of Need: YesHourly Visual Checks: Awake; In bedRoom Door Open: Technical sales engineer Closer to Nursing Station: [employer] Posted: SocksNursing Judgement-Fall Risk High(Add Comments): No  HygieneHygiene: Peri careLevel of Assistance: Independent  PrecautionsPrecautions: NoneNegative Pressure Room: NoPositive Pressure Room: No  Family/Significant Other CommunicationFamily/Significant Other Update: Updated; Visiting  Comfort and Environment InterventionsComfort: Warm blanketWarming Blanket: Engineer, structural CompanionAlternatives to [employer]: Comfort Measures; Decrease Stimulation; Eliminate Invasive Treatment; Increased Frequency of Nursing Rounds  Restart TimerAutomatic Restart Rounding Timer: Yes  NutritionNPO: NoFeeding: Able to feed self - Independent  TelemetryCardiac/Telemetry Monitor On: No  MobilityActivity: In bed; To bathroom; Ambulate in hall; Stand at bedside; Up to chairAmbulation Response: Tolerated wellLevel of Assistance: IndependentHead of Bed Elevated : Self regulated M

## 2023-11-14 NOTE — ED Provider Notes (Signed)
 Emergency Department Encounter  [facility] EMERGENCY DEPARTMENT    Patient: Keishawna  MRN: [id number]  DOB: April 22, 1966  Date of Evaluation: 11/14/2023  ED Supervising Physician: Jolyn Needles, MD    I personally evaluated Val Garin and made/approved the management plan and take responsibility for the patient management.    This will serve as my Supervisory note and shared attestation.  I did perform a substantive portion of the visit including all aspects of the Medical Decision Making.    I wore appropriate PPE for the entirety of this encounter.    In brief, Jesica is a 58 y.o. that presents to the emergency department back pains been ongoing for 2 months she has been intermittently treated with antibiotics for UTI    Focused exam: Vitals are stable strength and sensations intact in lower extremities no difficulty ambulating moving her    Brief ED course/MDM: Patient presenting with back pain she has been treated for multiple UTIs today her UA does not show UTI she does have some pain wrapping around to the abdomen.  Is not peritonitic.  Will check labs and a CT if everything comes back normal given the chronicity of symptoms will discharge.  Given no urinary retention numbness or weakness lower extremities fevers or immunosuppression other than poorly controlled diabetes,, low suspicion for epidural abscess or epidural compression syndrome    Diagnostics interpreted by me:      I personally discussed the patient's management with other clinicians:    All diagnostic, treatment, and disposition decisions were made by myself in conjunction with the Resident. I also supervised key portions of any procedures performed by the Resident. For all further details of the patient's emergency department visit, please see their documentation.    (Comment: Please note this report has been produced using speech recognition software and may contain errors related to that system including  errors in grammar, punctuation, and spelling, as well as words and phrases that may be inappropriate.  If there are any questions or concerns please feel free to contact the dictating provider for clarification.)    Jolyn Needles, MD  [facility]  [employer]       Jolyn Needles, MD  11/14/23 2057

## 2023-11-15 LAB — CBC WITH AUTO DIFFERENTIAL
Basophils %: 1 % (ref 0.0–2.0)
Basophils Absolute: 0.06 10*3/uL (ref 0.00–0.20)
Eosinophils %: 3 % (ref 0–6)
Eosinophils Absolute: 0.22 10*3/uL (ref 0.05–0.50)
Hematocrit: 39.8 % (ref 34.0–48.0)
Hemoglobin: 13.6 g/dL (ref 11.5–15.5)
Immature Granulocytes %: 0 % (ref 0.0–5.0)
Immature Granulocytes Absolute: <0.03 10*3/uL (ref 0.00–0.58)
Lymphocytes %: 36 % (ref 20.0–42.0)
Lymphocytes Absolute: 2.83 10*3/uL (ref 1.50–4.00)
MCH: 30.5 pg (ref 26.0–35.0)
MCHC: 34.2 g/dL (ref 32.0–34.5)
MCV: 89.2 fL (ref 80.0–99.9)
MPV: 9.6 fL (ref 7.0–12.0)
Monocytes %: 8 % (ref 2.0–12.0)
Monocytes Absolute: 0.6 10*3/uL (ref 0.10–0.95)
Neutrophils %: 53 % (ref 43.0–80.0)
Neutrophils Absolute: 4.18 10*3/uL (ref 1.80–7.30)
Platelets: 325 10*3/uL (ref 130–450)
RBC: 4.46 m/uL (ref 3.50–5.50)
RDW: 13.2 % (ref 11.5–15.0)
WBC: 7.9 10*3/uL (ref 4.5–11.5)

## 2023-11-15 LAB — COMPREHENSIVE METABOLIC PANEL
ALT: 32 U/L (ref 0–32)
AST: 20 U/L (ref 0–31)
Albumin: 4.1 g/dL (ref 3.5–5.2)
Alkaline Phosphatase: 130 U/L — ABNORMAL HIGH (ref 35–104)
Anion Gap: 14 mmol/L (ref 7–16)
BUN: 11 mg/dL (ref 6–20)
CO2: 23 mmol/L (ref 22–29)
Calcium: 9.6 mg/dL (ref 8.6–10.2)
Chloride: 104 mmol/L (ref 98–107)
Creatinine: 0.6 mg/dL (ref 0.50–1.00)
Est, Glom Filt Rate: >90 mL/min/{1.73_m2} (ref >60)
Glucose: 118 mg/dL — ABNORMAL HIGH (ref 74–99)
Potassium: 3.5 mmol/L (ref 3.5–5.0)
Sodium: 141 mmol/L (ref 132–146)
Total Bilirubin: 0.4 mg/dL (ref 0.0–1.2)
Total Protein: 6.7 g/dL (ref 6.4–8.3)

## 2023-11-15 LAB — LACTIC ACID: Lactic Acid: 0.8 mmol/L (ref 0.5–2.2)

## 2023-11-15 MED ORDER — IOPAMIDOL 76 % IV SOLN
76 | Freq: Once | INTRAVENOUS | Status: AC | PRN
Start: 2023-11-15 — End: 2023-11-14
  Administered 2023-11-15: 03:00:00 75 mL via INTRAVENOUS

## 2023-11-15 MED ORDER — HYDROCODONE-ACETAMINOPHEN 5-325 MG PO TABS
5-325 | ORAL_TABLET | Freq: Four times a day (QID) | ORAL | 0 refills | Status: AC | PRN
Start: 2023-11-15 — End: 2023-11-18

## 2023-11-15 MED ORDER — HYDROCODONE-ACETAMINOPHEN 5-325 MG PO TABS
5-325 | Freq: Once | ORAL | Status: AC
Start: 2023-11-15 — End: 2023-11-14
  Administered 2023-11-15: 02:00:00 1 via ORAL

## 2023-11-15 MED ORDER — HYDROCODONE-ACETAMINOPHEN 5-325 MG PO TABS
5-325 | Freq: Once | ORAL | Status: AC
Start: 2023-11-15 — End: 2023-11-15
  Administered 2023-11-15: 07:00:00 1 via ORAL

## 2023-11-15 MED ORDER — ORPHENADRINE CITRATE 30 MG/ML IJ SOLN
30 | Freq: Once | INTRAMUSCULAR | Status: AC
Start: 2023-11-15 — End: 2023-11-14
  Administered 2023-11-15: 01:00:00 60 mg via INTRAMUSCULAR

## 2023-11-15 MED ORDER — KETOROLAC TROMETHAMINE 15 MG/ML IJ SOLN
15 | Freq: Once | INTRAMUSCULAR | Status: AC
Start: 2023-11-15 — End: 2023-11-14
  Administered 2023-11-15: 02:00:00 15 mg via INTRAVENOUS

## 2023-11-15 MED FILL — ORPHENADRINE CITRATE 30 MG/ML IJ SOLN: 30 MG/ML | INTRAMUSCULAR | Qty: 2 | Fill #0

## 2023-11-15 MED FILL — HYDROCODONE-ACETAMINOPHEN 5-325 MG PO TABS: 5-325 MG | ORAL | Qty: 1 | Fill #0

## 2023-11-15 MED FILL — KETOROLAC TROMETHAMINE 15 MG/ML IJ SOLN: 15 MG/ML | INTRAMUSCULAR | Qty: 1 | Fill #0

## 2023-11-15 NOTE — ED Notes (Signed)
 Safe EnvironmentArm Bands On: ID; AllergiesPatient has limb restriction?: NoSafety Measures: Standard Safety Measures; Bed/Chair-Wheels locked; Bed in low position; Gripper socks; Caregiver at bedside  Fall Risk InterventionsToilet Every 2 Hours-In Advance of Need: YesHourly Visual Checks: Awake; In bedRoom Door Open: Technical sales engineer Closer to Nursing Station: [employer] Posted: SocksNursing Judgement-Fall Risk High(Add Comments): No  HygieneHygiene: Peri careLevel of Assistance: Independent  PrecautionsPrecautions: NoneNegative Pressure Room: NoPositive Pressure Room: No  Family/Significant Other CommunicationFamily/Significant Other Update: Updated; Visiting  Comfort and Environment InterventionsComfort: Warm blanketWarming Blanket: Engineer, structural CompanionAlternatives to [employer]: Comfort Measures; Decrease Stimulation; Eliminate Invasive Treatment; Increased Frequency of Nursing Rounds  Restart TimerAutomatic Restart Rounding Timer: Yes  NutritionNPO: NoFeeding: Able to feed self - Independent  TelemetryCardiac/Telemetry Monitor On: No  MobilityActivity: In bed; To bathroom; Ambulate in hall; Stand at bedside; Up to chairAmbulation Response: Tolerated wellLevel of Assistance: IndependentHead of Bed Elevated : Self regulated M

## 2023-11-16 LAB — CULTURE, URINE: Culture: NO GROWTH

## 2023-11-19 ENCOUNTER — Ambulatory Visit: Admit: 2023-11-19 | Discharge: 2023-11-19 | Payer: PRIVATE HEALTH INSURANCE

## 2023-11-19 ENCOUNTER — Encounter

## 2023-11-19 VITALS — BP 130/70 | HR 86 | Temp 97.70000°F | Ht 62.0 in | Wt 130.8 lb

## 2023-11-19 DIAGNOSIS — M9979 Connective tissue and disc stenosis of intervertebral foramina of abdomen and other regions: Secondary | ICD-10-CM

## 2023-11-19 MED ORDER — ATORVASTATIN CALCIUM 20 MG PO TABS
20 | ORAL_TABLET | Freq: Every day | ORAL | 3 refills | 90.00000 days | Status: DC
Start: 2023-11-19 — End: 2024-01-21

## 2023-11-19 MED ORDER — HYDROXYZINE HCL 25 MG PO TABS
25 | ORAL_TABLET | Freq: Three times a day (TID) | ORAL | 1 refills | 30.00 days | Status: AC | PRN
Start: 2023-11-19 — End: ?

## 2023-11-19 MED ORDER — NAPROXEN 500 MG PO TABS
500 | ORAL_TABLET | Freq: Two times a day (BID) | ORAL | 0 refills | 30.00000 days | Status: DC
Start: 2023-11-19 — End: 2023-12-31

## 2023-11-19 MED ORDER — VITAMIN D 50 MCG (2000 UT) PO CAPS
50 | ORAL_CAPSULE | Freq: Every day | ORAL | 3 refills | 84.00000 days | Status: DC
Start: 2023-11-19 — End: 2024-06-07

## 2023-11-19 MED ORDER — FERROUS SULFATE 325 (65 FE) MG PO TABS
325 | ORAL_TABLET | Freq: Every day | ORAL | 3 refills | 30.00000 days | Status: DC
Start: 2023-11-19 — End: 2024-01-21

## 2023-11-19 NOTE — Patient Instructions (Signed)
 Avoid ibuprofen  with Naproxen. Okay to take tylenol .

## 2023-11-19 NOTE — Progress Notes (Unsigned)
 [facility]  Precepting Note    Subjective:  Here to establish  Hx of HIV-follows with ID  Type 1 DM-following with endocrinology  Pcp retired a few years ago  Persistent low back pain-chronic. 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    Objective:    BP 130/70   Pulse 86   Temp 97.7 F (36.5 C)   Ht 1.575 m (5\' 2" )   Wt 59.3 kg (130 lb 12.8 oz)   SpO2 98%   BMI 23.92 kg/m     Exam is as noted by resident with the following changes, additions or corrections:    General:  NAD; alert & oriented x 3   Heart:  RRR, no murmurs, gallops, or rubs.  Lungs:  CTA bilaterally, no wheeze, rales or rhonchi  Abd: bowel sounds present, nontender, nondistended, no masses  Extrem:  No clubbing, cyanosis, or edema    Assessment/Plan:  Continue following with appropriate specialists. No change to medication today  Back pain-MRI ordered, does not have time to go to PT. Home exercises given  Return quickly to discuss breast and colon ca screening       Attending Physician Statement  I have reviewed the chart, including any radiology or labs. I have discussed the case, including pertinent history and exam findings with the resident.  I agree with the assessment, plan and orders as documented by the resident.  Please refer to the resident note for additional information.      Electronically signed by Suzanne Erps, MD on 11/19/2023 at 3:23 PM

## 2023-11-19 NOTE — Progress Notes (Unsigned)
 [facility]  Department of Family Medicine  Family Medicine Residency Program      Patient: Maria Valdez 58 y.o. female     Date of Service: 11/19/23      Chief complaint:   Chief Complaint   Patient presents with    Establish Care    Back Pain     Asking for MRI order       HISTORY OF PRESENTING ILLNESS     58 y.o. female presented to the clinic to establish with me.    PMH T1DM follows with endocrinology, HIV infection following with ID, HTN    Back pain- chronic issue since about March 2024. CT of lumbar spine in march 2025 with " Moderate right neural foraminal narrowing at L5/S1. No significant central canal stenosis.  MRI may be warranted for further evaluation." With her persistent symptoms and paraesthesias would get MRI of the lumbar spine. Gabapentin  did not help.  Is anxious about this pain. Seen at chiropractor without relief. Steroid pack helped in past. Feels like she has to lean forward to feel less pain.     HIV- Last viral lod was done in 2015. Taking Genvoya. Follows with Dr Arita Kyle.  Daughter did not contract it from her.     HTN- on propranalol 120 mg well controlled       Health Maintenance:  Health Maintenance Due   Topic Date Due    Meningococcal (ACWY) vaccine (1 - Risk 2-dose series) Never done    COVID-19 Vaccine (1) Never done    Diabetic foot exam  Never done    Depression Screen  Never done    Diabetic Alb to Cr ratio (uACR) test  Never done    Measles,Mumps,Rubella (MMR) vaccine (1 of 2 - Risk 2-dose series) Never done    Hepatitis A vaccine (1 of 2 - Risk 2-dose series) Never done    Hepatitis B vaccine (1 of 3 - 19+ 3-dose series) Never done    DTaP/Tdap/Td vaccine (1 - Tdap) Never done    Shingles vaccine (1 of 2) Never done    Pneumococcal 50+ years Vaccine (1 of 2 - PCV) Never done    Cervical cancer screen  Never done    Breast cancer screen  Never done    Colorectal Cancer Screen  Never done    A1C test (Diabetic or Prediabetic)  08/08/2023     Past  Medical History:      Diagnosis Date    Diabetes mellitus (HCC)     HIV infection (HCC)     HTN (hypertension) 08/19/2011    Thyroid disease     Tobacco use disorder affecting pregnancy, antepartum     Tobacco use disorder affecting pregnancy, antepartum      Past Surgical History:        Procedure Laterality Date    CHOLECYSTECTOMY      THYROID SURGERY       Allergies:    Sulfa antibiotics and Codeine  Social History:   Social History     Socioeconomic History    Marital status: Divorced     Spouse name: Not on file    Number of children: Not on file    Years of education: Not on file    Highest education level: Not on file   Occupational History    Not on file   Tobacco Use    Smoking status: Every Day     Current packs/day: 0.50  Types: Cigarettes    Smokeless tobacco: Never   Substance and Sexual Activity    Alcohol use: No    Drug use: No    Sexual activity: Yes     Partners: Male   Other Topics Concern    Not on file   Social History Narrative    Not on file     Social Drivers of Health     Financial Resource Strain: Not on file   Food Insecurity: No Food Insecurity (05/07/2023)    Hunger Vital Sign     Worried About Running Out of Food in the Last Year: Never true     Ran Out of Food in the Last Year: Never true   Transportation Needs: Unmet Transportation Needs (05/07/2023)    PRAPARE - Therapist, art (Medical): Yes     Lack of Transportation (Non-Medical): Yes   Physical Activity: Not on file   Stress: Not on file   Social Connections: Not on file   Intimate Partner Violence: Not on file   Housing Stability: High Risk (05/07/2023)    Housing Stability Vital Sign     Unable to Pay for Housing in the Last Year: Yes     Number of Times Moved in the Last Year: 0     Homeless in the Last Year: No      Family History:   No family history on file.  Review of Systems:   Review of Systems   Constitutional:  Negative for chills and fever.   Respiratory:  Negative for cough, chest tightness  and shortness of breath.    Cardiovascular:  Negative for chest pain and palpitations.   Gastrointestinal:  Negative for abdominal pain, blood in stool, constipation, diarrhea, nausea and vomiting.   Genitourinary:  Negative for difficulty urinating and hematuria.   Musculoskeletal:  Positive for arthralgias, back pain, gait problem, myalgias and neck pain.   Neurological:  Positive for numbness. Negative for weakness and light-headedness.         PHYSICAL EXAM   Vitals: BP 130/70   Pulse 86   Temp 97.7 F (36.5 C)   Ht 1.575 m (5\' 2" )   Wt 59.3 kg (130 lb 12.8 oz)   SpO2 98%   BMI 23.92 kg/m   Physical Exam  Constitutional:       General: She is not in acute distress.  HENT:      Head: Normocephalic and atraumatic.   Eyes:      Extraocular Movements: Extraocular movements intact.   Cardiovascular:      Rate and Rhythm: Normal rate and regular rhythm.      Heart sounds: No murmur heard.     No friction rub. No gallop.   Pulmonary:      Breath sounds: Normal breath sounds. No wheezing, rhonchi or rales.   Abdominal:      Tenderness: There is no abdominal tenderness. There is no guarding.   Musculoskeletal:      Right lower leg: No edema.      Left lower leg: No edema.      Comments: Patient with tenderness to palpation of bilateral low back   Neurological:      General: No focal deficit present.      Mental Status: She is alert.      Cranial Nerves: No cranial nerve deficit.      Sensory: No sensory deficit.      Motor: No weakness.  Gait: Gait abnormal (Patient does lean slightly forward with ambulation in her upper body).   Psychiatric:      Comments: Patient did become tearful when discussing symptoms             ASSESSMENT AND PLAN     1. 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  Patient does have some numbness tingling in bilateral buttocks wrapping around into her anterior thighs into radicular-like pattern.   Patient does have foraminal stenosis at the L5-S1 region noted on CT scan.  Patient history and symptoms also concerning for potential spinal stenosis.  I would like to obtain an MRI of her lumbar spine to assess this.  Discussed home exercises physical therapy with patient.  Would like to refer to pain management as patient does have known foraminal stenosis which is likely a large component of her pain, would like patient to discuss potential procedures which may help alleviate this.  Consider Cymbalta in the future as well as it may help with her pain and mood.  - MRI LUMBAR SPINE W WO CONTRAST; Future  - [facility] - Clifton Dames, MD, Pain Medicine, [place]    2. Primary hypertension  Well-controlled, continue propranolol , check CBC and CMP  - CBC; Future  - Comprehensive Metabolic Panel; Future    3. Asymptomatic human immunodeficiency virus (HIV) infection status (HCC)  Well-controlled, follows with infectious disease closely    4. Type 1 diabetes mellitus without complication (HCC)  Follows with endocrinology, will check a lipid panel, will refill her Lipitor  - Lipid Panel; Future  - atorvastatin  (LIPITOR) 20 MG tablet; Take 1 tablet by mouth daily  Dispense: 30 tablet; Refill: 3    5. Leg numbness  As above, with the bilateral leg numbness concern for potential nerve compression or spinal stenosis  - MRI LUMBAR SPINE W WO CONTRAST; Future    6. Anxiety  Will continue Atarax , consider Cymbalta treatment in the future to help with both anxiety and back pain  - hydrOXYzine  HCl (ATARAX ) 25 MG tablet; Take 1 tablet by mouth every 8 hours as needed for Itching  Dispense: 30 tablet; Refill: 1    7. Vitamin D  deficiency  Check a vitamin D  level, continue supplementation  - vitamin D  50 MCG (2000 UT) CAPS capsule; Take 1 capsule by mouth daily  Dispense: 30 capsule; Refill: 3  - Vitamin D  25 Hydroxy; Future    8. Iron deficiency anemia, unspecified iron deficiency anemia type  Check iron studies, continue  supplementation  - ferrous sulfate  (IRON 325) 325 (65 Fe) MG tablet; Take 1 tablet by mouth daily (with breakfast)  Dispense: 30 tablet; Refill: 3  - Iron and TIBC; Future  - Ferritin; Future    Will discuss health maintenance next visit      Counseled regarding above diagnosis, including possible risks and complications, especially if left uncontrolled. Counseled regarding the possible side effects, risks, benefits and alternatives to treatment; patient and/or guardian verbalizes understanding, agrees, feels comfortable with and wishes to proceed with above treatment plan.    Call or go to ED immediately if symptoms worsen or persist. Advised patient to call with any new medication issues, and, as applicable, read all Rx info from pharmacy to assure aware of all possible risks and side effects of medication before taking.     Patient and/or guardian given opportunity to ask questions/raise concerns.The patient verbalized comfort and  understanding of instructions.    I encourage further reading and education about your health conditions.Information on many health conditions is provided by the American Academy of Family Physicians: https://familydoctor.org/  Please bring any questions to me at your next visit.    Medication List:    Current Outpatient Medications   Medication Sig Dispense Refill    HYDROcodone -acetaminophen  (NORCO ) 5-325 MG per tablet Take 1 tablet by mouth every 6 hours as needed for Pain. Max Daily Amount: 4 tablets      hydrOXYzine  HCl (ATARAX ) 25 MG tablet Take 1 tablet by mouth every 8 hours as needed for Itching 30 tablet 1    atorvastatin  (LIPITOR) 20 MG tablet Take 1 tablet by mouth daily 30 tablet 3    vitamin D  50 MCG (2000 UT) CAPS capsule Take 1 capsule by mouth daily 30 capsule 3    ferrous sulfate  (IRON 325) 325 (65 Fe) MG tablet Take 1 tablet by mouth daily (with breakfast) 30 tablet 3    naproxen  (NAPROSYN ) 500 MG tablet Take 1 tablet by mouth 2 times daily (with meals) 60 tablet 0     methIMAzole  (TAPAZOLE ) 10 MG tablet 1 tablets 5 days a week, none on Saturday and Sunday 90 tablet 3    Glucagon  (BAQSIMI  ONE PACK) 3 MG/DOSE POWD For severe hypoglycemia 1 each 1    insulin  lispro (HUMALOG ,ADMELOG ) 100 UNIT/ML SOLN injection vial Via insulin  pump max dose 40 units daily. 20 mL 5    propranolol  (INDERAL  LA) 120 MG extended release capsule Take 1 capsule by mouth daily      Elviteg-Cobic-Emtricit-TenofAF (GENVOYA) 150-150-200-10 MG TABS Take 1 tablet by mouth daily       No current facility-administered medications for this visit.      Return to Office: Return in about 2 months (around 01/19/2024) for back pain follow up, MRI results .      This document may have been prepared at least partially through the use of voice recognition software. Although effort is taken to assure the accuracy of this document, it is possible that grammatical, syntax,  or spelling errors may occur.    Sonny Dust, MD

## 2023-11-20 LAB — LIPID PANEL
Cholesterol, Total: 185 mg/dL (ref <200)
HDL: 73 mg/dL (ref >40)
LDL Cholesterol: 94 mg/dL (ref <100)
Triglycerides: 91 mg/dL (ref <150)
VLDL: 18 mg/dL

## 2023-11-20 LAB — COMPREHENSIVE METABOLIC PANEL
ALT: 27 U/L (ref 0–35)
AST: 27 U/L (ref 0–35)
Albumin: 4 g/dL (ref 3.5–5.2)
Alkaline Phosphatase: 122 U/L — ABNORMAL HIGH (ref 35–104)
Anion Gap: 12 mmol/L (ref 7–16)
BUN: 11 mg/dL (ref 6–20)
CO2: 24 mmol/L (ref 22–29)
Calcium: 9.5 mg/dL (ref 8.6–10.0)
Chloride: 104 mmol/L (ref 98–107)
Creatinine: 0.5 mg/dL (ref 0.5–1.0)
Est, Glom Filt Rate: >90 mL/min/{1.73_m2} (ref >60)
Glucose: 224 mg/dL — ABNORMAL HIGH (ref 74–99)
Potassium: 3.8 mmol/L (ref 3.5–5.1)
Sodium: 140 mmol/L (ref 136–145)
Total Bilirubin: 0.4 mg/dL (ref 0.0–1.2)
Total Protein: 6.6 g/dL (ref 6.4–8.3)

## 2023-11-20 LAB — CBC
Hematocrit: 40.8 % (ref 34.0–48.0)
Hemoglobin: 13.7 g/dL (ref 11.5–15.5)
MCH: 30.7 pg (ref 26.0–35.0)
MCHC: 33.6 g/dL (ref 32.0–34.5)
MCV: 91.5 fL (ref 80.0–99.9)
MPV: 10.7 fL (ref 7.0–12.0)
Platelets: 321 10*3/uL (ref 130–450)
RBC: 4.46 m/uL (ref 3.50–5.50)
RDW: 12.9 % (ref 11.5–15.0)
WBC: 6.8 10*3/uL (ref 4.5–11.5)

## 2023-11-20 LAB — IRON AND TIBC
Iron % Saturation: 24 % (ref 15–50)
Iron: 81 ug/dL (ref 37–145)
TIBC: 334 ug/dL (ref 250–450)

## 2023-11-20 LAB — VITAMIN D 25 HYDROXY: Vit D, 25-Hydroxy: 39.3 ng/mL (ref 30.0–100.0)

## 2023-11-20 LAB — FERRITIN: Ferritin: 94 ng/mL

## 2023-11-22 ENCOUNTER — Encounter

## 2023-11-22 MED ORDER — DULOXETINE HCL 20 MG PO CPEP
20 | ORAL_CAPSULE | Freq: Every day | ORAL | 0 refills | 90.00000 days | Status: DC
Start: 2023-11-22 — End: 2024-01-01

## 2023-11-22 NOTE — Telephone Encounter (Signed)
 Received call from patient stating that she was seen in clinic few days ago secondary to back pain and she was started on Aleve  which is not helping her.  Talk with patient patient said that she has history of back pain issues and she is due for her MRI.  She mentioned that her pain is not controlled with Aleve  and she wants to try gabapentin  or something else.  As per chart review per Dr. Annabelle Barrack , Cymbalta was discussed to her.    Sending prescription for Cymbalta.  Educated patient about effects and side effects.  Educated patient in case of worsening of the symptoms or uncontrolled pain go to ER.  Patient agrees with the plan.    Advised patient to come to clinic next week to discuss further about increasing Cymbalta dose or gabapentin  if she is not seeing any effect.

## 2023-12-03 ENCOUNTER — Ambulatory Visit: Admit: 2023-12-03 | Discharge: 2023-12-03 | Payer: PRIVATE HEALTH INSURANCE | Attending: "Endocrinology

## 2023-12-03 VITALS — BP 143/88 | HR 77 | Temp 97.40000°F | Resp 18 | Ht 62.0 in | Wt 131.0 lb

## 2023-12-03 DIAGNOSIS — E1065 Type 1 diabetes mellitus with hyperglycemia: Secondary | ICD-10-CM

## 2023-12-03 LAB — POCT GLYCOSYLATED HEMOGLOBIN (HGB A1C): Hemoglobin A1C: 7.6 %

## 2023-12-03 NOTE — Progress Notes (Signed)
 [facility] Department of Endocrinology Diabetes and Metabolism   [address], [address], [place], [place], [address]  Phone: [phone]  Fax: [phone]    Date of Service: 12/03/2023  Primary Care Physician: Sonny Dust, MD   Provider: Jeneen Mire, MD     Reason for the visit:  Dm type 1, Graves disease     History of Present Illness:  The history is provided by the patient. No language interpreter was used. Accuracy of the patient data is excellent.  Maria Valdez is a very pleasant 58 y.o. female seen today for diabetes management     Maria Valdez is a very pleasant 58 y.o. old female with PMH of HTN, hyperthyroidism, type I diabetes mellitus seen today for a follow-up visit.    The patient currently using Medtronic 780 G insulin  pump with the Guardian CGM.  Her current insulin  pump settings including basal rate 0.95, carb ratio 13, insulin  sensitivity 57, glucose target 100-150, active insulin  time 2 hours, smart guard target 120.  She did very well since started insulin  pump she was able to drop her A1c from 12.7 down to 7.6.  Lab Results   Component Value Date/Time    LABA1C 7.6 12/03/2023 12:14 PM    LABA1C 12.7 05/08/2023 03:15 AM     The patient reports microvascular diabetes complications.  She is due with diabetic eye exam.      PAST MEDICAL HISTORY   Past Medical History:   Diagnosis Date    Diabetes mellitus (HCC)     HIV infection (HCC)     HTN (hypertension) 08/19/2011    Thyroid disease     Tobacco use disorder affecting pregnancy, antepartum     Tobacco use disorder affecting pregnancy, antepartum        PAST SURGICAL HISTORY   Past Surgical History:   Procedure Laterality Date    CHOLECYSTECTOMY      THYROID SURGERY         SOCIAL HISTORY   Tobacco:   reports that she has been smoking cigarettes. She has never used smokeless tobacco.  Alcohol:   reports no history of alcohol use.  Drugs:   reports no history of drug use.    FAMILY HISTORY   No family  history on file.    ALLERGIES AND DRUG REACTIONS   Allergies   Allergen Reactions    Sulfa Antibiotics Hives and Swelling    Codeine Nausea Only       CURRENT MEDICATIONS   Current Outpatient Medications   Medication Sig Dispense Refill    DULoxetine  (CYMBALTA ) 20 MG extended release capsule Take 1 capsule by mouth daily 30 capsule 0    HYDROcodone -acetaminophen  (NORCO ) 5-325 MG per tablet Take 1 tablet by mouth every 6 hours as needed for Pain.      hydrOXYzine  HCl (ATARAX ) 25 MG tablet Take 1 tablet by mouth every 8 hours as needed for Itching 30 tablet 1    atorvastatin  (LIPITOR) 20 MG tablet Take 1 tablet by mouth daily 30 tablet 3    vitamin D  50 MCG (2000 UT) CAPS capsule Take 1 capsule by mouth daily 30 capsule 3    ferrous sulfate  (IRON 325) 325 (65 Fe) MG tablet Take 1 tablet by mouth daily (with breakfast) 30 tablet 3    naproxen  (NAPROSYN ) 500 MG tablet Take 1 tablet by mouth 2 times daily (with meals) 60 tablet 0    methIMAzole  (TAPAZOLE ) 10 MG tablet  1 tablets 5 days a week, none on Saturday and Sunday 90 tablet 3    Glucagon  (BAQSIMI  ONE PACK) 3 MG/DOSE POWD For severe hypoglycemia 1 each 1    insulin  lispro (HUMALOG ,ADMELOG ) 100 UNIT/ML SOLN injection vial Via insulin  pump max dose 40 units daily. 20 mL 5    propranolol  (INDERAL  LA) 120 MG extended release capsule Take 1 capsule by mouth daily      Elviteg-Cobic-Emtricit-TenofAF (GENVOYA) 150-150-200-10 MG TABS Take 1 tablet by mouth daily       No current facility-administered medications for this visit.       Review of Systems  Constitutional: No fever, no chills, no diaphoresis, no generalized weakness.  HEENT: No blurred vision, No sore throat, no ear pain, no hair loss  Neck: denied any neck swelling, difficulty swallowing,   Cardio-pulmonary: No CP, SOB or palpitation, No orthopnea or PND. No cough or wheezing.  GI: No N/V/D, no constipation, No abdominal pain, no melena or hematochezia   GU: Denied any dysuria, hematuria, flank pain, discharge,  or incontinence.   Skin: denied any rash, ulcer, Hirsute, or hyperpigmentation.   MSK: denied any joint deformity, joint pain/swelling, muscle pain, or back pain.  Neuro: no numbness, no tingling, no weakness, _    OBJECTIVE    BP (!) 143/88   Pulse 77   Temp 97.4 F (36.3 C) (Temporal)   Resp 18   Ht 1.575 m (5\' 2" )   Wt 59.4 kg (131 lb)   SpO2 99%   BMI 23.96 kg/m   BP Readings from Last 4 Encounters:   12/03/23 (!) 143/88   11/19/23 130/70   11/15/23 128/87   10/27/23 (!) 142/98     Wt Readings from Last 6 Encounters:   12/03/23 59.4 kg (131 lb)   11/19/23 59.3 kg (130 lb 12.8 oz)   11/14/23 59 kg (130 lb)   10/27/23 60.3 kg (133 lb)   09/28/23 57.6 kg (127 lb)   09/21/23 57.6 kg (127 lb)       Physical examination:  General: awake alert, oriented x3, no abnormal position or movements.  HEENT: normocephalic non-traumatic, no exophthalmos   Neck: supple, no LN enlargement, no thyromegaly, no thyroid tenderness, no JVD.  Pulm: Clear equal air entry no added sounds, no wheezing or rhonchi    CVS: S1 + S2, no murmur, no heave. Dorsalis pedis pulse palpable   Abd: soft lax, no tenderness, no organomegaly, audible bowel sounds.   Skin: warm, no lesions, no rash. No callus, no Ulcers, No acanthosis nigricans  Musculoskeletal: No back tenderness, no kyphosis/scoliosis    Neuro: CN intact, Monofilament sensation decreased bilateral , muscle power normal  Psych: normal mood, and affect      Review of Laboratory Data:  I personally reviewed the following lab:  Lab Results   Component Value Date/Time    WBC 6.8 11/19/2023 03:33 PM    RBC 4.46 11/19/2023 03:33 PM    HGB 13.7 11/19/2023 03:33 PM    HCT 40.8 11/19/2023 03:33 PM    MCV 91.5 11/19/2023 03:33 PM    MCH 30.7 11/19/2023 03:33 PM    MCHC 33.6 11/19/2023 03:33 PM    RDW 12.9 11/19/2023 03:33 PM    PLT 321 11/19/2023 03:33 PM    MPV 10.7 11/19/2023 03:33 PM      Lab Results   Component Value Date/Time    NA 140 11/19/2023 03:33 PM    K 3.8 11/19/2023 03:33 PM  CO2 24 11/19/2023 03:33 PM    BUN 11 11/19/2023 03:33 PM    CREATININE 0.5 11/19/2023 03:33 PM    CALCIUM  9.5 11/19/2023 03:33 PM    LABGLOM >90 11/19/2023 03:33 PM    GFRAA >60 09/09/2018 08:42 PM      Lab Results   Component Value Date/Time    TSH 2.57 09/10/2023 12:00 AM    T4FREE 1.3 09/10/2023 12:00 AM    T3TOTAL 88 05/08/2023 03:15 AM     Lab Results   Component Value Date/Time    LABA1C 7.6 12/03/2023 12:14 PM    GLUCOSE 224 11/19/2023 03:33 PM    GLUCOSE 90 09/24/2010 11:30 AM     Lab Results   Component Value Date/Time    LABA1C 7.6 12/03/2023 12:14 PM    LABA1C 12.7 05/08/2023 03:15 AM     Lab Results   Component Value Date/Time    TRIG 91 11/19/2023 03:33 PM    HDL 73 11/19/2023 03:33 PM    CHOL 185 11/19/2023 03:33 PM     Lab Results   Component Value Date/Time    VITD25 39.3 11/19/2023 03:33 PM    VITD25 11 09/24/2010 11:30 AM       ASSESSMENT & RECOMMENDATIONS   Maria Valdez, a 58 y.o.-old female seen in for the following issues       Assessment:      Diagnosis Orders   1. Type 1 diabetes mellitus with hyperglycemia (HCC)  POCT glycosylated hemoglobin (Hb A1C)    Albumin/Creatinine Ratio, Urine    Comprehensive Metabolic Panel    Hemoglobin A1C    GLUCOSE MONITOR, 72 HOUR, PHYS INTERP      2. Graves disease  TSH    T4, Free      3. Hyperthyroidism  TSH    T4, Free      4. Uses self-applied continuous glucose monitoring device  GLUCOSE MONITOR, 72 HOUR, PHYS INTERP      5. 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  Currently using Medtronic 780 G insulin  pump with the Guardian CGM.  Will continue the current insulin  pump settings. basal rate 0.95, carb ratio 13, insulin  sensitivity 57, glucose target 100-150, active insulin  time 2 hours, smart guard target 120.  Most recent A1c 9.6% down from 12.7%  Continue using the CGM.  Will obtain lab before next  appointment   2. Graves disease   At goal, we will continue methimazole   10 mg 1 tablet 5 days a week and then on weekends  Pt s/p partial thyroidectomy long time ago (~ 30 years ago)   Obtain TFTs in 10 to 12 weeks  Discussed the side effects of methimazole  in details with the patient     3. Chronic fatigue   Feels better now.  Previous blood work showed a normal cortisol level   4. Vitamin D  deficiency   On vitamin d  supplementation     I personally reviewed external notes from PCP and other patient's care team providers, and personally interpreted labs associated with the above diagnosis. I also ordered labs to further assess and manage the above addressed medical conditions    Return in about 4 months (around 04/04/2024) for DM type 1, Hyperthyroidism.    The above issues were reviewed with the patient  who understood and agreed with the plan.    Thank you for allowing us  to participate in the care of this patient. Please do not hesitate to contact us  with any additional questions.     Quantel Mcinturff Minerva Alvine, MD     [facility]   [address], [place], [place], [address]  Phone: [phone]  Fax: [phone]  ------------------------------  An electronic signature was used to authenticate this note. Jeneen Mire, MD on 12/03/2023 at 12:54 PM

## 2023-12-12 ENCOUNTER — Telehealth

## 2023-12-12 NOTE — Telephone Encounter (Signed)
 Patient has her mri on Monday @ 630 , due to the amount of pain she is having, she is afraid she will not be able to stay still, can you send something in for her to take prior to [facility].

## 2023-12-13 ENCOUNTER — Encounter

## 2023-12-15 ENCOUNTER — Inpatient Hospital Stay: Admit: 2023-12-15 | Payer: PRIVATE HEALTH INSURANCE

## 2023-12-15 ENCOUNTER — Encounter

## 2023-12-15 DIAGNOSIS — M9979 Connective tissue and disc stenosis of intervertebral foramina of abdomen and other regions: Secondary | ICD-10-CM

## 2023-12-15 MED ORDER — GABAPENTIN 100 MG PO CAPS
100 | ORAL_CAPSULE | Freq: Three times a day (TID) | ORAL | 3 refills | 30.00 days | Status: DC
Start: 2023-12-15 — End: 2023-12-15

## 2023-12-15 MED ORDER — LORAZEPAM 1 MG PO TABS
1 | ORAL_TABLET | Freq: Once | ORAL | 0 refills | 30.00 days | Status: AC
Start: 2023-12-15 — End: 2023-12-15

## 2023-12-15 MED ORDER — GABAPENTIN 100 MG PO CAPS
100 | ORAL_CAPSULE | Freq: Three times a day (TID) | ORAL | 3 refills | 30.00000 days | Status: DC
Start: 2023-12-15 — End: 2023-12-31

## 2023-12-15 MED ORDER — GADOTERIDOL 279.3 MG/ML IV SOLN
279.3 | Freq: Once | INTRAVENOUS | Status: AC | PRN
Start: 2023-12-15 — End: 2023-12-15
  Administered 2023-12-16: 12 mL via INTRAVENOUS

## 2023-12-15 NOTE — Telephone Encounter (Signed)
Re-sent to different pharmacy per patient request.

## 2023-12-15 NOTE — Telephone Encounter (Signed)
*  Patient is requesting refill of gabapentin . Patient states this medication works best for her pain. She states she ran out and took some 300mg  capsules once a day that she had on hand.*    Name of Medication(s) Requested:  Requested Prescriptions     Pending Prescriptions Disp Refills    gabapentin  (NEURONTIN ) 100 MG capsule 60 capsule 0     Sig: Take 1 capsule by mouth 2 times daily for 30 days. Intended supply: 30 days       Medication is on current medication list Yes    Dosage and directions were verified? Yes    Quantity verified: 30 day supply     Pharmacy Verified?  Yes    Last Appointment:  11/19/2023    Future appts:  Future Appointments   Date Time Provider Department Center   12/15/2023  7:15 PM [facility] MRI-B [facility] MRI [facility] Radiolog   01/21/2024  3:40 PM Sonny Dust, MD [facility] DEP   04/06/2024  2:30 PM Abusag, Joen Muskrat, MD [facility] ENDO [facility]        (If no appt send self scheduling link. Aaron [email or web address])  Scheduling request sent?     []  Yes  [x]  No    Does patient need updated?  [x]  Yes  []  No

## 2023-12-15 NOTE — Telephone Encounter (Signed)
 Patient called radiology regarding this to see what they would do for her to "lay still" due to pain. Radiology stated they would not be able to give her any one time medications and told this patient to call PCP back for a 1-2 time dose of medication to be called into her pharmacy. Please advise.

## 2023-12-15 NOTE — Addendum Note (Signed)
 Addended by: Everlena Hoard on: 12/15/2023 12:00 PM     Modules accepted: Orders

## 2023-12-15 NOTE — Telephone Encounter (Signed)
 Patient infomed of results/recommendations; patient verbalized understanding.

## 2023-12-16 NOTE — Other (Signed)
 Please let patient know that the MRI of her back shows a large amount of arthritis and a lobulated right paracentral disc herniation AKA a bulging disc.  Based on the extensiveness of her narrowing and arthritis I would like for her to see pain management and continue physical therapy though if these do not prove useful would have her see orthospine, please let me know what she thinks about seeing either orthopedic spine doctors or potentially neurosurgeons.

## 2023-12-17 ENCOUNTER — Encounter

## 2023-12-17 NOTE — Progress Notes (Signed)
Order for PT

## 2023-12-25 ENCOUNTER — Encounter
Admit: 2023-12-25 | Discharge: 2023-12-25 | Payer: PRIVATE HEALTH INSURANCE | Attending: Rehabilitative and Restorative Service Providers"

## 2023-12-25 DIAGNOSIS — M5126 Other intervertebral disc displacement, lumbar region: Secondary | ICD-10-CM

## 2023-12-25 NOTE — Progress Notes (Signed)
 [facility]   Phone: [phone]   Fax: [phone]           Date:  12/25/2023   Patient: Maria Valdez  DOB: 07-06-1966  MRN: [id number]  Referring Provider: Sonny Dust, MD  [address]  [place],  [place] [address]     Medical Diagnosis:     M51.26 (ICD-10-CM) - Lumbar disc herniation     SUBJECTIVE:     Onset date: end of February       Mechanism of Injury: pt reports pain came out of nowhere.  Initially they said pt had uti, reports with steroid pack had no pain.  Pain came right back after ending steroid pack.  Reports got a PCP, got MRI.  Reports 'a mess' after work.  Struggling to make dinner.  Reports never called pain management until recently.  Did make an appt for next week.      Medical Management for Current Problem:  Cymbalta ,  gabapentin ,- reports not taking Gabapentin  as prescribed. Advised to contact physician concerning.      Chief complaint: pain    Behavior: condition is getting worse slowly.  Reports started in low back.      Pain: pt reports pain but doesn't  quantify. Pt visibly uncomfortable.  Moving in chair throughout session.      Location:: Back:    reports hurts to touch skin, low back and down R LE mostly . Reports numbness and itchiness upper back.         Provoking Activities/Positions: sometimes sitting too much, standing                  Relieving Activitie/Positions: laying in bed , bath    Disturbed Sleep: 'not bad' - reports wakes up restless   Bladder Dysfunction: no  Bowel Dysfunction: no     Imaging results: MRI LUMBAR SPINE W WO CONTRAST  Result Date: 12/15/2023  EXAMINATION: MRI OF THE LUMBAR SPINE WITHOUT AND WITH CONTRAST  12/15/2023 7:51 pm TECHNIQUE: Multiplanar multisequence MRI of the lumbar spine was performed without and with the administration of intravenous contrast. COMPARISON: 09/21/2023 HISTORY: ORDERING SYSTEM PROVIDED HISTORY: Foraminal stenosis due to intervertebral disc disease TECHNOLOGIST PROVIDED HISTORY: STAT Creatinine  as needed:->Yes Reason for exam:->low back pain, chronic >1 year, history of HIV, parasthesias, foraminal stenosis What is the sedation requirement?->Sedation FINDINGS: Assuming 5 lumbar type vertebral bodies, the conus terminates at the L1 level.  Lumbar vertebral body heights are preserved.  Degenerative disc disease with disc desiccation.  Mild endplate degenerative changes.  Facet arthropathy.  Mild right convex lumbar scoliosis.  Osseous marrow signal is overall within normal limits.  Degenerative changes of the sacroiliac joints. No abnormal enhancement following contrast in lung the conus or intrathecal structures. T11-12: No significant narrowing. T12-L1: No significant narrowing. L1-L2: Disc bulge with facet arthropathy and thickening ligamentum flavum. Mild indentation of the anterior and the posterior aspects of the thecal sac. L2-L3: Disc bulge with facet arthropathy and thickening ligamentum flavum. Mild central canal narrowing, slightly greater towards the left side.  Mild bilateral neural foraminal narrowing. L3-L4: Disc bulge with facet arthropathy and thickening ligamentum flavum. Mild central canal narrowing.  Mild bilateral neural foraminal narrowing. L4-L5: Disc bulge with facet arthropathy and thickening ligamentum flavum. Mild-to-moderate central canal narrowing.  Mild to moderate bilateral neural foraminal narrowing. L5-S1: Disc bulge with facet arthropathy and thickening ligamentum flavum. Lobulated right paracentral disc herniation measuring 5 mm in AP thickness. There is narrowing of the anterior  aspect of the central canal with asymmetric narrowing at the right subarticular region.  Mild indentation of the anterior aspect of the thecal sac.  Severe right neural foraminal narrowing.  There is a right foraminal disc herniation.  Mild to moderate left neural foraminal narrowing.     1. Degenerative disc disease and facet arthropathy. 2. At L5-S1, lobulated right paracentral disc herniation.   Narrowing of the anterior aspect of the central canal with asymmetric narrowing of the right subarticular region.  Mild indentation of the anterior aspect of the thecal sac.  Severe right neural foraminal narrowing with right foraminal disc herniation.  Mild to moderate left neural foraminal narrowing. 3. Other narrowing as above.       Past Medical History:  Past Medical History:   Diagnosis Date    Diabetes mellitus (HCC)     HIV infection (HCC)     HTN (hypertension) 08/19/2011    Thyroid disease     Tobacco use disorder affecting pregnancy, antepartum     Tobacco use disorder affecting pregnancy, antepartum      Past Surgical History:   Procedure Laterality Date    CHOLECYSTECTOMY      THYROID SURGERY         Medications:   Current Outpatient Medications   Medication Sig Dispense Refill    gabapentin  (NEURONTIN ) 100 MG capsule Take 3 capsules by mouth 3 times daily for 80 days. 180 capsule 3    DULoxetine  (CYMBALTA ) 20 MG extended release capsule Take 1 capsule by mouth daily 30 capsule 0    HYDROcodone -acetaminophen  (NORCO ) 5-325 MG per tablet Take 1 tablet by mouth every 6 hours as needed for Pain.      hydrOXYzine  HCl (ATARAX ) 25 MG tablet Take 1 tablet by mouth every 8 hours as needed for Itching 30 tablet 1    atorvastatin  (LIPITOR) 20 MG tablet Take 1 tablet by mouth daily 30 tablet 3    vitamin D  50 MCG (2000 UT) CAPS capsule Take 1 capsule by mouth daily 30 capsule 3    ferrous sulfate  (IRON 325) 325 (65 Fe) MG tablet Take 1 tablet by mouth daily (with breakfast) 30 tablet 3    naproxen  (NAPROSYN ) 500 MG tablet Take 1 tablet by mouth 2 times daily (with meals) 60 tablet 0    methIMAzole  (TAPAZOLE ) 10 MG tablet 1 tablets 5 days a week, none on Saturday and Sunday 90 tablet 3    Glucagon  (BAQSIMI  ONE PACK) 3 MG/DOSE POWD For severe hypoglycemia 1 each 1    insulin  lispro (HUMALOG ,ADMELOG ) 100 UNIT/ML SOLN injection vial Via insulin  pump max dose 40 units daily. 20 mL 5    propranolol  (INDERAL  LA) 120 MG  extended release capsule Take 1 capsule by mouth daily      Elviteg-Cobic-Emtricit-TenofAF (GENVOYA) 150-150-200-10 MG TABS Take 1 tablet by mouth daily       No current facility-administered medications for this visit.       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      Patient Goals: pain relief    Contraindications/Precautions: HIV    OBJECTIVE:     Observations: well nourished female      Gait: decreased foot clearance bilateral    Functional Strength: NT -  toe walk, heel walk, and squat.    Range of Motion:    Trunk:    Flexion:  []  Normal   [x]  Limited 20% reports hurts coming back up  Extension:  []  Normal   [x]  Limited 30% reports painful     Right Rotation: []  Normal   [x]  Limited 10%   Left Rotation:  []  Normal   [x]  Limited 10%   Right Side Bending: []  Normal   [x]  Limited 10%   Left Side Bending: []  Normal   [x]  Limited 10%    Lower Extremity:   Right:   [x]  Normal   []  Limited    Left:   [x]  Normal   []  Limited       Strength:     Trunk: decreased ROM, decreased strength   R LE: 5-/5   L LE: 5-/5    Palpation: Tender to palpation lumbar and area of B SI, Non-tender to palpation sacrum     Sensation: reports gets shooting pain down leg but not numbness/tingling.  Reports feels numbness tingling in upper back.      Special Tests:   []  Nerve Root Compression           Right [] + / []  -    Left [] + / []  -  []  Slump           Right [] + / []  -    Left [] + / []  -  []  FADIR          Right [] + / []  -    Left [] + / []  -  []  S-I Distraction          Right [] + / []  -    Left [] + / []  -     []  SLR           Right [] + / [x]  -    Left [] + / [x]  -     []  FABER          Right [x] + / []  -    Left [] + / [x]  -  []  S-I Compression          Right [] + / []  -    Left [] + / []  -   []  Other: [] + / []  -           ASSESSMENT     Outcome Measure:   Modified Oswestry 60 % disability    Problems:   Pain reported   ROM decreased   Strength decreased  Decreased functional  ability with sitting too much, standing     [x]  There are no barriers affecting plan of care or recovery    []  Barriers to this patient's plan of care or recovery include.    Domestic Concerns:  [x]  No  []  Yes:        Long Term goals (5 weeks )  Decrease reported pain to 0-5 /10  Increase ROM by 10%   Increase Strength to 5/5    Able to perform/complete the following functions/tasks: pt able to sit 1 hour + with no to min pain/limitation.  Pt able to stand to cook dinner for 30 + minutes with no to min pain/limitation.  Pt able to perform work duties 4 + hours with no to min pain/limitation.    Oswestry Low Back Disability Questionnaire 30% disability   Independent with Home Exercise Programs    Rehab Potential: [x]  Good  []  Fair  []  Poor    PLAN       Treatment Plan:   [x]  Therapeutic Exercise  [x]  Therapeutic Activity  [x]  Neuromuscular Re-education   [x]  Gait Training  [x]  Balance Training  [x]  Aerobic conditioning  [  x] Manual Therapy  [x]  Massage/Fascial release   []  Work/Sport specific activities    []  Pain Neuroscience [x]  Cold/hotpack  []  Vasocompression  [x]  Electrical Stimulation  []  Lumbar/Cervical Traction  []  Ultrasound   []  Iontophoresis: 4 mg/mL Dexamethasone  Sodium Phosphate  40-80 mAmin        [x]  Instruction in HEP      []   Medication allergies reviewed for use of Dexamethasone  Sodium Phosphate  4mg /ml  with iontophoresis treatments.  Patient is not allergic.      The following CPT codes are likely to be used in the care of this patient: 97161 PT Evaluation: Low Complexity   97164 PT Re-Evaluation   97110 Therapeutic Exercise   97112 Neuromuscular Re-Education   97530 Therapeutic Activities   97140 Manual Therapy   97116 Gait Training   907 [id number] 3065 Electrical Stimulation      Suggested Professional Referral: [x]  No  []  Yes:     Patient Education:  [x]  Plans/Goals, Risks/Benefits discussed  [x]  Home exercise program  Method of Education: [x]  Verbal  [x]  Demo  [x]  Written  Comprehension of Education:  [x]   Verbalizes understanding.  [x]  Demonstrates understanding.  []  Needs Review.  []  Demonstrates/verbalizes understanding of HEP/Ed previously given.    Frequency:  2 days per week for 5 weeks    Patient understands diagnosis/prognosis and consents to treatment, plan and goals: [x]  Yes    []  No     Thank you for the opportunity to work with your patient.  If you have questions or comments, please contact me at numbers listed above.    Electronically signed by: Kathrynn Park, PT DPT [phone]          Please sign Physician's Certification and return to:  [facility]  [address]  [place] [address]  Dept: [phone]  Dept Fax: [phone]  Loc: [phone] PT DPT [id number]    Physician's Certification / Comments     Frequency/Duration 2 days per week for 5 weeks.   Certification period from 12/25/2023  to 01/30/2024 .    I have reviewed the Plan of Care established for skilled therapy services and certify that the services are required and that they will be provided while the patient is under my care.    Physician's Comments/Revisions:               Physician's Printed Name:                                           Physician's Signature:                                                               Date:

## 2023-12-25 NOTE — Progress Notes (Addendum)
 [facility]   Phone: [phone]   Fax: [phone]      Physical Therapy Treatment Note    Date: 12/25/2023  Patient Name: MARKA TRELOAR  DOB: 1965-09-28   MRN: [id number]  DOInjury: end of February    DOSx: NA  Referring Provider: Sonny Dust, MD  [address]  [place],  [place] [address]     Medical Diagnosis: M51.26 (ICD-10-CM) - Lumbar disc herniation     Precautions:  HIV    Outcome Measure:  Oswestry 60%     S: see eval  O:  Time 1500-1537      Visit 1 Repeat outcome measure at mid point and end.    Pain Pain reported but no number provided.      ROM      Modalities      MH + ES            Exercise      ALL EXERCISE DONE WITH DRAW-IN TECHNIQUE                            Functional activities To aid in reaching , pushing, pulling tasks at home     ROWS: H      ROWS: M      ROWS: L      Obliques - high      Obliques - low       THEREX     Bike      Punches      Lat pulldowns      Triceps ext standing      Marching            Trunk ext TB      Trunk flex TB      Hip abd      Hip EXT      TG Squats                  A:  Tolerated fairly well.    P: Continue with rehab plan  Kathrynn Park, PT  PT DPT [phone]    Treatment Charges: Mins Units   Initial Evaluation 37 1   Re-Evaluation     Ther Exercise         TE     Manual Therapy     MT     Ther Activities        TA     Gait Training          GT     Neuro Re-education NR     Modalities     Non-Billable Service Time     Other     Total Time/Units 37 1

## 2023-12-31 ENCOUNTER — Encounter

## 2023-12-31 ENCOUNTER — Ambulatory Visit: Admit: 2023-12-31 | Discharge: 2023-12-31 | Payer: PRIVATE HEALTH INSURANCE | Attending: Anesthesiology

## 2023-12-31 VITALS — BP 148/88 | HR 84 | Temp 97.50000°F | Resp 18 | Ht 62.0 in | Wt 130.0 lb

## 2023-12-31 DIAGNOSIS — M51369 Other intervertebral disc degeneration, lumbar region without mention of lumbar back pain or lower extremity pain: Secondary | ICD-10-CM

## 2023-12-31 MED ORDER — HYDROCODONE-ACETAMINOPHEN 5-325 MG PO TABS
5-325 | ORAL_TABLET | Freq: Two times a day (BID) | ORAL | 0 refills | Status: DC | PRN
Start: 2023-12-31 — End: 2024-01-01

## 2023-12-31 MED ORDER — GABAPENTIN 300 MG PO CAPS
300 | ORAL_CAPSULE | Freq: Three times a day (TID) | ORAL | 2 refills | 30.00000 days | Status: DC
Start: 2023-12-31 — End: 2024-03-22

## 2023-12-31 NOTE — H&P (View-Only) (Signed)
 [facility]      Dept: [phone]          Consult Note      Patient:  Maria Valdez, DOB 1965/10/21    Date of Service:  12/31/23     Requesting Physician:  Sonny Dust, MD    Reason for Consult:      Patient presents with complaints of low back pain    HISTORY OF PRESENT ILLNESS:      Maria Valdez is a 58 y.o. female presented today to [facility] for evaluation of  low back pan since Feb 2025.    Low back pain and right LE pain.    Pain is constant and is described as aching, sharp, and stabbing.     Pain does radiate to right leg. She  has numbness of the right leg.    Patient does not have new bladder or bowel dysfunction.    Alleviating factors include: rest.  Aggravating factors include: movement, bending, lifting.     Pain causes functional limitations/ limits Adl's : Yes    Nursing notes and details of the pain history reviewed. Please see intake notes for details.    On gabapentin  100 mg (5 tabs/ day).  Has tried a low-dose of Cymbalta  did not notice much benefit and she has discontinued.    Previous treatments:   Physical Therapy : PAIN IS SEVERE AND CANNOT DO PT DUE TO SEVERE PAIN     Medications: - NSAID's : yes             - Membrane stabilizers : yes - gabapentin             - Opioids : no            - Adjuvants or Others : yes    Spine Surgeries: no    She has not been on anticoagulation medications     She is diabetic.    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 Degenerative disc disease and facet arthropathy.  2. At L5-S1, lobulated right paracentral disc herniation.  Narrowing of the  anterior aspect of the central canal with asymmetric narrowing of the right  subarticular region.  Mild indentation of the anterior aspect of the thecal  sac.  Severe right neural foraminal narrowing with right foraminal  disc  herniation.  Mild to moderate left neural foraminal narrowing.  3. Other narrowing as above.    Xray hips: 09/21/2023:  FINDINGS:  AP pelvis with AP frogleg views right hip demonstrate normal alignment  without evidence of fracture or subluxation.  There are mild osteoarthritic  changes identified of the hips.  The SI joints appear unremarkable.     IMPRESSION:  Mild osteoarthritic changes of the hips.    CT Lumbar: 09/2023:  IMPRESSION:  1. No acute osseous abnormality involving the lumbar spine.  2. Moderate right neural foraminal narrowing at L5/S1.  3. No significant central canal stenosis.  MRI may be warranted for further  evaluation.  4. 2 mm nonobstructing left renal calculus.    Past Medical History:   Diagnosis Date    Diabetes mellitus (HCC)     HIV infection (HCC)     HTN (hypertension) 08/19/2011    Thyroid disease  Tobacco use disorder affecting pregnancy, antepartum     Tobacco use disorder affecting pregnancy, antepartum        Past Surgical History:   Procedure Laterality Date    CHOLECYSTECTOMY      THYROID SURGERY         Prior to Admission medications    Medication Sig Start Date End Date Taking? Authorizing Provider   Cranberry 500 MG CAPS Take 2 capsules by mouth daily   Yes [provider]   vitamin B-12 (CYANOCOBALAMIN) 1000 MCG tablet Take 1 tablet by mouth daily   Yes [provider]   gabapentin  (NEURONTIN ) 100 MG capsule Take 3 capsules by mouth 3 times daily for 80 days. 12/15/23 03/04/24 Yes Sonny Dust, MD   DULoxetine  (CYMBALTA ) 20 MG extended release capsule Take 1 capsule by mouth daily 11/22/23  Yes Shafiq, Nimra, MD   hydrOXYzine  HCl (ATARAX ) 25 MG tablet Take 1 tablet by mouth every 8 hours as needed for Itching 11/19/23  Yes Sonny Dust, MD   vitamin D  50 MCG (2000 UT) CAPS capsule Take 1 capsule by mouth daily 11/19/23  Yes Sonny Dust, MD   ferrous sulfate  (IRON 325) 325 (65 Fe) MG tablet Take 1 tablet by mouth daily (with breakfast) 11/19/23  Yes Sonny Dust, MD   methIMAzole  (TAPAZOLE ) 10 MG tablet 1 tablets 5 days a week, none on Saturday and Sunday 09/16/23  Yes Abusag, Joen Muskrat, MD   Glucagon  (BAQSIMI  ONE PACK) 3 MG/DOSE POWD For severe hypoglycemia 08/21/23  Yes Ezella Holly, APRN - CNS   insulin  lispro (HUMALOG ,ADMELOG ) 100 UNIT/ML SOLN injection vial Via insulin  pump max dose 40 units daily. 06/17/23  Yes Ezella Holly, APRN - CNS   propranolol  (INDERAL  LA) 120 MG extended release capsule Take 1 capsule by mouth daily   Yes [provider]   Elviteg-Cobic-Emtricit-TenofAF (GENVOYA) 150-150-200-10 MG TABS Take 1 tablet by mouth daily   Yes [provider]   atorvastatin  (LIPITOR) 20 MG tablet Take 1 tablet by mouth daily  Patient not taking: Reported on 12/31/2023 11/19/23   Sonny Dust, MD       Allergies   Allergen Reactions    Sulfa Antibiotics Hives and Swelling    Codeine Nausea Only       Social History     Socioeconomic History    Marital status: Divorced     Spouse name: Not on file    Number of children: Not on file    Years of education: Not on file    Highest education level: Not on file   Occupational History    Not on file   Tobacco Use    Smoking status: Every Day     Current packs/day: 0.50     Types: Cigarettes    Smokeless tobacco: Never   Substance and Sexual Activity    Alcohol use: No    Drug use: No    Sexual activity: Yes     Partners: Male   Other Topics Concern    Not on file   Social History Narrative    Not on file     Social Drivers of Health     Financial Resource Strain: Not on file   Food Insecurity: No Food Insecurity (05/07/2023)    Hunger Vital Sign     Worried About Running Out of Food in the Last Year: Never true     Ran Out of Food in the Last Year: Never true  Transportation Needs: Unmet Transportation Needs (05/07/2023)    PRAPARE - Therapist, art (Medical): Yes     Lack of Transportation (Non-Medical): Yes   Physical Activity: Not on file   Stress: Not on file   Social  Connections: Not on file   Intimate Partner Violence: Not on file   Housing Stability: High Risk (05/07/2023)    Housing Stability Vital Sign     Unable to Pay for Housing in the Last Year: Yes     Number of Times Moved in the Last Year: 0     Homeless in the Last Year: No       Family History   Problem Relation Age of Onset    Coronary Art Dis Mother     Alcohol Abuse Father        REVIEW OF SYSTEMS:     Patient specifically denies fever/chills, chest pain, shortness of breath, new bowel or bladder complaints.  All other review of systems was negative.  Review of Systems - documented reviewed.    PHYSICAL EXAMINATION:      BP (!) 148/88   Pulse 84   Temp 97.5 F (36.4 C) (Infrared)   Resp 18   Ht 1.575 m (5' 2)   Wt 59 kg (130 lb)   SpO2 98%   BMI 23.78 kg/m     General:      General appearance:  Pleasant and well-hydrated, in no distress and A & O x 3  Build:Normal Weight  Function: Rises from seated position easily and Moves about room without difficulty    HEENT:    Head:normocephalic, atraumatic  Pupils:regular, round, equal  Sclera: icterus absent    Lungs:    Breathing:normal breathing pattern     CVS:     RRR    Abdomen:    Shape:non-distended and normal  Tenderness:none  Guarding:none    Cervical spine:    Inspection:normal  Palpation:tenderness paravertebral muscles, tenderness trapezium, left, right and positive.  Range of motion:Normal flexion, extension, rotation bilaterally and is not painful.  Spurling's: negative bilaterally    Thoracic spine:     Spine inspection:normal   Palpation:No tenderness over the midline and paraspinal area, bilaterally  Range of motion:normal in flexion, extension rotation bilateral and is not painful.    Lumbar spine:    Spine inspection: Normal   Palpation: Tenderness paravertebral muscles Yes bilaterally  Range of motion: Decreased, flexion Decreased, Lateral bending, extension and rotation bilaterally reduced is painful.  Sacroiliac joint tenderness No  bilaterally  FABER test: negative bilaterally  Gaenslen's test:negative bilaterally   Piriformis tenderness: negative bilaterally  SLR : negative bilaterally    Musculoskeletal:    Trigger points - no    Extremities:    Tremors:None bilaterally upper and lower  Edema:no    Neurological:    Sensory: Normal to light touch     Motor:   Right Grip 5/5              Left Grip 5/5               Right Bicep 5/5           Left Bicep 5/5              Right Triceps 5/5       Left Triceps 5/5          Right Deltoid 5/5     Left Deltoid 5/5  Right Quadriceps 5/5          Left Quadriceps 5/5           Right Gastrocnemius 5/5    Left Gastrocnemius 5/5  Right Ant Tibialis 5/5  Left Ant Tibialis 5/5    Dermatology:    Skin:no rashes or lesions noted    Assessment/Plan:     Diagnosis Orders   1. Degeneration of intervertebral disc of lumbar region, unspecified whether pain present        2. Lumbar radicular pain        3. Lumbosacral spondylosis without myelopathy        4. Smoking history        5. Other specified diabetes mellitus with other specified complication, with long-term current use of insulin  [facility])          58 y.o. female with h/o low back pain and right lower extremity pain since February 2025.    Acute onset pain.  Has been treated conservatively.  Pain causes significant functional imitations.    She may have difficulty in doing therapy because of pain.    MRI of the lumbar spine reviewed personally discussed the findings with the patient.  L5-S1 Severe right neural foraminal narrowing with right foraminal disc  Herniation.  CT lumbar spine and x-ray of the hip reviewed.  History of smoking+  H/o DM +    Has tried gabapentin  low-dose and has tried low-dose Cymbalta .  Currently discontinued Cymbalta .  He is taking gabapentin  500 mg/day.    Plan  Increase the dose of gabapentin  to 300 mg 3 times daily can increase to 4 times a day if tolerated well.  Instructions and side effects reviewed.    Will plan to  do right lumbar Transforaminal ESI at L5 and S1 under fluoroscopic guidance.  RBA discussed patient agreed proceed.  Has history of diabetes-discussed effect of interventions on blood sugar level and to keep a close watch after the procedure.  Will add around on urgent basis considering that she is in significant pain causing significant functional limitations.    Physical therapy once pain controlled.  Will give short course on Norco  for prn use # 14-use only for severe pain. RBA of opioids reviewed.  She is not too keen on chronic opioids and I agree.    Discussed importance of smoking cessation spinal and general health.    She will follow-up after the interventions.    Counseling : Patient encouraged to stay active and to continue Regular home exercise program as tolerated - stretching / strengthening.    Smoking cessation counseling : yes     Treatment plan discussed with the patient including medication and procedure side effects.    Controlled Substances Monitoring: [place]ARRS reviewed.    Dagmar Drones, MD    CC:    Sonny Dust, MD  [address]  [place],  [place] [address]     NOTE: The above documentation was prepared using voice recognition software.  Every attempt was made to ensure accuracy but there may be spelling, grammatical, and contextual errors.

## 2023-12-31 NOTE — Patient Instructions (Signed)
 Pain Management Department      Epidural Spinal Steroid Injection:  Epidural steroid injections can be an effective treatment for nerve root pain. Pinched or irritated nerves that exit the spine can cause shooting pain, burning, tingling, numbness, and muscle weakness. This is often caused by a bulging disc, herniated disc (slipped disc), trauma or aging of the spine. Epidural injections can help by placing a steroid medication which is an anti- inflammatory medicine around the irritated nerves to reduce inflammation.    What are the different types of epidural spinal injections;  Caudal epidural steroid injections, if symptoms are in the low back and lower extremities. Mostly done for patients who had low back surgery.  Lumbar lnterlaminar epidural injections, if symptoms are in the lower back and lower extremities.  Thoracic lnterlaminar epidural injections, if symptoms are in the mid back and rib cage.  Cervical lnterlaminar epidural injections, if symptoms are in the neck and upper extremities.    Pre-procedure Instructions:  Your current medications will be reviewed and you will be instructed on which medications to discontinue before the procedure.  lf sedation is administered you will be required to fast before the procedure (eight hours) 3- A driver will be required if sedation is administered.    Procedure:  A local anesthetic will be used to numb your skin. A needle will be advanced under X-ray to the target area. Placement will be confirmed by dye injection after which medicine will be placed, then the needle will be removed and Band-Aid will be applied.    Post-procedure:  You will be monitored in the recovery room for up to 30 minutes after the injection, when you are ready to leave, the staff will give you the discharge instructions. The extent and duration of pain relief may depend on the amount of disc or nerve root irritation. Other coexisting factors may contribute to your pain. Sometimes an  injection brings several weeks to months of pain relief  and then further treatment is need.

## 2023-12-31 NOTE — Progress Notes (Signed)
 [facility]      Dept: [phone]          Consult Note      Patient:  Maria Valdez, DOB 1965/10/21    Date of Service:  12/31/23     Requesting Physician:  Sonny Dust, MD    Reason for Consult:      Patient presents with complaints of low back pain    HISTORY OF PRESENT ILLNESS:      [name] is a 58 y.o. female presented today to [facility] for evaluation of  low back pan since Feb 2025.    Low back pain and right LE pain.    Pain is constant and is described as aching, sharp, and stabbing.     Pain does radiate to right leg. She  has numbness of the right leg.    Patient does not have new bladder or bowel dysfunction.    Alleviating factors include: rest.  Aggravating factors include: movement, bending, lifting.     Pain causes functional limitations/ limits Adl's : Yes    Nursing notes and details of the pain history reviewed. Please see intake notes for details.    On gabapentin  100 mg (5 tabs/ day).  Has tried a low-dose of Cymbalta  did not notice much benefit and she has discontinued.    Previous treatments:   Physical Therapy : PAIN IS SEVERE AND CANNOT DO PT DUE TO SEVERE PAIN     Medications: - NSAID's : yes             - Membrane stabilizers : yes - gabapentin             - Opioids : no            - Adjuvants or Others : yes    Spine Surgeries: no    She has not been on anticoagulation medications     She is diabetic.    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 Degenerative disc disease and facet arthropathy.  2. At L5-S1, lobulated right paracentral disc herniation.  Narrowing of the  anterior aspect of the central canal with asymmetric narrowing of the right  subarticular region.  Mild indentation of the anterior aspect of the thecal  sac.  Severe right neural foraminal narrowing with right foraminal  disc  herniation.  Mild to moderate left neural foraminal narrowing.  3. Other narrowing as above.    Xray hips: 09/21/2023:  FINDINGS:  AP pelvis with AP frogleg views right hip demonstrate normal alignment  without evidence of fracture or subluxation.  There are mild osteoarthritic  changes identified of the hips.  The SI joints appear unremarkable.     IMPRESSION:  Mild osteoarthritic changes of the hips.    CT Lumbar: 09/2023:  IMPRESSION:  1. No acute osseous abnormality involving the lumbar spine.  2. Moderate right neural foraminal narrowing at L5/S1.  3. No significant central canal stenosis.  MRI may be warranted for further  evaluation.  4. 2 mm nonobstructing left renal calculus.    Past Medical History:   Diagnosis Date    Diabetes mellitus (HCC)     HIV infection (HCC)     HTN (hypertension) 08/19/2011    Thyroid disease  Tobacco use disorder affecting pregnancy, antepartum     Tobacco use disorder affecting pregnancy, antepartum        Past Surgical History:   Procedure Laterality Date    CHOLECYSTECTOMY      THYROID SURGERY         Prior to Admission medications    Medication Sig Start Date End Date Taking? Authorizing Provider   Cranberry 500 MG CAPS Take 2 capsules by mouth daily   Yes [provider]   vitamin B-12 (CYANOCOBALAMIN) 1000 MCG tablet Take 1 tablet by mouth daily   Yes [provider]   gabapentin  (NEURONTIN ) 100 MG capsule Take 3 capsules by mouth 3 times daily for 80 days. 12/15/23 03/04/24 Yes Sonny Dust, MD   DULoxetine  (CYMBALTA ) 20 MG extended release capsule Take 1 capsule by mouth daily 11/22/23  Yes Shafiq, Nimra, MD   hydrOXYzine  HCl (ATARAX ) 25 MG tablet Take 1 tablet by mouth every 8 hours as needed for Itching 11/19/23  Yes Sonny Dust, MD   vitamin D  50 MCG (2000 UT) CAPS capsule Take 1 capsule by mouth daily 11/19/23  Yes Sonny Dust, MD   ferrous sulfate  (IRON 325) 325 (65 Fe) MG tablet Take 1 tablet by mouth daily (with breakfast) 11/19/23  Yes Sonny Dust, MD   methIMAzole  (TAPAZOLE ) 10 MG tablet 1 tablets 5 days a week, none on Saturday and Sunday 09/16/23  Yes Abusag, Joen Muskrat, MD   Glucagon  (BAQSIMI  ONE PACK) 3 MG/DOSE POWD For severe hypoglycemia 08/21/23  Yes Ezella Holly, APRN - CNS   insulin  lispro (HUMALOG ,ADMELOG ) 100 UNIT/ML SOLN injection vial Via insulin  pump max dose 40 units daily. 06/17/23  Yes Ezella Holly, APRN - CNS   propranolol  (INDERAL  LA) 120 MG extended release capsule Take 1 capsule by mouth daily   Yes [provider]   Elviteg-Cobic-Emtricit-TenofAF (GENVOYA) 150-150-200-10 MG TABS Take 1 tablet by mouth daily   Yes [provider]   atorvastatin  (LIPITOR) 20 MG tablet Take 1 tablet by mouth daily  Patient not taking: Reported on 12/31/2023 11/19/23   Sonny Dust, MD       Allergies   Allergen Reactions    Sulfa Antibiotics Hives and Swelling    Codeine Nausea Only       Social History     Socioeconomic History    Marital status: Divorced     Spouse name: Not on file    Number of children: Not on file    Years of education: Not on file    Highest education level: Not on file   Occupational History    Not on file   Tobacco Use    Smoking status: Every Day     Current packs/day: 0.50     Types: Cigarettes    Smokeless tobacco: Never   Substance and Sexual Activity    Alcohol use: No    Drug use: No    Sexual activity: Yes     Partners: Male   Other Topics Concern    Not on file   Social History Narrative    Not on file     Social Drivers of Health     Financial Resource Strain: Not on file   Food Insecurity: No Food Insecurity (05/07/2023)    Hunger Vital Sign     Worried About Running Out of Food in the Last Year: Never true     Ran Out of Food in the Last Year: Never true  Transportation Needs: Unmet Transportation Needs (05/07/2023)    PRAPARE - Therapist, art (Medical): Yes     Lack of Transportation (Non-Medical): Yes   Physical Activity: Not on file   Stress: Not on file   Social  Connections: Not on file   Intimate Partner Violence: Not on file   Housing Stability: High Risk (05/07/2023)    Housing Stability Vital Sign     Unable to Pay for Housing in the Last Year: Yes     Number of Times Moved in the Last Year: 0     Homeless in the Last Year: No       Family History   Problem Relation Age of Onset    Coronary Art Dis Mother     Alcohol Abuse Father        REVIEW OF SYSTEMS:     Patient specifically denies fever/chills, chest pain, shortness of breath, new bowel or bladder complaints.  All other review of systems was negative.  Review of Systems - documented reviewed.    PHYSICAL EXAMINATION:      BP (!) 148/88   Pulse 84   Temp 97.5 F (36.4 C) (Infrared)   Resp 18   Ht 1.575 m (5' 2)   Wt 59 kg (130 lb)   SpO2 98%   BMI 23.78 kg/m     General:      General appearance:  Pleasant and well-hydrated, in no distress and A & O x 3  Build:Normal Weight  Function: Rises from seated position easily and Moves about room without difficulty    HEENT:    Head:normocephalic, atraumatic  Pupils:regular, round, equal  Sclera: icterus absent    Lungs:    Breathing:normal breathing pattern     CVS:     RRR    Abdomen:    Shape:non-distended and normal  Tenderness:none  Guarding:none    Cervical spine:    Inspection:normal  Palpation:tenderness paravertebral muscles, tenderness trapezium, left, right and positive.  Range of motion:Normal flexion, extension, rotation bilaterally and is not painful.  Spurling's: negative bilaterally    Thoracic spine:     Spine inspection:normal   Palpation:No tenderness over the midline and paraspinal area, bilaterally  Range of motion:normal in flexion, extension rotation bilateral and is not painful.    Lumbar spine:    Spine inspection: Normal   Palpation: Tenderness paravertebral muscles Yes bilaterally  Range of motion: Decreased, flexion Decreased, Lateral bending, extension and rotation bilaterally reduced is painful.  Sacroiliac joint tenderness No  bilaterally  FABER test: negative bilaterally  Gaenslen's test:negative bilaterally   Piriformis tenderness: negative bilaterally  SLR : negative bilaterally    Musculoskeletal:    Trigger points - no    Extremities:    Tremors:None bilaterally upper and lower  Edema:no    Neurological:    Sensory: Normal to light touch     Motor:   Right Grip 5/5              Left Grip 5/5               Right Bicep 5/5           Left Bicep 5/5              Right Triceps 5/5       Left Triceps 5/5          Right Deltoid 5/5     Left Deltoid 5/5  Right Quadriceps 5/5          Left Quadriceps 5/5           Right Gastrocnemius 5/5    Left Gastrocnemius 5/5  Right Ant Tibialis 5/5  Left Ant Tibialis 5/5    Dermatology:    Skin:no rashes or lesions noted    Assessment/Plan:     Diagnosis Orders   1. Degeneration of intervertebral disc of lumbar region, unspecified whether pain present        2. Lumbar radicular pain        3. Lumbosacral spondylosis without myelopathy        4. Smoking history        5. Other specified diabetes mellitus with other specified complication, with long-term current use of insulin  [facility])          58 y.o. female with h/o low back pain and right lower extremity pain since February 2025.    Acute onset pain.  Has been treated conservatively.  Pain causes significant functional imitations.    She may have difficulty in doing therapy because of pain.    MRI of the lumbar spine reviewed personally discussed the findings with the patient.  L5-S1 Severe right neural foraminal narrowing with right foraminal disc  Herniation.  CT lumbar spine and x-ray of the hip reviewed.  History of smoking+  H/o DM +    Has tried gabapentin  low-dose and has tried low-dose Cymbalta .  Currently discontinued Cymbalta .  He is taking gabapentin  500 mg/day.    Plan  Increase the dose of gabapentin  to 300 mg 3 times daily can increase to 4 times a day if tolerated well.  Instructions and side effects reviewed.    Will plan to  do right lumbar Transforaminal ESI at L5 and S1 under fluoroscopic guidance.  RBA discussed patient agreed proceed.  Has history of diabetes-discussed effect of interventions on blood sugar level and to keep a close watch after the procedure.  Will add around on urgent basis considering that she is in significant pain causing significant functional limitations.    Physical therapy once pain controlled.  Will give short course on Norco  for prn use # 14-use only for severe pain. RBA of opioids reviewed.  She is not too keen on chronic opioids and I agree.    Discussed importance of smoking cessation spinal and general health.    She will follow-up after the interventions.    Counseling : Patient encouraged to stay active and to continue Regular home exercise program as tolerated - stretching / strengthening.    Smoking cessation counseling : yes     Treatment plan discussed with the patient including medication and procedure side effects.    Controlled Substances Monitoring: [place]ARRS reviewed.    Dagmar Drones, MD    CC:    Sonny Dust, MD  [address]  [place],  [place] [address]     NOTE: The above documentation was prepared using voice recognition software.  Every attempt was made to ensure accuracy but there may be spelling, grammatical, and contextual errors.

## 2023-12-31 NOTE — Progress Notes (Signed)
 Maria Valdez was advised that procedure was scheduled for 01/05/2024 and that [facility] should call her a few days before for the pre op call and between 2:00 PM and 4:00 PM  the business day before with the arrival time. Instructed Gracie to hold ibuprofen  for 24 hours, Celebrex, Mobic, and naprosyn  for 4 days and any aspirin containing products, CoQ 10, or fish oil for 7 days. Instructed to call office back if any questions. Destenee verbalized understanding.    Electronically signed by Dwight Givens, RN on 12/31/2023 at 3:50 PM

## 2023-12-31 NOTE — Progress Notes (Signed)
 Patient:  Maria Valdez, DOB Dec 04, 1965  Date of Service:  12/31/23      Patient presents to [facility] with complaints of lower back pain that started 4 months ago and has been getting worse.     She states the pain began following No specific cause    Pain is persistent and is described as aching, sharp, and numb. She rates the pain as a 10/10 on her worst day , 8/10 on her best day, and a 9/10 on average on the VAS scale.     Pain does radiate to right leg. She  has pain of the right leg.    Alleviating factors include: rest and lidocaine  spray.  Aggravating factors include:  movement, standing, sitting. She states that the pain does keep her from sleeping at night. She took her last dose of Neurontin  today.     She is not on NSAIDS and  is not on anticoagulation medications.    Previous treatments: medications..      Personal Expectations from this treatment: increase activity and decrease pain    Do you want someone present when the provider examines you? No    BP (!) 148/88   Pulse 84   Temp 97.5 F (36.4 C) (Infrared)   Resp 18   Ht 1.575 m (5' 2)   Wt 59 kg (130 lb)   SpO2 98%   BMI 23.78 kg/m     No LMP recorded.

## 2024-01-01 NOTE — Progress Notes (Signed)
 [facility]  INSTRUCTIONS  ...........................................................................................................................................     [x]  Parking the day of Surgery is located in the [employer] lot.  Upon entering the door, make immediate right into the surgery reception room    [x]   Bring photo ID and insurance card     [x]  You may have a light breakfast day of procedure    [x]   Wear loose comfortable clothing    [x]   Please follow instructions for medications as given per Dr's office    [x]  You can expect a call the business day prior to procedure to notify you of your arrival time     [x]  Please arrange for driver    []   Other instructions

## 2024-01-05 ENCOUNTER — Inpatient Hospital Stay: Admit: 2024-01-05 | Payer: PRIVATE HEALTH INSURANCE | Attending: Anesthesiology

## 2024-01-05 ENCOUNTER — Inpatient Hospital Stay: Payer: PRIVATE HEALTH INSURANCE

## 2024-01-05 ENCOUNTER — Encounter

## 2024-01-05 DIAGNOSIS — R52 Pain, unspecified: Secondary | ICD-10-CM

## 2024-01-05 LAB — POCT GLUCOSE: POC Glucose: 192 mg/dL — ABNORMAL HIGH (ref 74–99)

## 2024-01-05 MED ORDER — NORMAL SALINE FLUSH 0.9 % IV SOLN
0.9 | INTRAVENOUS | Status: DC | PRN
Start: 2024-01-05 — End: 2024-01-05

## 2024-01-05 MED ORDER — IOPAMIDOL 61 % IJ SOLN
61 | INTRAMUSCULAR | Status: DC | PRN
Start: 2024-01-05 — End: 2024-01-05
  Administered 2024-01-05: 15:00:00 2

## 2024-01-05 MED ORDER — LIDOCAINE HCL (PF) 0.5 % IJ SOLN
0.5 | INTRAMUSCULAR | Status: DC | PRN
Start: 2024-01-05 — End: 2024-01-05
  Administered 2024-01-05: 15:00:00 8

## 2024-01-05 MED ORDER — SODIUM CHLORIDE 0.9 % IV SOLN
0.9 | INTRAVENOUS | Status: DC | PRN
Start: 2024-01-05 — End: 2024-01-05

## 2024-01-05 MED ORDER — NORMAL SALINE FLUSH 0.9 % IV SOLN
0.9 | Freq: Two times a day (BID) | INTRAVENOUS | Status: DC
Start: 2024-01-05 — End: 2024-01-05

## 2024-01-05 MED ORDER — METHYLPREDNISOLONE ACETATE 40 MG/ML IJ SUSP
40 | INTRAMUSCULAR | Status: DC | PRN
Start: 2024-01-05 — End: 2024-01-05
  Administered 2024-01-05: 15:00:00 60 via EPIDURAL

## 2024-01-05 NOTE — Discharge Instructions (Signed)
 [facility] Department  Broadus John [phone]  Dr. Emelda Fear  Dr. Jovita Kussmaul Block/Radiofrequency  Home Going Instructions    1-Go home, rest for the remainder of the day  2-Please do not lift over 20 pounds the day of the injection  3-If you received sedation No: alcohol, driving, operating lawn mowers, plows, tractors or other dangerous equipment until next morning. Do not make important decisions or sign legal documents for 24 hours. You may experience light headedness, dizziness, nausea or sleepiness after sedation. Do not stay alone. A responsible adult must be with you for 24 hours. You could be nauseated from the medications you have received. Your IV site may be sore and bruised.    4-No dietary restrictions     5-Resume all medications the same day, blood thinners to be resumed 24 hours after injection if you were instructed to stop any.    6-Keep the surgical site clean and dry, you may shower the next morning and remove the      dressing.     7- No sitz baths, tub baths or hot tubs/swimming for 24 hours.       8- If you have any pain at the injection site(s), application of an ice pack to the area should be       helpful, 20 minutes on/20 minutes off for next 48 hours.  9- Call [facility] immediately at if you develop.  Fever greater than 100.4 F  Have bleeding or drainage from the puncture site  Have progressive Leg/arm numbness and or weakness  Loss of control of bowel and or bladder (wet/soil yourself)  Severe headache with inability to lift head  10-You may return to work the next day

## 2024-01-05 NOTE — Interval H&P Note (Signed)
 Update History & Physical    The patient's History and Physical of December 31, 2023 was reviewed with the patient and I examined the patient. There was no change. The surgical site was confirmed by the patient and me.     Plan: The risks, benefits, expected outcome, and alternative to the recommended procedure have been discussed with the patient. Patient understands and wants to proceed with the procedure.     Electronically signed by Trenton KATHEE Mathieu, MD on 01/05/2024

## 2024-01-05 NOTE — Progress Notes (Signed)
 Discharge instructions given to patient.

## 2024-01-05 NOTE — Op Note (Addendum)
 Operative Note      Patient: Maria Valdez  Date of Birth: 1966/01/12  MRN: [id number]    Date of Procedure: 01-06-24    Pre-Op Diagnosis Codes:      * Lumbar radicular pain [M54.16]    Post-Op Diagnosis: Same       Procedure(s):  RIGHT LUMBAR 4 AND SACRAL 1 TRANSFORAMINAL EPIDURAL STEROID INJECTION UNDER FLUOROSCOPIC GUIDANCE    Surgeon(s):  Wylodean Shimmel, Trenton NOVAK, MD    Assistant:   * No surgical staff found *    Anesthesia: Local    Estimated Blood Loss (mL): Minimal    Complications: None    Specimens:   * No specimens in log *    Implants:  * No implants in log *      Drains: * No LDAs found *    Findings:  Infection Present At Time Of Surgery (PATOS) (choose all levels that have infection present):  No infection present  Other Findings: good needle placement    Detailed Description of Procedure:   2024/01/06    Patient: Maria Valdez  DOB:  [birth year]  Age:  58 y.o.  Sex:  female     PRE-OPERATIVE DIAGNOSIS: Lumbar disc displacement, lumbar neural foraminal stenosis, lumbar radiculopathy.     POST-OPERATIVE DIAGNOSIS: Same.    PROCEDURE: Right Transforaminal epidural steroid injection under fluoroscopic guidance at foraminal level L4 & S1.    SURGEON: Trenton NOVAK Mathieu, MD    ANESTHESIA: Local    ESTIMATED BLOOD LOSS: None.  ______________________________________________________________________  BRIEF HISTORY: Maria Valdez comes in today for the first Right transforaminal epidural steroid injection under fluoroscopic guidance at foraminal level L4 & S1 . The potential complications of this procedure were discussed with her again today.  She has elected to undergo the aforementioned procedure.     [name]'s complete History & Physical examination were reviewed in depth, a copy of which is in the chart.      DESCRIPTION OF PROCEDURE:    After confirming written and informed consent, a time-out was performed and [name]'s name and date of birth, the procedure to be performed as well as the plan for  the location of the needle insertion were confirmed.    The patient was brought into the procedure room and placed in the prone position on the fluoroscopy table. Standard monitors were placed and vital signs were observed throughout the procedure. The area of the lumbar spine was prepped with chloraprep and draped in a sterile manner. The vertebral body was identified with AP fluoroscopy. An oblique view was obtained to better visualize the inferior junction of the pedicle and transverse process . The 6 o'clock position of the pedicle was marked and identified. The skin and subcutaneous tissue were anesthetized with 0.5% lidocaine . TWO # 22 gauge 3.5 inch pencil point needle was directed toward the targeted point under fluoroscopy until bone was contacted. The needle was then walked inferiorly until the neural foramen was entered . A lateral fluoroscopic view was then used to place the needle tip in the middle of the foramen. Negative aspiration was confirmed for blood and CSF and 0.5-1  cc of Isovue  M 300  contrast was injected at each level under live fluoroscopy. Appropriate neurograms were observed under AP fluoroscopy. Then after negative aspiration, a solution of the 3 cc of 0.5% lidocaine  and 30 mg DepoMedrol was easily injected at each level. The needles were removed with constant aspiration technique. The patient back was cleaned and a  bandage was placed over the needle insertion points.  Note: L5 appeared narrow- level changed to L4.    Disposition the patient tolerated the procedure well and there were no complications . Vital signs remained stable throughout the procedure. The patient was escorted to the recovery area where they remained until discharge and written discharge instructions for the procedure were given.    Plan: [name] will return to our pain management center as scheduled.     Trenton KATHEE Mathieu, MD

## 2024-01-12 ENCOUNTER — Encounter: Admit: 2024-01-12 | Discharge: 2024-01-12 | Payer: PRIVATE HEALTH INSURANCE

## 2024-01-12 DIAGNOSIS — M5126 Other intervertebral disc displacement, lumbar region: Principal | ICD-10-CM

## 2024-01-12 NOTE — Progress Notes (Cosign Needed)
 [facility]   Phone: [phone]   Fax: [phone]      Physical Therapy Treatment Note    Date: 01/12/2024  Patient Name: Maria Valdez  DOB: 1966/03/03   MRN: [id number]  DOInjury: end of February    DOSx: NA  Referring Provider:  Janell Lerner, MD  [address]  [place],  [place] [address]            Medical Diagnosis: M51.26 (ICD-10-CM) - Lumbar disc herniation     Precautions:  HIV    Outcome Measure:  Oswestry 60%     S: Pt reports pain today is not too bad 7/10 on arrival today. She states that she feels as though her injection she had last week helped.     O:  Time [id number]     Visit 2 Repeat outcome measure at mid point and end.    Pain See above      ROM      Modalities      MH + ES            Exercise      ALL EXERCISE DONE WITH DRAW-IN TECHNIQUE                            Functional activities To aid in reaching , pushing, pulling tasks at home     ROWS: H      ROWS: M 2 x 10  OTB    ROWS: L      Cross country ski  2 x 10  OTB    Step ups  X 10 each  6 inch      THEREX     Bike 5 min      SLR 2 x 10      Punches      Lat pulldowns      Triceps ext standing      Marching seated X 20 X 10 each     LAQ  2 x 10      Trunk ext TB      Trunk flex TB      Hip add  2 x 10  Ball     Hip abd 2 x 10      Hip EXT 2 x 10      TG Squats      LTR 10 x 3s hold  HEP    PPT X 20 with 3s hold HEP      A:  Tolerated fair. Pt reports while on the bike at the beginning of today's session that she has general pain all over, even my skin hurts. Asked pt if she informed physician of this and she reports that she has. Pt slow moving at times and required cues for proper technique. Above given as HEP with printed hand out. Rest breaks taken throughout session. Pt reports pain is a little worse than on arrival today more so on her R side. Indicating over SI joint.     P: Continue with rehab plan  Nafisah Runions, PTA 10120    Treatment Charges: Mins Units   Initial Evaluation      Re-Evaluation     Ther Exercise         TE 28 2   Manual Therapy     MT     Ther Activities        TA 10 1   Gait Training  GT     Neuro Re-education NR     Modalities     Non-Billable Service Time     Other     Total Time/Units 38 3

## 2024-01-19 ENCOUNTER — Encounter: Admit: 2024-01-19 | Discharge: 2024-01-19 | Payer: PRIVATE HEALTH INSURANCE

## 2024-01-19 DIAGNOSIS — M5126 Other intervertebral disc displacement, lumbar region: Principal | ICD-10-CM

## 2024-01-19 NOTE — Progress Notes (Cosign Needed)
 [facility]   Phone: [phone]   Fax: [phone]      Physical Therapy Treatment Note    Date: 01/19/2024  Patient Name: Maria Valdez  DOB: 1966-04-22   MRN: [id number]  DOInjury: end of February    DOSx: NA  Referring Provider:  Janell Lerner, MD  [address]  [place],  [place] [address]            Medical Diagnosis: M51.26 (ICD-10-CM) - Lumbar disc herniation     Precautions:  HIV    Outcome Measure:  Oswestry 60%     S: Pt reports pain is 6-7/10 today. Stated she had pain after last session later in the evening, improved by the following morning.   O:  Time [id number]     Visit 3 Repeat outcome measure at mid point and end.    Pain See above      ROM      Modalities      MH +5 min  With exercises          Exercise      ALL EXERCISE DONE WITH DRAW-IN TECHNIQUE                            Functional activities To aid in reaching , pushing, pulling tasks at home     ROWS: H      ROWS: M OTB    ROWS: L     Cross country ski  OTB    Step ups  6 inch      THEREX     Bike 8 min      LTR X 10 with 3s hold HEP    PPT X 20 HEP    SKTC 3 x 30s     SLR 2 x 10      Punches      Lat pulldowns      Triceps ext standing      Marching seated X 20 X 10 each     LAQ  2 x 10      Trunk ext TB      Trunk flex TB      Hip add  2 x 10  Ball     Hip abd 2 x 10      Hip EXT 2 x 10      TG Squats      SB roll out Next if pt tolerates SKTC well             A:  Tolerated fair. Added SKTC this session.  Pt reports non compliant with HEP. Encouraged Pt to perform HEP at least in the AM prior to getting out of bed, 2x per day if able. Pt agreed to try. Heat applied during seated exercises.  At end of session pt reports pain is 5-6/10. Just a little sore    P: Continue with rehab plan  Brandan Glauber, PTA 10120    Treatment Charges: Mins Units   Initial Evaluation     Re-Evaluation     Ther Exercise         TE 28 2   Manual Therapy     MT     Ther Activities        TA 10 1   Gait Training          GT      Neuro Re-education NR     Modalities  Non-Billable Service Time     Other     Total Time/Units 38 3

## 2024-01-21 ENCOUNTER — Ambulatory Visit: Admit: 2024-01-21 | Discharge: 2024-01-21 | Payer: PRIVATE HEALTH INSURANCE

## 2024-01-21 ENCOUNTER — Encounter
Admit: 2024-01-21 | Discharge: 2024-01-21 | Payer: PRIVATE HEALTH INSURANCE | Attending: Rehabilitative and Restorative Service Providers"

## 2024-01-21 VITALS — BP 100/54 | HR 83 | Temp 98.20000°F | Resp 14 | Ht 62.0 in | Wt 129.4 lb

## 2024-01-21 DIAGNOSIS — M545 Low back pain, unspecified: Principal | ICD-10-CM

## 2024-01-21 DIAGNOSIS — M5126 Other intervertebral disc displacement, lumbar region: Principal | ICD-10-CM

## 2024-01-21 MED ORDER — FERROUS SULFATE 325 (65 FE) MG PO TABS
325 | ORAL_TABLET | Freq: Every day | ORAL | 3 refills | 30.00000 days | Status: DC
Start: 2024-01-21 — End: 2024-08-17

## 2024-01-21 MED ORDER — PROPRANOLOL HCL ER 120 MG PO CP24
120 | ORAL_CAPSULE | Freq: Every day | ORAL | 3 refills | 90.00000 days | Status: DC
Start: 2024-01-21 — End: 2024-08-17

## 2024-01-21 MED ORDER — METHIMAZOLE 10 MG PO TABS
10 | ORAL_TABLET | ORAL | 3 refills | 30.00000 days | Status: DC
Start: 2024-01-21 — End: 2024-04-06

## 2024-01-21 MED ORDER — ATORVASTATIN CALCIUM 20 MG PO TABS
20 | ORAL_TABLET | Freq: Every day | ORAL | 3 refills | 90.00000 days | Status: DC
Start: 2024-01-21 — End: 2024-08-17

## 2024-01-21 NOTE — Progress Notes (Unsigned)
 [facility]  Precepting Note    Subjective:  Low back pain  Improved  Had injections  MRI reviewed.   Making progress  Working with PT.    Hyperthyroidism and T1DM  Follows with endo  Will send refills to the correct pharmacy.  Endo should continue to manage.     HIV  Follows with [facility], not detectable for years.     FU 4 months for yearly.     ROS otherwise negative     Past medical, surgical, family and social history were reviewed, non-contributory, and unchanged unless otherwise stated.    Objective:    BP (!) 100/54 (BP Site: Left Upper Arm, Patient Position: Sitting, BP Cuff Size: Medium Adult)   Pulse 83   Temp 98.2 F (36.8 C) (Temporal)   Resp 14   Ht 1.575 m (5' 2)   Wt 58.7 kg (129 lb 6.4 oz)   LMP 06/26/2016   SpO2 98%   BMI 23.67 kg/m     Exam is as noted by resident with the following changes, additions or corrections:    General:  NAD; alert & oriented x 3   Heart:  RRR, no murmurs, gallops, or rubs.  Lungs:  CTA bilaterally, no wheeze, rales or rhonchi  Abd: bowel sounds present, nontender, nondistended, no masses  Extrem:  No clubbing, cyanosis, or edema    Assessment/Plan:  Lumbar pain  Significantly improved.   Continue current regimen.     Refills to different pharmacy.     Hm updating       Attending Physician Statement  I have reviewed the chart, including any radiology or labs. I have discussed the case, including pertinent history and exam findings with the resident.  I agree with the assessment, plan and orders as documented by the resident.  Please refer to the resident note for additional information.      Electronically signed by JINNY MAROLYN RUA, MD on 01/21/2024 at 4:22 PM

## 2024-01-21 NOTE — Progress Notes (Signed)
[facility]   Phone: [phone]   Fax: [phone]      Physical Therapy Treatment Note    Date: 01/21/2024  Patient Name: Maria Valdez  DOB: 04-May-1966   MRN: [id number]  DOInjury: end of February    DOSx: NA  Referring Provider:  Janell Lerner, MD  [address]  [place],  [place] [address]            Medical Diagnosis: M51.26 (ICD-10-CM) - Lumbar disc herniation     Precautions:  HIV    Outcome Measure:  Oswestry 60%     S: Pt reports pain is 5-6/10 today. Pt reports she had pain after last session later in the evening, improved by the following morning.   O:  Time 16:30-1657 :    Visit 4 Repeat outcome measure at mid point and end.    Pain See above      ROM      Modalities      MH +With exercises          Exercise      ALL EXERCISE DONE WITH DRAW-IN TECHNIQUE                            Functional activities To aid in reaching , pushing, pulling tasks at home     ROWS: H      ROWS: M OTB    ROWS: L     Cross country ski  OTB    Step ups  6 inch      THEREX     Bike 5 min      LTR X 10 with 3s hold HEP    PPT X 20 HEP    SKTC 3 x 30s     SLR 2 x 10      Punches      Lat pulldowns      Triceps ext standing      Marching seated X 20 X 10 each     LAQ  2 x 10      Trunk ext TB      Trunk flex TB      Hip add  2 x 10  Ball     Hip abd 2 x 10      Hip EXT 2 x 10      TG Squats      SB roll out 2 x 10             A:  Tolerated fair. Added SB roll out this session.  Pt reports non compliant with HEP.   At end of session pt reports pain is 5-6/10. Pt reports pain got worse after session, when asked to describe stated it was 'sore'.     P: Continue with rehab plan  Maria Valdez, SPT  [place], [place] [id number]    Treatment Charges: Mins Units   Initial Evaluation     Re-Evaluation     Ther Exercise         TE 27 2   Manual Therapy     MT     Ther Activities        TA     Gait Training          GT     Neuro Re-education NR     Modalities     Non-Billable Service Time     Other      Total Time/Units  27 2

## 2024-01-21 NOTE — Progress Notes (Unsigned)
 [facility]  Department of Family Medicine  Family Medicine Residency Program      Patient: Maria Valdez 58 y.o. female     Date of Service: 01/21/24      Chief complaint:   Chief Complaint   Patient presents with    Follow-up     Lumbar pain has improved significantly. Continuing physical therapy (today is 3rd visit).        HISTORY OF PRESENTING ILLNESS     58 y.o. female presented to the clinic for a follow up on back pain. MRI done and  showed DDD, facet arthropathy, L5-S1, lobulated right paracentral disc herniation. Severe right neural foraminal narrowing with right foraminal disc  herniation. Seen PT and Pain mangement and got injections, much improved.     Follows Dr Marceline for HIV   Follows endo for type 1 diabetes     Has had mammo, pap, c-scope.  Goes to [facility]. .         Health Maintenance:  Health Maintenance Due   Topic Date Due    Meningococcal (ACWY) vaccine (1 - Risk 2-dose series) Never done    COVID-19 Vaccine (1) Never done    Diabetic Alb to Cr ratio (uACR) test  Never done    Measles,Mumps,Rubella (MMR) vaccine (1 of 2 - Risk 2-dose series) Never done    Hepatitis A vaccine (1 of 2 - Risk 2-dose series) Never done    Hepatitis B vaccine (1 of 3 - 19+ 3-dose series) Never done    DTaP/Tdap/Td vaccine (1 - Tdap) Never done    Shingles vaccine (1 of 2) Never done    Pneumococcal 50+ years Vaccine (1 of 2 - PCV) Never done    Cervical cancer screen  Never done    Breast cancer screen  Never done    Colorectal Cancer Screen  Never done    Lung Cancer Screening &/or Counseling  09/10/2019     Past Medical History:      Diagnosis Date    Diabetes mellitus (HCC)     HIV infection (HCC)     HTN (hypertension) 08/19/2011    Hyperlipidemia     Lumbar pain     Thyroid disease      Past Surgical History:        Procedure Laterality Date    CHOLECYSTECTOMY      COLONOSCOPY      PAIN MANAGEMENT PROCEDURE Right 01/05/2024    RIGHT LUMBAR 4 AND SACRAL 1 TRANSFORAMINAL EPIDURAL  STEROID INJECTION UNDER FLUOROSCOPIC GUIDANCE performed by Keitha Trenton NOVAK, MD at [facility]    THYROID SURGERY      TONSILLECTOMY       Allergies:    Sulfa antibiotics and Codeine  Social History:   Social History     Socioeconomic History    Marital status: Divorced     Spouse name: Not on file    Number of children: Not on file    Years of education: Not on file    Highest education level: Not on file   Occupational History    Not on file   Tobacco Use    Smoking status: Every Day     Current packs/day: 0.50     Average packs/day: 0.5 packs/day for 40.7 years (20.3 ttl pk-yrs)     Types: Cigarettes     Start date: 05/25/1983     Passive exposure: Never    Smokeless tobacco: Never  Vaping Use    Vaping status: Never Used   Substance and Sexual Activity    Alcohol use: No    Drug use: No    Sexual activity: Yes     Partners: Male   Other Topics Concern    Not on file   Social History Narrative    Not on file     Social Drivers of Health     Financial Resource Strain: Not on file   Food Insecurity: No Food Insecurity (05/07/2023)    Hunger Vital Sign     Worried About Running Out of Food in the Last Year: Never true     Ran Out of Food in the Last Year: Never true   Transportation Needs: Unmet Transportation Needs (05/07/2023)    PRAPARE - Therapist, art (Medical): Yes     Lack of Transportation (Non-Medical): Yes   Physical Activity: Not on file   Stress: Not on file   Social Connections: Not on file   Intimate Partner Violence: Not on file   Housing Stability: High Risk (05/07/2023)    Housing Stability Vital Sign     Unable to Pay for Housing in the Last Year: Yes     Number of Times Moved in the Last Year: 0     Homeless in the Last Year: No      Family History:       Problem Relation Age of Onset    Coronary Art Dis Mother     Alcohol Abuse Father      Review of Systems:   Review of Systems   All other systems reviewed and are negative.      PHYSICAL EXAM   Vitals: BP (!) 100/54  (BP Site: Left Upper Arm, Patient Position: Sitting, BP Cuff Size: Medium Adult)   Pulse 83   Temp 98.2 F (36.8 C) (Temporal)   Resp 14   Ht 1.575 m (5' 2)   Wt 58.7 kg (129 lb 6.4 oz)   LMP 06/26/2016   SpO2 98%   BMI 23.67 kg/m   Physical Exam  Constitutional:       General: She is not in acute distress.  HENT:      Head: Normocephalic and atraumatic.   Eyes:      Extraocular Movements: Extraocular movements intact.   Cardiovascular:      Rate and Rhythm: Normal rate and regular rhythm.      Heart sounds: No murmur heard.     No friction rub. No gallop.   Pulmonary:      Breath sounds: Normal breath sounds. No wheezing, rhonchi or rales.   Abdominal:      Tenderness: There is no abdominal tenderness. There is no guarding.   Musculoskeletal:      Right lower leg: No edema.      Left lower leg: No edema.      Comments: Patient with tenderness to palpation of bilateral low back   Neurological:      Mental Status: She is alert.      Sensory: No sensory deficit.      Motor: No weakness.           ASSESSMENT AND PLAN     1. Acute low back pain, unspecified back pain laterality, unspecified whether sciatica present  Continue to follow with PT and Pain management, much improved     2. Hyperthyroidism  Will send scripts of propranalol and methimazole  to her proper  pharmacy but advised her these meds should be managed by her endocrinologist in future   - methIMAzole  (TAPAZOLE ) 10 MG tablet; 1 tablets 5 days a week, none on Saturday and Sunday  Dispense: 90 tablet; Refill: 3  - propranolol  (INDERAL  LA) 120 MG extended release capsule; Take 1 capsule by mouth daily  Dispense: 30 capsule; Refill: 3    3. Type 1 diabetes mellitus without complication (HCC)  Foot exam normal, will send lipitor script, continue insulin  pump per endo   - HM DIABETES FOOT EXAM  - atorvastatin  (LIPITOR) 20 MG tablet; Take 1 tablet by mouth daily  Dispense: 30 tablet; Refill: 3    4. Iron deficiency anemia, unspecified iron deficiency  anemia type  Refill home iron supplement   - ferrous sulfate  (IRON 325) 325 (65 Fe) MG tablet; Take 1 tablet by mouth daily (with breakfast)  Dispense: 30 tablet; Refill: 3      Counseled regarding above diagnosis, including possible risks and complications, especially if left uncontrolled. Counseled regarding the possible side effects, risks, benefits and alternatives to treatment; patient and/or guardian verbalizes understanding, agrees, feels comfortable with and wishes to proceed with above treatment plan.    Call or go to ED immediately if symptoms worsen or persist. Advised patient to call with any new medication issues, and, as applicable, read all Rx info from pharmacy to assure aware of all possible risks and side effects of medication before taking.     Patient and/or guardian given opportunity to ask questions/raise concerns.The patient verbalized comfort and understanding of instructions.    I encourage further reading and education about your health conditions.Information on many health conditions is provided by the American Academy of Family Physicians: https://familydoctor.org/  Please bring any questions to me at your next visit.    Medication List:    Current Outpatient Medications   Medication Sig Dispense Refill    methIMAzole  (TAPAZOLE ) 10 MG tablet 1 tablets 5 days a week, none on Saturday and Sunday 90 tablet 3    propranolol  (INDERAL  LA) 120 MG extended release capsule Take 1 capsule by mouth daily 30 capsule 3    atorvastatin  (LIPITOR) 20 MG tablet Take 1 tablet by mouth daily 30 tablet 3    ferrous sulfate  (IRON 325) 325 (65 Fe) MG tablet Take 1 tablet by mouth daily (with breakfast) 30 tablet 3    Cranberry 500 MG CAPS Take 2 capsules by mouth daily      gabapentin  (NEURONTIN ) 300 MG capsule Take 1 capsule by mouth 3 times daily for 90 days. Intended supply: 30 days 90 capsule 2    hydrOXYzine  HCl (ATARAX ) 25 MG tablet Take 1 tablet by mouth every 8 hours as needed for Itching 30 tablet 1     vitamin D  50 MCG (2000 UT) CAPS capsule Take 1 capsule by mouth daily 30 capsule 3    Glucagon  (BAQSIMI  ONE PACK) 3 MG/DOSE POWD For severe hypoglycemia 1 each 1    insulin  lispro (HUMALOG ,ADMELOG ) 100 UNIT/ML SOLN injection vial Via insulin  pump max dose 40 units daily. 20 mL 5    Elviteg-Cobic-Emtricit-TenofAF (GENVOYA) 150-150-200-10 MG TABS Take 1 tablet by mouth daily      vitamin B-12 (CYANOCOBALAMIN) 1000 MCG tablet Take 1 tablet by mouth daily (Patient not taking: Reported on 01/21/2024)       No current facility-administered medications for this visit.      Return to Office: Return in about 4 months (around 05/23/2024) for yearly physical .  This document may have been prepared at least partially through the use of voice recognition software. Although effort is taken to assure the accuracy of this document, it is possible that grammatical, syntax,  or spelling errors may occur.    Dempsey Dee, MD

## 2024-01-26 ENCOUNTER — Encounter
Admit: 2024-01-26 | Discharge: 2024-01-26 | Payer: PRIVATE HEALTH INSURANCE | Attending: Rehabilitative and Restorative Service Providers"

## 2024-01-26 DIAGNOSIS — M5126 Other intervertebral disc displacement, lumbar region: Principal | ICD-10-CM

## 2024-01-26 NOTE — Progress Notes (Signed)
 [facility]   Phone: [phone]   Fax: [phone]      Physical Therapy Treatment Note    Date: 01/26/2024  Patient Name: Maria Valdez  DOB: [birth year]   MRN: [id number]  DOInjury: end of February    DOSx: NA  Referring Provider:  Janell Lerner, MD  [address]  [place],  [place] [address]            Medical Diagnosis: M51.26 (ICD-10-CM) - Lumbar disc herniation     Precautions:  HIV    Outcome Measure:  Oswestry 60%     S: Pt reports pain is 6/10 today. Pt reports feeling sore and tired from work.  Pt reports achy pain in R LE. Also reports pain and points to area of  SIJ/piriformis.   O:  Time 1658 - 1739 :    Visit 5 Repeat outcome measure at mid point and end.    Pain See above      ROM      Modalities      MH +With exercises          Exercise      ALL EXERCISE DONE WITH DRAW-IN TECHNIQUE                            Functional activities To aid in reaching , pushing, pulling tasks at home     ROWS: H      ROWS: M OTB    ROWS: L     Cross country ski  OTB    Step ups  6 inch      THEREX     Bike 8 min      LTR X 10 with 3s hold HEP    PPT X 20 HEP    SKTC 3 x 30s     SLR 2 x 10      Piriformis Stretch 3 x 30 s Seated     IT Band Stretch 2 X 30 s     Punches      Lat pulldowns      Triceps ext standing      Marching seated X 20     LAQ  2 x 10      Trunk ext TB      Trunk flex TB      Hip add  2 x 10  Ball     Hip abd 2 x 10      Hip EXT 2 x 10      TG Squats      SB roll out 2 x 10 x 3s hold     Quadruped alt leg ext  Trial  next time early in session       A:  Tolerated fair. Added Piriformis stretch; tolerated it very well. Pt reports discomfort in R IT band during side kicks. Added IT band stretch and pt reports pain during but it is tolerable. At end of session pt reports pain is 6/10. Pt reports pain got worse after session, when asked to describe stated it was 'sore'.     P: Continue with rehab plan  [name], SPT  [place], [place] [address]    Treatment Charges:  Mins Units   Initial Evaluation     Re-Evaluation     Ther Exercise         TE 41 3   Manual Therapy     MT  Ther Activities        TA     Gait Training          GT     Neuro Re-education NR     Modalities     Non-Billable Service Time     Other     Total Time/Units 41 3

## 2024-02-02 ENCOUNTER — Encounter: Admit: 2024-02-02 | Discharge: 2024-02-02 | Payer: PRIVATE HEALTH INSURANCE

## 2024-02-02 DIAGNOSIS — M5126 Other intervertebral disc displacement, lumbar region: Principal | ICD-10-CM

## 2024-02-02 NOTE — Progress Notes (Signed)
 [facility]   Phone: [phone]   Fax: [phone]      Physical Therapy Treatment Note    Date: 02/02/2024  Patient Name: Maria Valdez  DOB: 12/09/1965   MRN: [id number]  DOInjury: end of February    DOSx: NA  Referring Provider:  Janell Lerner, MD  [address]  [place],  [place] [address]            Medical Diagnosis: M51.26 (ICD-10-CM) - Lumbar disc herniation     Precautions:  HIV    Outcome Measure:  Oswestry 60%     S: Pt reports pain is 5/10 today R side of low back into buttocks. She reports after last session she was in extreme pain that evening and throughout the night. Reports pain was so bad she was in tears. Will adjust session today accordingly.       O:  Time 8296-8258 :    Visit 6 Repeat outcome measure at mid point and end.    Pain See above      ROM      Modalities      MH +With exercises          Exercise      ALL EXERCISE DONE WITH DRAW-IN TECHNIQUE                            Functional activities To aid in reaching , pushing, pulling tasks at home     ROWS: H      ROWS: M OTB    ROWS: L     Cross country ski  OTB    Step ups  X 10 each  6 inch      THEREX     Bike 5 min      LTR X 10 with 3s hold HEP    PPT X 20 HEP    SKTC 3 x 30s     SLR 2 x 10      Bridges     Piriformis Stretch Seated     Modified piriformis stretch 3 x 30s     IT Band Stretch     Punches      Lat pulldowns      Triceps ext standing      Marching seated X 20     LAQ  2 x 10      Trunk ext TB      Trunk flex TB      Hip add  2 x 10  Ball     Hip abd 2 x 10      Hip EXT 2 x 10      TG Squats      SB roll out 2 x 10 x 3s hold     Quadruped alt leg ext          A:  Tolerated fair. Pt reports muscle fatigue on bike therefore did 5 min. today. Pt reports pain is about the same. Demonstrated tennis ball massage for pt, she will try at home. Also discussed her work set up. Pt report she sits in an oversized chair at work and typically sits at the edge of the chair and no support. Recommended  that pt try and get a proper fitting chair so that she has support on her low back and feet flat on the floor.     P: Continue with rehab plan    Deshawn Skelley, PTA  [address]    Treatment Charges: Mins Units   Initial Evaluation     Re-Evaluation     Ther Exercise         TE 28 2   Manual Therapy     MT     Ther Activities        TA 10 1   Gait Training          GT     Neuro Re-education NR     Modalities     Non-Billable Service Time     Other     Total Time/Units 38 3

## 2024-02-04 ENCOUNTER — Encounter
Admit: 2024-02-04 | Discharge: 2024-02-04 | Payer: PRIVATE HEALTH INSURANCE | Attending: Rehabilitative and Restorative Service Providers"

## 2024-02-04 DIAGNOSIS — M5126 Other intervertebral disc displacement, lumbar region: Principal | ICD-10-CM

## 2024-02-04 NOTE — Progress Notes (Signed)
 [facility]   Phone: [phone]   Fax: [phone]      Physical Therapy Treatment Note    Date: 02/04/2024  Patient Name: Maria Valdez  DOB: 11-10-1965   MRN: [id number]  DOInjury: end of February    DOSx: NA  Referring Provider:  Janell Lerner, MD  [address]  [place],  [place] [address]            Medical Diagnosis: M51.26 (ICD-10-CM) - Lumbar disc herniation     Precautions:  HIV    Outcome Measure:  Oswestry 60%     S: Pt reports feeling sore from Monday's session. Pt reports soreness is in glutes and down lateral Right thigh. Pt reports no pain prior to session just soreness.       O:  Time 8383-8351 :    Visit 7 Repeat outcome measure at mid point and end.    Pain See above      ROM      Modalities      MH +With exercises          Exercise      ALL EXERCISE DONE WITH DRAW-IN TECHNIQUE                            Functional activities To aid in reaching , pushing, pulling tasks at home     ROWS: H      ROWS: M OTB    ROWS: L     Cross country ski  OTB    Step ups  X 10 each  6 inch      THEREX     Bike 5 min      Glute Sets 2 x 10     LTR X 10 with 3s hold HEP    PPT X 20 HEP    SKTC 3 x 30s     SLR 2 x 10      Bridges     Modified piriformis stretch     IT Band Stretch     Punches      Lat pulldowns      Triceps ext standing      Marching seated X 20     LAQ  2 x 10      Trunk ext TB      Trunk flex TB      Hip add  2 x 10  Ball     Hip abd     Hip EXT     TG Squats      SB roll out 2 x 10 x 3s hold     Quadruped alt leg ext          A:  Tolerated fair. Pt fatigued half way through session. Due to fatigue session ended early to prevent more soreness and pain pt described. Pt reports no pain after session. Pt reports feeling sore and achy after session.     P: Continue with rehab plan  [name], SPT  [place], [place] [address]    Treatment Charges: Mins Units   Initial Evaluation     Re-Evaluation     Ther Exercise         TE 32 2   Manual Therapy     MT     Ther  Activities        TA     Gait Training  GT     Neuro Re-education NR     Modalities     Non-Billable Service Time     Other     Total Time/Units 32 2

## 2024-02-11 ENCOUNTER — Encounter
Admit: 2024-02-11 | Discharge: 2024-02-11 | Payer: PRIVATE HEALTH INSURANCE | Attending: Rehabilitative and Restorative Service Providers"

## 2024-02-11 DIAGNOSIS — M5126 Other intervertebral disc displacement, lumbar region: Principal | ICD-10-CM

## 2024-02-11 NOTE — Progress Notes (Signed)
 [facility]   Phone: [phone]   Fax: [phone]      Physical Therapy Treatment Note    Date: 02/11/2024  Patient Name: Maria Valdez  DOB: May 06, 1966   MRN: [id number]  DOInjury: end of February    DOSx: NA  Referring Provider:  Janell Lerner, MD  [address]  [place],  [place] [address]            Medical Diagnosis: M51.26 (ICD-10-CM) - Lumbar disc herniation     Precautions:  HIV    Outcome Measure:  Oswestry 60%     S: Pt reports her week off calmed things down. Pt reports she's been worse the last 2 days. Pt reports pain today is in her right hip down her leg. Pt reports pain in her back is very dull.       O:  Time 8379-8351 :    Visit 8 Repeat outcome measure at mid point and end.    Pain See above      ROM      Modalities      MH +With exercises          Exercise      ALL EXERCISE DONE WITH DRAW-IN TECHNIQUE                            Functional activities To aid in reaching , pushing, pulling tasks at home     ROWS: H      ROWS: M OTB    ROWS: L     Cross country ski  OTB    Step ups  X 10 each  6 inch      THEREX     Bike 10 min      Glute Sets 2 x 10     LTR X 10 with 3s hold HEP    PPT X 20 HEP    SKTC 3 x 30s     SLR 2 x 10      Bridges 2 x 10      Modified piriformis stretch     IT Band Stretch     Punches      Lat pulldowns      Triceps ext standing      Marching seated X 20     LAQ  2 x 10      Trunk ext TB      Trunk flex TB      Hip add  2 x 10  Ball     Hip abd 2 x 10      Hip EXT 2 x 10      TG Squats      SB roll out 2 x 10 x 3s hold     Quadruped alt leg ext          A:  Tolerated well. Pt reports pain is the same as coming in. Pt reports feeling a bit less stiff.     P: Continue with rehab plan.     [name], SPT  [place], [place] [id number]    Treatment Charges: Mins Units   Initial Evaluation     Re-Evaluation     Ther Exercise         TE 28 2   Manual Therapy     MT     Ther Activities        TA     Gait Training  GT     Neuro  Re-education NR     Modalities     Non-Billable Service Time     Other     Total Time/Units 28 2

## 2024-02-18 ENCOUNTER — Encounter: Payer: PRIVATE HEALTH INSURANCE | Attending: Rehabilitative and Restorative Service Providers"

## 2024-02-18 NOTE — Progress Notes (Unsigned)
 [facility]   Phone: [phone]   Fax: [phone]      Physical Therapy Treatment Note    Date: 02/18/2024  Patient Name: Maria Valdez  DOB: 01-20-1966   MRN: [id number]  DOInjury: end of February    DOSx: NA  Referring Provider:  Janell Lerner, MD  [address]  [place],  [place] [address]            Medical Diagnosis: M51.26 (ICD-10-CM) - Lumbar disc herniation     Precautions:  HIV    Outcome Measure:  Oswestry 60%     S: Pt reports her week off calmed things down. Pt reports she's been worse the last 2 days. Pt reports pain today is in her right hip down her leg. Pt reports pain in her back is very dull.       O:  Time 8379-8351 :    Visit 8 Repeat outcome measure at mid point and end.    Pain See above      ROM      Modalities      MH +With exercises          Exercise      ALL EXERCISE DONE WITH DRAW-IN TECHNIQUE                            Functional activities To aid in reaching , pushing, pulling tasks at home     ROWS: H      ROWS: M OTB    ROWS: L     Cross country ski  OTB    Step ups  X 10 each  6 inch      THEREX     Bike 10 min      Glute Sets 2 x 10     LTR X 10 with 3s hold HEP    PPT X 20 HEP    SKTC 3 x 30s     SLR 2 x 10      Bridges 2 x 10      Modified piriformis stretch     IT Band Stretch     Punches      Lat pulldowns      Triceps ext standing      Marching seated X 20     LAQ  2 x 10      Trunk ext TB      Trunk flex TB      Hip add  2 x 10  Ball     Hip abd 2 x 10      Hip EXT 2 x 10      TG Squats      SB roll out 2 x 10 x 3s hold     Quadruped alt leg ext          A:  Tolerated well. Pt reports pain is the same as coming in. Pt reports feeling a bit less stiff.     P: Continue with rehab plan.     [name], SPT  [place],  [id number]    Treatment Charges: Mins Units   Initial Evaluation     Re-Evaluation     Ther Exercise         TE 28 2   Manual Therapy     MT     Ther Activities        TA     Gait Training  GT     Neuro  Re-education NR     Modalities     Non-Billable Service Time     Other     Total Time/Units 28 2

## 2024-02-23 ENCOUNTER — Encounter: Admit: 2024-02-23 | Discharge: 2024-02-23 | Payer: PRIVATE HEALTH INSURANCE

## 2024-02-23 DIAGNOSIS — M5126 Other intervertebral disc displacement, lumbar region: Principal | ICD-10-CM

## 2024-02-23 NOTE — Progress Notes (Cosign Needed)
 [facility]   Phone: [phone]   Fax: [phone]      Physical Therapy Treatment Note    Date: 02/23/2024  Patient Name: Maria Valdez  DOB: December 25, 1965   MRN: [id number]  DOInjury: end of February    DOSx: NA  Referring Provider:  Janell Lerner, MD  [address]  [place],  [place] [address]            Medical Diagnosis: M51.26 (ICD-10-CM) - Lumbar disc herniation     Precautions:  HIV    Outcome Measure:  Oswestry 60%     S: Pt reports pain is 3-4/10 today.     O:  Time 8381-8343 :    Visit 9 Repeat outcome measure at mid point and end.    Pain See above      ROM      Modalities      MH +With exercises          Exercise      ALL EXERCISE DONE WITH DRAW-IN TECHNIQUE                            Functional activities To aid in reaching , pushing, pulling tasks at home     ROWS: H      ROWS: M OTB    ROWS: L     Cross country ski  OTB    Step ups  X 20 each  6 inch      THEREX     Bike 10 min      Glute Sets 2 x 10     LTR X 10 with 3s hold HEP    PPT X 20 HEP    SKTC 3 x 30s     SLR 2 x 10      Supine marches X 20      Bridges 2 x 10      Modified piriformis stretch     IT Band Stretch     Punches      Lat pulldowns      Triceps ext standing      Marching seated X 20     LAQ  2 x 10      Trunk ext TB      Trunk flex TB      Hip add  2 x 10  Ball     Hip abd  2 x 10  BTB    Hip abd 2 x 10      Hip EXT 2 x 10      TG Squats      SB roll out fwd and lateral  2 x 10 x 3s hold     Quadruped alt leg ext          A:  Tolerated well. Pt reports pain is the same as on arrival.       P: Continue with rehab plan.       Maria Valdez, PTA 101[address]    Treatment Charges: Mins Units   Initial Evaluation     Re-Evaluation     Ther Exercise         TE 28 2   Manual Therapy     MT     Ther Activities        TA 10 1   Gait Training          GT     Neuro Re-education NR  Modalities     Non-Billable Service Time     Other     Total Time/Units 38 3

## 2024-02-25 ENCOUNTER — Encounter: Payer: PRIVATE HEALTH INSURANCE | Attending: Rehabilitative and Restorative Service Providers"

## 2024-03-01 ENCOUNTER — Encounter: Admit: 2024-03-01 | Discharge: 2024-03-01 | Payer: PRIVATE HEALTH INSURANCE

## 2024-03-01 DIAGNOSIS — M5126 Other intervertebral disc displacement, lumbar region: Principal | ICD-10-CM

## 2024-03-01 NOTE — Progress Notes (Signed)
 [facility]   Phone: [phone]   Fax: [phone]      Physical Therapy Treatment Note    Date: 03/01/2024  Patient Name: Maria Valdez  DOB: 10-02-1965   MRN: [id number]  DOInjury: end of February    DOSx: NA  Referring Provider:  Janell Lerner, MD  [address]  [place],  [place] [address]            Medical Diagnosis: M51.26 (ICD-10-CM) - Lumbar disc herniation     Precautions:  HIV    Outcome Measure:  Oswestry 60%     S: Pt reports pain today is a 3/10. Pt reports that therapy has helped but she is still feeling achy after work. Also reports she hasn't been 100 percent compliant with medications she's been prescribed. Pt would like today to be her last session.     O:  Time 8383-8354 :    Visit 10 Repeat outcome measure at mid point and end.    Pain See above      ROM      Modalities      MH +With exercises          Exercise      ALL EXERCISE DONE WITH DRAW-IN TECHNIQUE                            Functional activities To aid in reaching , pushing, pulling tasks at home     ROWS: H      ROWS: M OTB    ROWS: L     Cross country ski  OTB    Step ups  X 20 each  6 inch      THEREX     Bike 5 min      Glute Sets     LTR X 10 with 3s hold HEP    PPT X 20 HEP    SKTC 3 x 30s     SLR 2 x 10      Supine marches X 20      Bridges 2 x 10      Modified piriformis stretch     IT Band Stretch     Punches      Lat pulldowns      Triceps ext standing      Marching seated X 20     LAQ  2 x 10      Trunk ext TB      Trunk flex TB      Hip add  2 x 10  Ball     Hip abd  2 x 10  BTB    Hip abd 2 x 10      Hip EXT 2 x 10      TG Squats      SB roll out fwd and lateral  2 x 10 x 3s hold     Quadruped alt leg ext          A:  Tolerated well. Pt reports pain is the same as on arrival. Pt requested to be done early today as her daughter has a volleyball game this evening in [place]. Pt would like to self discharge today. Pt reports she has her exercises memorized and does not need additional print  outs. Also reports despite aching and occasional pain in the evening after work, she's feeling significantly better.       P: Continue with rehab plan.  Gatha Mcnulty, PTA 10120    Treatment Charges: Mins Units   Initial Evaluation     Re-Evaluation     Ther Exercise         TE 19 1   Manual Therapy     MT     Ther Activities        TA 10 1   Gait Training          GT     Neuro Re-education NR     Modalities     Non-Billable Service Time     Other     Total Time/Units 29 2

## 2024-03-04 NOTE — Progress Notes (Signed)
 [facility]   Phone: [phone]   Fax: [phone]      Discharge Summary        REASON FOR DISCHARGE: pt chose to self discharge after last session.        RECOMMENDATIONS: call c questions or if additional services are warranted.      Thank you for the opportunity to work with your patient. If you have questions or comments, please contact me at numbers listed above.      Edrie Hunt, PT PT , DPT PT [phone] 03/04/2024

## 2024-03-11 ENCOUNTER — Ambulatory Visit: Admit: 2024-03-11 | Discharge: 2024-03-11 | Payer: PRIVATE HEALTH INSURANCE | Attending: Anesthesiology

## 2024-03-11 VITALS — BP 153/96 | HR 73 | Temp 98.20000°F | Resp 18 | Ht 62.0 in | Wt 129.0 lb

## 2024-03-11 DIAGNOSIS — M51369 Other intervertebral disc degeneration, lumbar region without mention of lumbar back pain or lower extremity pain: Principal | ICD-10-CM

## 2024-03-11 NOTE — Progress Notes (Signed)
 [facility]        [address]  [place], [place]  [address]  Dept: [phone]    Follow up Note      Maria Valdez     Date of Visit:  03/11/2024    CC:  Patient presents for follow up   Chief Complaint   Patient presents with    Follow-up     RIGHT LUMBAR 4 AND SACRAL 1 TRANSFORAMINAL EPIDURAL STEROID INJECTION UNDER FLUOROSCOPIC GUIDANCE       HPI:  low back pan since Feb 2025.     Low back pain and right LE pain.    Pain is better.  Change in quality of symptoms:yes - improved.    Medication side effects:not applicable .   Recent diagnostic testing:none.   Recent interventional procedures:TFESI on 01/05/2024.excellent.    Nursing notes and details of the pain history reviewed. Please see intake notes for details.     On gabapentin .     Previous treatments:   Physical Therapy : Yes- continues HEP regularly.     Medications: - NSAID's : yes                        - Membrane stabilizers : yes - gabapentin                        - Opioids : no                       - Adjuvants or Others : yes     Spine Surgeries: no     She has not been on anticoagulation medications      She is diabetic.     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 Degenerative disc disease and facet arthropathy.  2. At L5-S1, lobulated right paracentral disc herniation.  Narrowing of the  anterior aspect of the central canal with asymmetric narrowing of the right  subarticular region.  Mild indentation of the anterior aspect of the thecal  sac.  Severe right neural foraminal narrowing with right foraminal disc  herniation.  Mild to moderate left neural foraminal narrowing.  3. Other narrowing as above.     Xray hips: 09/21/2023:  FINDINGS:  AP pelvis with AP frogleg views right hip demonstrate normal alignment  without evidence of fracture or subluxation.  There are mild  osteoarthritic  changes identified of the hips.  The SI joints appear unremarkable.     IMPRESSION:  Mild osteoarthritic changes of the hips.     CT Lumbar: 09/2023:  IMPRESSION:  1. No acute osseous abnormality involving the lumbar spine.  2. Moderate right neural foraminal narrowing at L5/S1.  3. No significant central canal stenosis.  MRI may be warranted for further  evaluation.  4. 2 mm nonobstructing left renal calculus.     OARRS report::Reviewed     Past Medical History:   Diagnosis Date    Diabetes mellitus (HCC)     HIV infection (HCC)     HTN (hypertension) 08/19/2011    Hyperlipidemia     Lumbar pain     Thyroid disease        Past Surgical History:   Procedure Laterality Date  CHOLECYSTECTOMY      COLONOSCOPY      PAIN MANAGEMENT PROCEDURE Right 01/05/2024    RIGHT LUMBAR 4 AND SACRAL 1 TRANSFORAMINAL EPIDURAL STEROID INJECTION UNDER FLUOROSCOPIC GUIDANCE performed by Keitha Trenton NOVAK, MD at [facility]    THYROID SURGERY      TONSILLECTOMY         Prior to Admission medications   Medication Sig Start Date End Date Taking? Authorizing Provider   methIMAzole  (TAPAZOLE ) 10 MG tablet 1 tablets 5 days a week, none on Saturday and Sunday 01/21/24  Yes Janell Lerner, MD   propranolol  (INDERAL  LA) 120 MG extended release capsule Take 1 capsule by mouth daily 01/21/24  Yes Janell Lerner, MD   atorvastatin  (LIPITOR) 20 MG tablet Take 1 tablet by mouth daily 01/21/24  Yes Janell Lerner, MD   ferrous sulfate  (IRON 325) 325 (65 Fe) MG tablet Take 1 tablet by mouth daily (with breakfast) 01/21/24  Yes Janell Lerner, MD   gabapentin  (NEURONTIN ) 300 MG capsule Take 1 capsule by mouth 3 times daily for 90 days. Intended supply: 30 days 12/31/23 03/30/24 Yes Blue Winther, Trenton NOVAK, MD   hydrOXYzine  HCl (ATARAX ) 25 MG tablet Take 1 tablet by mouth every 8 hours as needed for Itching 11/19/23  Yes Janell Lerner, MD   vitamin D  50 MCG (2000 UT) CAPS capsule Take 1 capsule by mouth daily 11/19/23  Yes Janell Lerner, MD   insulin  lispro  (HUMALOG ,ADMELOG ) 100 UNIT/ML SOLN injection vial Via insulin  pump max dose 40 units daily. 06/17/23  Yes Cherylann Lerner LABOR, APRN - CNS   Elviteg-Cobic-Emtricit-TenofAF (GENVOYA) 150-150-200-10 MG TABS Take 1 tablet by mouth daily   Yes [provider]   Cranberry 500 MG CAPS Take 2 capsules by mouth daily  Patient not taking: Reported on 03/11/2024    [provider]   vitamin B-12 (CYANOCOBALAMIN) 1000 MCG tablet Take 1 tablet by mouth daily  Patient not taking: Reported on 03/11/2024    [provider]   Glucagon  (BAQSIMI  ONE PACK) 3 MG/DOSE POWD For severe hypoglycemia  Patient not taking: Reported on 03/11/2024 08/21/23   Cherylann Lerner LABOR, APRN - CNS       Allergies   Allergen Reactions    Sulfa Antibiotics Hives and Swelling    Codeine Nausea Only       Social History     Socioeconomic History    Marital status: Divorced     Spouse name: Not on file    Number of children: Not on file    Years of education: Not on file    Highest education level: Not on file   Occupational History    Not on file   Tobacco Use    Smoking status: Every Day     Current packs/day: 0.50     Average packs/day: 0.5 packs/day for 40.8 years (20.4 ttl pk-yrs)     Types: Cigarettes     Start date: 05/25/1983     Passive exposure: Never    Smokeless tobacco: Never   Vaping Use    Vaping status: Never Used   Substance and Sexual Activity    Alcohol use: No    Drug use: No    Sexual activity: Yes     Partners: Male   Other Topics Concern    Not on file   Social History Narrative    Not on file     Social Drivers of Health     Financial Resource Strain: Not on file  Food Insecurity: No Food Insecurity (05/07/2023)    Hunger Vital Sign     Worried About Running Out of Food in the Last Year: Never true     Ran Out of Food in the Last Year: Never true   Transportation Needs: Unmet Transportation Needs (05/07/2023)    PRAPARE - Therapist, art (Medical): Yes     Lack of Transportation (Non-Medical):  Yes   Physical Activity: Not on file   Stress: Not on file   Social Connections: Not on file   Intimate Partner Violence: Not on file   Housing Stability: High Risk (05/07/2023)    Housing Stability Vital Sign     Unable to Pay for Housing in the Last Year: Yes     Number of Times Moved in the Last Year: 0     Homeless in the Last Year: No       Family History   Problem Relation Age of Onset    Coronary Art Dis Mother     Alcohol Abuse Father        REVIEW OF SYSTEMS:     Maedell denies fever/chills, chest pain, shortness of breath, new bowel or bladder complaints. All other review of systems was negative.    PHYSICAL EXAMINATION:      BP (!) 153/96   Pulse 73   Temp 98.2 F (36.8 C) (Infrared)   Resp 18   Ht 1.575 m (5' 2)   Wt 58.5 kg (129 lb)   LMP 06/26/2016   SpO2 98%   BMI 23.59 kg/m     General:       General appearance:  Pleasant and well-hydrated, in no distress and A & O x 3  Build:Normal Weight  Function: Rises from seated position easily and Moves about room without difficulty     HEENT:     Head:normocephalic, atraumatic     Lungs:     Breathing:normal breathing pattern      CVS:     RRR     Abdomen:     Shape:non-distended and normal     Cervical spine:     Inspection:normal  Palpation:tenderness paravertebral muscles, tenderness trapezium, left, right and positive.  Range of motion:Normal flexion, extension, rotation bilaterally and is not painful.  Spurling's: negative bilaterally     Thoracic spine:                Spine inspection:normal   Palpation:No tenderness over the midline and paraspinal area, bilaterally  Range of motion:normal in flexion, extension rotation bilateral and is not painful.     Lumbar spine:     Spine inspection: Normal   Palpation: Tenderness paravertebral muscles Yes bilaterally  Range of motion: Decreased, flexion Decreased, Lateral bending, extension and rotation bilaterally reduced is painful.  Sacroiliac joint tenderness No bilaterally  FABER test: negative  bilaterally  Gaenslen's test:negative bilaterally   Piriformis tenderness: negative bilaterally  SLR : negative bilaterally     Musculoskeletal:     Trigger points - no     Extremities:     Tremors:None bilaterally upper and lower  Edema:no     Neurological:     Sensory: Normal to light touch      Motor:   Right Grip 5/5              Left Grip 5/5               Right Bicep 5/5  Left Bicep 5/5              Right Triceps 5/5       Left Triceps 5/5          Right Deltoid 5/5     Left Deltoid 5/5                  Right Quadriceps 5/5          Left Quadriceps 5/5           Right Gastrocnemius 5/5    Left Gastrocnemius 5/5  Right Ant Tibialis 5/5  Left Ant Tibialis 5/5     Dermatology:     Skin:no rashes or lesions noted     Assessment/Plan:       Diagnosis Orders   1. Degeneration of intervertebral disc of lumbar region, unspecified whether pain present          2. Lumbar radicular pain          3. Lumbosacral spondylosis without myelopathy          4. Smoking history          5. Other specified diabetes mellitus with other specified complication, with long-term current use of insulin  [facility])             58 y.o.  female with h/o low back pain and right lower extremity pain since February 2025.     Acute onset pain.  Has been treated conservatively.     She may have difficulty in doing therapy because of pain.     MRI of the lumbar spine reviewed personally discussed the findings with the patient.  L5-S1 Severe right neural foraminal narrowing with right foraminal disc Herniation.  CT lumbar spine and x-ray of the hip reviewed.  History of smoking+  H/o DM +     On gabapentin  low-dose.  Has discontinued Cymbalta .    S/P TFESI on 01/05/2024.excellent.> 80% relief.   Doing PT. Pain tolerable.     Plan   When pain recurs significantly, Will plan to repeat right lumbar Transforaminal ESI at L5 and S1 under fluoroscopic guidance.  RBA discussed patient agreed proceed.    Has history of diabetes-discussed effect of  interventions on blood sugar level and to keep a close watch after the procedure.       Physical therapy /HEP.    Continue Gabapentin . Can call for refills. ( Has reduced the dose to once or twice a day)    She is not too keen on chronic opioids and I agree.     Discussed importance of smoking cessation spinal and general health.     She will follow-up in 2-3 months.     Counseling : Patient encouraged to stay active and to continue Regular home exercise program as tolerated - stretching / strengthening.     Smoking cessation counseling : yes      Treatment plan discussed with the patient including medication and procedure side effects.     Controlled Substances Monitoring: OARRS reviewed.     Trenton KATHEE Mathieu, MD

## 2024-03-11 NOTE — Progress Notes (Addendum)
 DESERAI CANSLER presents to the [facility] on 03/11/2024. Ardelia is complaining of pain in her low back. The pain is constant. The pain is described as aching, dull, and sharp. Pain is rated on her best day at a 2, on her worst day at a 8, and on average at a 5 on the VAS scale. She took her last dose of Neurontin  today.     Any procedures since your last visit: Yes, with 80 % relief.    Have you done physical therapy for your pain: Yes. Are you doing home exercises: No. How often are you doing home exercises:0 times weekly.    Pacemaker or defibrillator: No    She is not on NSAIDS and is not on anticoagulation medications.    Do you want someone present when the provider examines you? No      Niara's blood pressure was elevated today. She was instructed to contact his primary care provider as soon as possible.    Medication Contract and Consent for Opioid Use Documents Filed        No documents found                    BP (!) 153/96   Pulse 73   Temp 98.2 F (36.8 C) (Infrared)   Resp 18   Ht 1.575 m (5' 2)   Wt 58.5 kg (129 lb)   LMP 06/26/2016   SpO2 98%   BMI 23.59 kg/m      Patient's last menstrual period was 06/26/2016.

## 2024-03-22 MED ORDER — GABAPENTIN 300 MG PO CAPS
300 | ORAL_CAPSULE | Freq: Three times a day (TID) | ORAL | 2 refills | 30.00000 days | Status: DC
Start: 2024-03-22 — End: 2024-07-19

## 2024-03-22 NOTE — Telephone Encounter (Signed)
 Patient calling for refill of Gabapentin  300 mg patient knows script is not due until next week but wanted to make sure it was there for next week. Please advise.     Next appt 11/24  [facility]

## 2024-04-06 ENCOUNTER — Ambulatory Visit: Admit: 2024-04-06 | Discharge: 2024-04-06 | Payer: PRIVATE HEALTH INSURANCE | Attending: "Endocrinology

## 2024-04-06 DIAGNOSIS — E559 Vitamin D deficiency, unspecified: Principal | ICD-10-CM

## 2024-04-06 MED ORDER — INSULIN LISPRO 100 UNIT/ML IJ SOLN
100 | INTRAMUSCULAR | 11 refills | Status: AC
Start: 2024-04-06 — End: ?

## 2024-04-06 MED ORDER — METHIMAZOLE 10 MG PO TABS
10 | ORAL_TABLET | ORAL | 3 refills | Status: AC
Start: 2024-04-06 — End: ?

## 2024-04-06 NOTE — Progress Notes (Unsigned)
 [place] [facility] Department of Endocrinology Diabetes and Metabolism   [address], [place], [place], [address]  Phone: [phone]  Fax: [phone]    Date of Service: 04/06/2024  Primary Care Physician: Janell Lerner, MD   Provider: Andrew Garner Ropes, MD     Reason for the visit:  Dm type 1, Graves disease     History of Present Illness:  The history is provided by the patient. No language interpreter was used. Accuracy of the patient data is excellent.  Maria Valdez is a very pleasant 58 y.o. female seen today for diabetes management     Maria Valdez is a very pleasant 58 y.o. old female with PMH of HTN, hyperthyroidism, type I diabetes mellitus seen today for a follow-up visit.    The patient currently using Medtronic 780 G insulin  pump with the Guardian CGM.  Her current insulin  pump settings including basal rate 0.95, carb ratio 13, insulin  sensitivity 57, glucose target 100-150, active insulin  time 2 hours, smart guard target 100    A1c 8 point  Lab Results   Component Value Date/Time    LABA1C 7.6 12/03/2023 12:14 PM    LABA1C 12.7 05/08/2023 03:15 AM     The patient reports microvascular diabetes complications.  She is due with diabetic eye exam.    PAST MEDICAL HISTORY   Past Medical History:   Diagnosis Date    Diabetes mellitus (HCC)     HIV infection (HCC)     HTN (hypertension) 08/19/2011    Hyperlipidemia     Lumbar pain     Thyroid disease        PAST SURGICAL HISTORY   Past Surgical History:   Procedure Laterality Date    CHOLECYSTECTOMY      COLONOSCOPY      PAIN MANAGEMENT PROCEDURE Right 01/05/2024    RIGHT LUMBAR 4 AND SACRAL 1 TRANSFORAMINAL EPIDURAL STEROID INJECTION UNDER FLUOROSCOPIC GUIDANCE performed by Keitha Trenton NOVAK, MD at [facility] OR    THYROID SURGERY      TONSILLECTOMY         SOCIAL HISTORY   Tobacco:   reports that she has been smoking cigarettes. She started smoking about 40 years ago. She has a 20.4 pack-year smoking history. She has  never been exposed to tobacco smoke. She has never used smokeless tobacco.  Alcohol:   reports no history of alcohol use.  Drugs:   reports no history of drug use.    FAMILY HISTORY   Family History   Problem Relation Age of Onset    Coronary Art Dis Mother     Alcohol Abuse Father        ALLERGIES AND DRUG REACTIONS   Allergies   Allergen Reactions    Sulfa Antibiotics Hives and Swelling    Codeine Nausea Only       CURRENT MEDICATIONS   Current Outpatient Medications   Medication Sig Dispense Refill    gabapentin  (NEURONTIN ) 300 MG capsule Take 1 capsule by mouth 3 times daily for 90 days. Intended supply: 30 days 90 capsule 2    methIMAzole  (TAPAZOLE ) 10 MG tablet 1 tablets 5 days a week, none on Saturday and Sunday 90 tablet 3    propranolol  (INDERAL  LA) 120 MG extended release capsule Take 1 capsule by mouth daily 30 capsule 3    atorvastatin  (LIPITOR) 20 MG tablet Take 1 tablet by mouth daily 30 tablet 3    ferrous sulfate  (IRON 325)  325 (65 Fe) MG tablet Take 1 tablet by mouth daily (with breakfast) 30 tablet 3    hydrOXYzine  HCl (ATARAX ) 25 MG tablet Take 1 tablet by mouth every 8 hours as needed for Itching 30 tablet 1    vitamin D  50 MCG (2000 UT) CAPS capsule Take 1 capsule by mouth daily 30 capsule 3    insulin  lispro (HUMALOG ,ADMELOG ) 100 UNIT/ML SOLN injection vial Via insulin  pump max dose 40 units daily. 20 mL 5    Elviteg-Cobic-Emtricit-TenofAF (GENVOYA) 150-150-200-10 MG TABS Take 1 tablet by mouth daily      Cranberry 500 MG CAPS Take 2 capsules by mouth daily (Patient not taking: Reported on 04/06/2024)      vitamin B-12 (CYANOCOBALAMIN) 1000 MCG tablet Take 1 tablet by mouth daily (Patient not taking: Reported on 04/06/2024)      Glucagon  (BAQSIMI  ONE PACK) 3 MG/DOSE POWD For severe hypoglycemia (Patient not taking: Reported on 04/06/2024) 1 each 1     No current facility-administered medications for this visit.       Review of Systems  Constitutional: No fever, no chills, no diaphoresis, no  generalized weakness.  HEENT: No blurred vision, No sore throat, no ear pain, no hair loss  Neck: denied any neck swelling, difficulty swallowing,   Cardio-pulmonary: No CP, SOB or palpitation, No orthopnea or PND. No cough or wheezing.  GI: No N/V/D, no constipation, No abdominal pain, no melena or hematochezia   GU: Denied any dysuria, hematuria, flank pain, discharge, or incontinence.   Skin: denied any rash, ulcer, Hirsute, or hyperpigmentation.   MSK: denied any joint deformity, joint pain/swelling, muscle pain, or back pain.  Neuro: no numbness, no tingling, no weakness, _    OBJECTIVE    BP 136/83   Pulse 75   Temp 97.4 F (36.3 C) (Temporal)   Resp 18   Ht 1.575 m (5' 2)   Wt 59 kg (130 lb)   LMP 06/26/2016   SpO2 99%   BMI 23.78 kg/m   BP Readings from Last 4 Encounters:   04/06/24 136/83   03/11/24 (!) 153/96   01/21/24 (!) 100/54   01/05/24 (!) 153/85     Wt Readings from Last 6 Encounters:   04/06/24 59 kg (130 lb)   03/11/24 58.5 kg (129 lb)   01/21/24 58.7 kg (129 lb 6.4 oz)   01/01/24 59 kg (130 lb)   12/31/23 59 kg (130 lb)   12/03/23 59.4 kg (131 lb)       Physical examination:  General: awake alert, oriented x3, no abnormal position or movements.  HEENT: normocephalic non-traumatic, no exophthalmos   Neck: supple, no LN enlargement, no thyromegaly, no thyroid tenderness, no JVD.  Pulm: Clear equal air entry no added sounds, no wheezing or rhonchi    CVS: S1 + S2, no murmur, no heave. Dorsalis pedis pulse palpable   Abd: soft lax, no tenderness, no organomegaly, audible bowel sounds.   Skin: warm, no lesions, no rash. No callus, no Ulcers, No acanthosis nigricans  Musculoskeletal: No back tenderness, no kyphosis/scoliosis    Neuro: CN intact, Monofilament sensation decreased bilateral , muscle power normal  Psych: normal mood, and affect      Review of Laboratory Data:  I personally reviewed the following lab:  Lab Results   Component Value Date/Time    WBC 6.8 11/19/2023 03:33 PM    RBC  4.46 11/19/2023 03:33 PM    HGB 13.7 11/19/2023 03:33 PM    HCT 40.8  11/19/2023 03:33 PM    MCV 91.5 11/19/2023 03:33 PM    MCH 30.7 11/19/2023 03:33 PM    MCHC 33.6 11/19/2023 03:33 PM    RDW 12.9 11/19/2023 03:33 PM    PLT 321 11/19/2023 03:33 PM    MPV 10.7 11/19/2023 03:33 PM      Lab Results   Component Value Date/Time    NA 140 11/19/2023 03:33 PM    K 3.8 11/19/2023 03:33 PM    CO2 24 11/19/2023 03:33 PM    BUN 11 11/19/2023 03:33 PM    CREATININE 0.5 11/19/2023 03:33 PM    CALCIUM  9.5 11/19/2023 03:33 PM    LABGLOM >90 11/19/2023 03:33 PM    GFRAA >60 09/09/2018 08:42 PM      Lab Results   Component Value Date/Time    TSH 2.57 09/10/2023 12:00 AM    T4FREE 1.3 09/10/2023 12:00 AM    T3TOTAL 88 05/08/2023 03:15 AM     Lab Results   Component Value Date/Time    LABA1C 7.6 12/03/2023 12:14 PM    GLUCOSE 224 11/19/2023 03:33 PM    GLUCOSE 90 09/24/2010 11:30 AM     Lab Results   Component Value Date/Time    LABA1C 7.6 12/03/2023 12:14 PM    LABA1C 12.7 05/08/2023 03:15 AM     Lab Results   Component Value Date/Time    TRIG 91 11/19/2023 03:33 PM    HDL 73 11/19/2023 03:33 PM    CHOL 185 11/19/2023 03:33 PM     Lab Results   Component Value Date/Time    VITD25 39.3 11/19/2023 03:33 PM    VITD25 11 09/24/2010 11:30 AM       ASSESSMENT & RECOMMENDATIONS   Maria Valdez, a 58 y.o.-old female seen in for the following issues       Assessment:     {No diagnosis found. 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  Currently using Medtronic 780 G insulin  pump with the Guardian CGM.  Will continue the current insulin  pump settings. basal rate 0.95, carb ratio 13, insulin  sensitivity 57, glucose target 100-150, active insulin  time 2 hours, smart guard target 120.  Most recent A1c 9.6% down from 12.7%  Continue using the CGM.  Will obtain lab before next appointment   2. Graves disease   At goal, we will  continue methimazole   10 mg 1 tablet 5 days a week and then on weekends  Pt s/p partial thyroidectomy long time ago (~ 30 years ago)   Obtain TFTs in 10 to 12 weeks  Discussed the side effects of methimazole  in details with the patient     3. Chronic fatigue   Feels better now.  Previous blood work showed a normal cortisol level   4. Vitamin D  deficiency   On vitamin d  supplementation     I personally reviewed external notes from PCP and other patient's care team providers, and personally interpreted labs associated with the above diagnosis. I also ordered labs to further assess and manage the above addressed medical conditions    No follow-ups on file.    The above issues were reviewed with the patient who understood and agreed with the plan.    Thank you for allowing us  to participate in the care of this patient. Please do not hesitate to contact us  with any  additional questions.     Azizah Lisle Garner Ropes, MD     [facility]   [address], [address], [place], [place], [address]  Phone: [phone]  Fax: [phone]  ------------------------------  An electronic signature was used to authenticate this note. Andrew Garner Ropes, MD on 04/06/2024 at 2:42 PM

## 2024-05-21 LAB — HM DIABETES EYE EXAM: Diabetic Retinopathy: NEGATIVE

## 2024-06-04 ENCOUNTER — Encounter

## 2024-06-07 ENCOUNTER — Ambulatory Visit: Payer: PRIVATE HEALTH INSURANCE | Attending: Anesthesiology

## 2024-06-07 MED ORDER — VITAMIN D3 50 MCG (2000 UT) PO CAPS
50 | ORAL_CAPSULE | Freq: Every day | ORAL | 3 refills | 30.00000 days | Status: AC
Start: 2024-06-07 — End: ?

## 2024-07-14 ENCOUNTER — Emergency Department: Admit: 2024-07-14 | Payer: PRIVATE HEALTH INSURANCE

## 2024-07-14 ENCOUNTER — Inpatient Hospital Stay
Admit: 2024-07-14 | Discharge: 2024-07-14 | Disposition: A | Discharge status: Home / Self Care | Payer: PRIVATE HEALTH INSURANCE | Arrived: AM | Attending: Emergency Medicine

## 2024-07-14 LAB — EKG 12-LEAD
Atrial Rate: 59 {beats}/min
P Axis: 60 degrees
P-R Interval: 154 ms
Q-T Interval: 442 ms
QRS Duration: 74 ms
QTc Calculation (Bazett): 437 ms
R Axis: 26 degrees
T Axis: 61 degrees
Ventricular Rate: 59 {beats}/min

## 2024-07-14 MED ORDER — KETOROLAC TROMETHAMINE 15 MG/ML IJ SOLN
15 | Freq: Once | INTRAMUSCULAR | Status: AC
Start: 2024-07-14 — End: 2024-07-14
  Administered 2024-07-14: 15:00:00 15 mg via INTRAVENOUS

## 2024-07-14 MED ORDER — MORPHINE SULFATE (PF) 4 MG/ML IJ SOLN
4 | INTRAMUSCULAR | Status: AC
Start: 2024-07-14 — End: 2024-07-14
  Administered 2024-07-14: 15:00:00 4 mg via INTRAVENOUS

## 2024-07-14 MED ORDER — NAPROXEN 500 MG PO TABS
500 | ORAL_TABLET | Freq: Two times a day (BID) | ORAL | 0 refills | 20.00000 days | Status: AC | PRN
Start: 2024-07-14 — End: ?

## 2024-07-14 MED FILL — MORPHINE SULFATE 4 MG/ML IJ SOLN: 4 mg/mL | INTRAMUSCULAR | Qty: 1 | Fill #0

## 2024-07-14 MED FILL — KETOROLAC TROMETHAMINE 15 MG/ML IJ SOLN: 15 mg/mL | INTRAMUSCULAR | Qty: 1 | Fill #0

## 2024-07-14 NOTE — ED Provider Notes (Signed)
 [facility] EMERGENCY DEPARTMENT  EMERGENCY DEPARTMENT ENCOUNTER      Pt Name: Maria Valdez  MRN: [id number]  Birthdate 1966-04-17  Date of evaluation: 07/14/2024  Provider: Tana JONELLE Slight, MD     CHIEF COMPLAINT       Chief Complaint   Patient presents with    Fall     Mechanical fall on ice, landed on buttocks, no LOC         HISTORY OF PRESENT ILLNESS   (Location/Symptom, Timing/Onset, Context/Setting, Quality, Duration, Modifying Factors, Severity) Note limiting factors.        HPI    Maria Valdez is a 58 y.o. female who presents to the emergency department low back pain after mechanical fall.  She slipped and fell onto her back.  No head or neck trauma no numbness or weakness send severe pain with any range of motion in the low back now.  History of chronic back pain recently had epidural injection.  Recently had a epidural injection that helped improve symptoms significantly    Nursing Notes were reviewed.    REVIEW OF SYSTEMS    (2+ for level 4; 10+ for level 5)   Review of Systems    PAST MEDICAL HISTORY     Past Medical History:   Diagnosis Date    Diabetes mellitus (HCC)     HIV infection (HCC)     HTN (hypertension) 08/19/2011    Hyperlipidemia     Lumbar pain     Thyroid disease        SURGICAL HISTORY       Past Surgical History:   Procedure Laterality Date    CHOLECYSTECTOMY      COLONOSCOPY      PAIN MANAGEMENT PROCEDURE Right 01/05/2024    RIGHT LUMBAR 4 AND SACRAL 1 TRANSFORAMINAL EPIDURAL STEROID INJECTION UNDER FLUOROSCOPIC GUIDANCE performed by Keitha Trenton NOVAK, MD at [facility] OR    THYROID SURGERY      TONSILLECTOMY         CURRENT MEDICATIONS       Discharge Medication List as of 07/14/2024 12:14 PM        CONTINUE these medications which have NOT CHANGED    Details   VITAMIN D3 50 MCG (2000 UT) CAPS capsule TAKE 1 CAPSULE BY MOUTH EVERY DAY, Disp-30 capsule, R-3Normal      insulin  lispro (HUMALOG ,ADMELOG ) 100 UNIT/ML SOLN injection vial Via insulin  pump max  dose 100 units daily., Disp-39 mL, R-11Normal      methIMAzole  (TAPAZOLE ) 10 MG tablet 1 tablets 5 days a week, none on Saturday and Sunday, Disp-80 tablet, R-3Normal      gabapentin  (NEURONTIN ) 300 MG capsule Take 1 capsule by mouth 3 times daily for 90 days. Intended supply: 30 days, Disp-90 capsule, R-2Normal      propranolol  (INDERAL  LA) 120 MG extended release capsule Take 1 capsule by mouth daily, Disp-30 capsule, R-3Normal      atorvastatin  (LIPITOR) 20 MG tablet Take 1 tablet by mouth daily, Disp-30 tablet, R-3Normal      ferrous sulfate  (IRON 325) 325 (65 Fe) MG tablet Take 1 tablet by mouth daily (with breakfast), Disp-30 tablet, R-3Normal      Cranberry 500 MG CAPS Take 2 capsules by mouth dailyHistorical Med      vitamin B-12 (CYANOCOBALAMIN) 1000 MCG tablet Take 1 tablet by mouth dailyHistorical Med      hydrOXYzine  HCl (ATARAX ) 25 MG tablet Take 1 tablet by mouth every 8  hours as needed for Itching, Disp-30 tablet, R-1Normal      Glucagon  (BAQSIMI  ONE PACK) 3 MG/DOSE POWD For severe hypoglycemia, Disp-1 each, R-1Normal      Elviteg-Cobic-Emtricit-TenofAF (GENVOYA) 150-150-200-10 MG TABS Take 1 tablet by mouth dailyHistorical Med             ALLERGIES     Sulfa antibiotics and Codeine    FAMILY HISTORY       Family History   Problem Relation Age of Onset    Coronary Art Dis Mother     Alcohol Abuse Father         SOCIAL HISTORY       Social History     Socioeconomic History    Marital status: Divorced   Tobacco Use    Smoking status: Every Day     Current packs/day: 0.50     Average packs/day: 0.5 packs/day for 41.2 years (20.6 ttl pk-yrs)     Types: Cigarettes     Start date: 05/25/1983     Passive exposure: Never    Smokeless tobacco: Never   Vaping Use    Vaping status: Never Used   Substance and Sexual Activity    Alcohol use: No    Drug use: No    Sexual activity: Yes     Partners: Male     Social Drivers of Health     Food Insecurity: No Food Insecurity (05/07/2023)    Hunger Vital Sign      Worried About Running Out of Food in the Last Year: Never true     Ran Out of Food in the Last Year: Never true   Transportation Needs: Unmet Transportation Needs (05/07/2023)    PRAPARE - Therapist, Art (Medical): Yes     Lack of Transportation (Non-Medical): Yes   Housing Stability: High Risk (05/07/2023)    Housing Stability Vital Sign     Unable to Pay for Housing in the Last Year: Yes     Number of Times Moved in the Last Year: 0     Homeless in the Last Year: No       SCREENINGS    Glasgow Coma Scale  Eye Opening: Spontaneous  Best Verbal Response: Oriented  Best Motor Response: Obeys commands  Glasgow Coma Scale Score: 15      PHYSICAL EXAM    (up to 7 for level 4, 8 or more for level 5)     ED Triage Vitals [07/14/24 0909]   BP Girls Systolic BP Percentile Girls Diastolic BP Percentile Boys Systolic BP Percentile Boys Diastolic BP Percentile Temp Temp Source Pulse   134/84 -- -- -- -- 98 F (36.7 C) Oral 67      Respirations SpO2 Height Weight - Scale       11 100 % 1.575 m (5' 2) 62.6 kg (138 lb)           Physical Exam  Constitutional:       Comments: Patient is awake alert no acute distress strength sensation globally intact is diffuse lumbar spine tenderness with palpation strength sensation lower extremities intact           DIAGNOSTIC RESULTS     EKG (Per Emergency Physician):       RADIOLOGY (Per Emergency Physician):       Interpretation per the Radiologist below, if available at the time of this note:  No results found.    ED BEDSIDE ULTRASOUND:   Performed  by ED Physician - none    LABS:  Labs Reviewed - No data to display     All other labs were within normal range or not returned as of this dictation.    EMERGENCY DEPARTMENT COURSE and DIFFERENTIAL DIAGNOSIS/MDM:   Vitals:    Vitals:    07/14/24 0909 07/14/24 1104 07/14/24 1223   BP: 134/84     Pulse: 67 67 63   Resp: 11 11 18    Temp: 98 F (36.7 C)     TempSrc: Oral     SpO2: 100% 98% 96%   Weight: 62.6 kg (138 lb)      Height: 1.575 m (5' 2)         Medications   ketorolac  (TORADOL ) injection 15 mg (15 mg IntraVENous Given 07/14/24 1008)   morphine  sulfate (PF) injection 4 mg (4 mg IntraVENous Given 07/14/24 1007)       MDM  .  Patient presents with fall had some low back pain vitals are stable she is neuro intact did not hit her head or neck.  Has mild L-spine tenderness on exam no pain with palpation upper lower extremities pelvis stable and nontender no chest wall tenderness no signs of trauma no neurodeficits abdomen is benign imaging within normal limits plan to discharge.        REVAL:         CONSULTS:  None    PROCEDURES:  Unless otherwise noted below, none     Procedures    Patients symptoms are consistent with sepsis, severe sepsis, or septic shock (If yes use .sepsiscoremeasure):   FINAL IMPRESSION      1. Fall, initial encounter    2. Injury of head, initial encounter          DISPOSITION/PLAN   DISPOSITION Decision To Discharge 07/14/2024 10:33:30 AM   DISPOSITION CONDITION Stable           PATIENT REFERRED TO:  Janell Lerner, MD  [address]  [place] [address]  [phone]    Schedule an appointment as soon as possible for a visit in 2 days        DISCHARGE MEDICATIONS:  Discharge Medication List as of 07/14/2024 12:14 PM        START taking these medications    Details   naproxen  (NAPROSYN ) 500 MG tablet Take 1 tablet by mouth 2 times daily as needed for Pain, Disp-60 tablet, R-0Normal                (Please note:  Portions of this note were completed with a voice recognition program.  Efforts were made to edit the dictations but occasionally words and phrases are mis-transcribed.)  Form [id number].J.[id number]    Tana JONELLE Slight, MD (electronically signed)  Emergency Medicine Provider       Slight Tana JONELLE, MD  07/23/24 2123

## 2024-07-19 MED ORDER — GABAPENTIN 300 MG PO CAPS
300 | ORAL_CAPSULE | Freq: Three times a day (TID) | ORAL | 0 refills | 30.00000 days | Status: DC
Start: 2024-07-19 — End: 2024-08-16

## 2024-07-19 NOTE — Telephone Encounter (Signed)
"  Maria Valdez called asking for a refill on gabapentin  her next office visit is 08/16/24.  "

## 2024-08-03 LAB — VITAMIN D 25 HYDROXY: Vit D, 25-Hydroxy: 35

## 2024-08-03 LAB — COMPREHENSIVE METABOLIC PANEL
Chloride: 105 mmol/L
Potassium: 3.9 mmol/L
Sodium: 140 mmol/L

## 2024-08-03 LAB — BASIC METABOLIC PANEL

## 2024-08-03 LAB — LIPID PANEL
Cholesterol, Total: 201 mg/dL
HDL: 84 mg/dL — AB (ref 35–70)
LDL Cholesterol: 100

## 2024-08-03 LAB — HEMOGLOBIN A1C: Hemoglobin A1C: 7.8 %

## 2024-08-03 LAB — TSH: TSH: 4.05 u[IU]/mL

## 2024-08-12 ENCOUNTER — Encounter

## 2024-08-12 ENCOUNTER — Encounter: Payer: PRIVATE HEALTH INSURANCE | Attending: "Endocrinology

## 2024-08-12 NOTE — Progress Notes (Signed)
 Lab work

## 2024-08-16 ENCOUNTER — Ambulatory Visit: Admit: 2024-08-16 | Discharge: 2024-08-16 | Payer: PRIVATE HEALTH INSURANCE | Attending: Anesthesiology

## 2024-08-16 DIAGNOSIS — M51369 Other intervertebral disc degeneration, lumbar region without mention of lumbar back pain or lower extremity pain: Principal | ICD-10-CM

## 2024-08-16 MED ORDER — NABUMETONE 750 MG PO TABS
750 | ORAL_TABLET | Freq: Two times a day (BID) | ORAL | 1 refills | 90.00000 days | Status: AC | PRN
Start: 2024-08-16 — End: ?

## 2024-08-16 MED ORDER — GABAPENTIN 300 MG PO CAPS
300 | ORAL_CAPSULE | Freq: Three times a day (TID) | ORAL | 1 refills | 30.00000 days | Status: AC
Start: 2024-08-16 — End: 2024-11-14

## 2024-08-16 NOTE — Progress Notes (Signed)
 Maria Valdez presents to the [facility] on 08/16/2024. [name] is complaining of pain back . The pain is constant. The pain is described as aching. Pain is rated on her best day at a 3, on her worst day at a 9, and on average at a 6 on the VAS scale. She took her last dose of naproxen , gabapentin ,  last night .     Any procedures since your last visit: Yes, with not sure  % relief.patient is unsure what percent     Have you done physical therapy for your pain: Yes. Are you doing home exercises: Yes. How often are you doing home exercises:3 times weekly.    Pacemaker or defibrillator: No     She is  on NSAIDS and is not on anticoagulation medications to include none and is managed by Lucie Search .     Do you want someone present when the provider examines you? No    Medication Contract and Consent for Opioid Use Documents Filed        No documents found                    LMP 06/26/2016      Patient's last menstrual period was 06/26/2016.

## 2024-08-17 ENCOUNTER — Encounter

## 2024-08-17 MED ORDER — FEROSUL 325 (65 FE) MG PO TABS
325 | ORAL_TABLET | Freq: Every day | ORAL | 1 refills | 30.00000 days | Status: AC
Start: 2024-08-17 — End: ?

## 2024-08-17 MED ORDER — PROPRANOLOL HCL ER 120 MG PO CP24
120 | ORAL_CAPSULE | Freq: Every day | ORAL | 1 refills | 90.00000 days | Status: AC
Start: 2024-08-17 — End: ?

## 2024-08-17 MED ORDER — ATORVASTATIN CALCIUM 20 MG PO TABS
20 | ORAL_TABLET | Freq: Every day | ORAL | 1 refills | 90.00000 days | Status: AC
Start: 2024-08-17 — End: ?

## 2024-08-17 NOTE — Telephone Encounter (Signed)
 Name of Medication(s) Requested:  Requested Prescriptions     Pending Prescriptions Disp Refills    FEROSUL 325 (65 Fe) MG tablet [Pharmacy Med Name: FeroSul 325 mg (65 mg iron) tablet] 90 tablet 1     Sig: TAKE 1 TABLET BY MOUTH EVERY DAY WITH BREAKFAST    propranolol  (INDERAL  LA) 120 MG extended release capsule [Pharmacy Med Name: propranolol  ER 120 mg capsule,24 hr,extended release] 90 capsule 1     Sig: TAKE 1 CAPSULE BY MOUTH EVERY DAY    atorvastatin  (LIPITOR) 20 MG tablet [Pharmacy Med Name: atorvastatin  20 mg tablet] 90 tablet 1     Sig: Take 1 tablet by mouth daily       Medication is on current medication list Yes    Dosage and directions were verified? Yes    Quantity verified: 90 day supply     Pharmacy Verified?  Yes    Last Appointment:  01/21/2024    Future appts:  Future Appointments   Date Time Provider Department Center   08/30/2024  1:00 PM Leopoldo Andrew Outhouse, MD [facility]   09/27/2024  3:30 PM Ningegowda, Trenton NOVAK, MD [facility]        (If no appt send self scheduling link. [email or web address])  Scheduling request sent?     []  Yes  [x]  No    Does patient need updated?  []  Yes  [x]  No

## 2024-08-30 ENCOUNTER — Encounter: Payer: PRIVATE HEALTH INSURANCE | Attending: "Endocrinology
